# Patient Record
Sex: Female | Born: 1965 | Race: Black or African American | Hispanic: No | Marital: Married | Smoking: Never smoker
Health system: Southern US, Community
[De-identification: ages and names within clinical notes are randomized; demographics above are authoritative.]

## PROBLEM LIST (undated history)

## (undated) DIAGNOSIS — R569 Unspecified convulsions: Secondary | ICD-10-CM

## (undated) DIAGNOSIS — E0591 Thyrotoxicosis, unspecified with thyrotoxic crisis or storm: Secondary | ICD-10-CM

## (undated) DIAGNOSIS — E0590 Thyrotoxicosis, unspecified without thyrotoxic crisis or storm: Secondary | ICD-10-CM

## (undated) DIAGNOSIS — N838 Other noninflammatory disorders of ovary, fallopian tube and broad ligament: Secondary | ICD-10-CM

## (undated) DIAGNOSIS — G43019 Migraine without aura, intractable, without status migrainosus: Secondary | ICD-10-CM

## (undated) DIAGNOSIS — R42 Dizziness and giddiness: Secondary | ICD-10-CM

## (undated) DIAGNOSIS — I10 Essential (primary) hypertension: Secondary | ICD-10-CM

## (undated) DIAGNOSIS — G40909 Epilepsy, unspecified, not intractable, without status epilepticus: Principal | ICD-10-CM

## (undated) HISTORY — DX: Migraine without aura, intractable, without status migrainosus: G43.019

## (undated) HISTORY — PX: ABDOMINAL HYSTERECTOMY: SHX81

## (undated) HISTORY — PX: OVARIAN CYST SURGERY: SHX726

## (undated) HISTORY — DX: Epilepsy, unspecified, not intractable, without status epilepticus: G40.909

---

## 1997-12-19 ENCOUNTER — Inpatient Hospital Stay (HOSPITAL_COMMUNITY): Admission: AD | Admit: 1997-12-19 | Discharge: 1997-12-19 | Payer: Self-pay | Admitting: Obstetrics & Gynecology

## 1997-12-22 ENCOUNTER — Observation Stay (HOSPITAL_COMMUNITY): Admission: AD | Admit: 1997-12-22 | Discharge: 1997-12-23 | Payer: Self-pay | Admitting: Obstetrics and Gynecology

## 2000-01-28 ENCOUNTER — Emergency Department (HOSPITAL_COMMUNITY): Admission: EM | Admit: 2000-01-28 | Discharge: 2000-01-28 | Payer: Self-pay | Admitting: Emergency Medicine

## 2000-05-17 ENCOUNTER — Other Ambulatory Visit: Admission: RE | Admit: 2000-05-17 | Discharge: 2000-05-17 | Payer: Self-pay | Admitting: Family Medicine

## 2000-07-10 ENCOUNTER — Emergency Department (HOSPITAL_COMMUNITY): Admission: EM | Admit: 2000-07-10 | Discharge: 2000-07-11 | Payer: Self-pay | Admitting: Emergency Medicine

## 2002-10-09 ENCOUNTER — Other Ambulatory Visit: Admission: RE | Admit: 2002-10-09 | Discharge: 2002-10-09 | Payer: Self-pay | Admitting: Family Medicine

## 2003-04-25 ENCOUNTER — Emergency Department (HOSPITAL_COMMUNITY): Admission: EM | Admit: 2003-04-25 | Discharge: 2003-04-25 | Payer: Self-pay | Admitting: Emergency Medicine

## 2005-02-10 ENCOUNTER — Emergency Department (HOSPITAL_COMMUNITY): Admission: EM | Admit: 2005-02-10 | Discharge: 2005-02-10 | Payer: Self-pay | Admitting: Emergency Medicine

## 2005-03-13 ENCOUNTER — Encounter: Admission: RE | Admit: 2005-03-13 | Discharge: 2005-03-13 | Payer: Self-pay | Admitting: Family Medicine

## 2006-05-10 ENCOUNTER — Other Ambulatory Visit: Admission: RE | Admit: 2006-05-10 | Discharge: 2006-05-10 | Payer: Self-pay | Admitting: Family Medicine

## 2007-08-15 ENCOUNTER — Other Ambulatory Visit: Admission: RE | Admit: 2007-08-15 | Discharge: 2007-08-15 | Payer: Self-pay | Admitting: Family Medicine

## 2007-09-12 ENCOUNTER — Encounter: Admission: RE | Admit: 2007-09-12 | Discharge: 2007-09-12 | Payer: Self-pay | Admitting: Family Medicine

## 2007-12-10 ENCOUNTER — Encounter: Admission: RE | Admit: 2007-12-10 | Discharge: 2007-12-10 | Payer: Self-pay | Admitting: Family Medicine

## 2008-01-07 ENCOUNTER — Inpatient Hospital Stay (HOSPITAL_COMMUNITY): Admission: RE | Admit: 2008-01-07 | Discharge: 2008-01-11 | Payer: Self-pay | Admitting: Obstetrics and Gynecology

## 2008-01-07 ENCOUNTER — Encounter (INDEPENDENT_AMBULATORY_CARE_PROVIDER_SITE_OTHER): Payer: Self-pay | Admitting: Obstetrics and Gynecology

## 2008-09-14 ENCOUNTER — Encounter: Admission: RE | Admit: 2008-09-14 | Discharge: 2008-09-14 | Payer: Self-pay | Admitting: Family Medicine

## 2009-04-24 ENCOUNTER — Emergency Department (HOSPITAL_COMMUNITY): Admission: EM | Admit: 2009-04-24 | Discharge: 2009-04-24 | Payer: Self-pay | Admitting: Emergency Medicine

## 2009-09-17 ENCOUNTER — Encounter: Admission: RE | Admit: 2009-09-17 | Discharge: 2009-09-17 | Payer: Self-pay | Admitting: Obstetrics and Gynecology

## 2010-06-26 LAB — DIFFERENTIAL
Basophils Absolute: 0 10*3/uL (ref 0.0–0.1)
Basophils Relative: 0 % (ref 0–1)
Eosinophils Absolute: 0 10*3/uL (ref 0.0–0.7)
Eosinophils Relative: 1 % (ref 0–5)
Lymphocytes Relative: 5 % — ABNORMAL LOW (ref 12–46)
Monocytes Relative: 4 % (ref 3–12)
Neutro Abs: 8.9 10*3/uL — ABNORMAL HIGH (ref 1.7–7.7)
Neutrophils Relative %: 91 % — ABNORMAL HIGH (ref 43–77)

## 2010-06-26 LAB — COMPREHENSIVE METABOLIC PANEL
ALT: 11 U/L (ref 0–35)
AST: 17 U/L (ref 0–37)
Alkaline Phosphatase: 42 U/L (ref 39–117)
CO2: 24 mEq/L (ref 19–32)
Calcium: 8.9 mg/dL (ref 8.4–10.5)
Chloride: 104 mEq/L (ref 96–112)
Creatinine, Ser: 0.73 mg/dL (ref 0.4–1.2)
GFR calc Af Amer: >60 mL/min (ref >60)
GFR calc non Af Amer: >60 mL/min (ref >60)
Glucose, Bld: 97 mg/dL (ref 70–99)
Potassium: 3.4 mEq/L — ABNORMAL LOW (ref 3.5–5.1)
Sodium: 135 mEq/L (ref 135–145)
Total Bilirubin: 1.1 mg/dL (ref 0.3–1.2)
Total Protein: 7.8 g/dL (ref 6.0–8.3)

## 2010-06-26 LAB — CBC
HCT: 41.7 % (ref 36.0–46.0)
Hemoglobin: 13.9 g/dL (ref 12.0–15.0)
MCV: 94 fL (ref 78.0–100.0)
RBC: 4.44 MIL/uL (ref 3.87–5.11)
WBC: 9.8 10*3/uL (ref 4.0–10.5)

## 2010-07-23 ENCOUNTER — Emergency Department (HOSPITAL_COMMUNITY): Payer: 59

## 2010-07-23 ENCOUNTER — Observation Stay (HOSPITAL_COMMUNITY)
Admission: EM | Admit: 2010-07-23 | Discharge: 2010-07-25 | Disposition: A | Discharge status: Home / Self Care | Payer: 59 | Attending: General Surgery | Admitting: General Surgery

## 2010-07-23 DIAGNOSIS — R109 Unspecified abdominal pain: Secondary | ICD-10-CM | POA: Insufficient documentation

## 2010-07-23 DIAGNOSIS — R1031 Right lower quadrant pain: Principal | ICD-10-CM | POA: Insufficient documentation

## 2010-07-23 DIAGNOSIS — R110 Nausea: Secondary | ICD-10-CM | POA: Insufficient documentation

## 2010-07-23 LAB — CBC
HCT: 35.8 % — ABNORMAL LOW (ref 36.0–46.0)
Hemoglobin: 12.1 g/dL (ref 12.0–15.0)
MCHC: 33.8 g/dL (ref 30.0–36.0)
Platelets: 172 10*3/uL (ref 150–400)
RBC: 3.89 MIL/uL (ref 3.87–5.11)
RDW: 12.6 % (ref 11.5–15.5)
WBC: 5.7 10*3/uL (ref 4.0–10.5)

## 2010-07-23 LAB — URINALYSIS, ROUTINE W REFLEX MICROSCOPIC
Bilirubin Urine: NEGATIVE
Glucose, UA: NEGATIVE mg/dL
Leukocytes, UA: NEGATIVE
Nitrite: NEGATIVE
Protein, ur: NEGATIVE mg/dL
Specific Gravity, Urine: 1.01 (ref 1.005–1.030)
Urobilinogen, UA: 0.2 mg/dL (ref 0.0–1.0)

## 2010-07-23 LAB — BASIC METABOLIC PANEL
BUN: 12 mg/dL (ref 6–23)
CO2: 24 mEq/L (ref 19–32)
Calcium: 8.3 mg/dL — ABNORMAL LOW (ref 8.4–10.5)
Chloride: 99 mEq/L (ref 96–112)
Creatinine, Ser: 0.73 mg/dL (ref 0.4–1.2)
GFR calc Af Amer: >60 mL/min (ref >60)
GFR calc non Af Amer: >60 mL/min (ref >60)
Glucose, Bld: 106 mg/dL — ABNORMAL HIGH (ref 70–99)
Potassium: 3.5 mEq/L (ref 3.5–5.1)
Sodium: 137 mEq/L (ref 135–145)

## 2010-07-23 LAB — DIFFERENTIAL
Basophils Absolute: 0 10*3/uL (ref 0.0–0.1)
Eosinophils Absolute: 0.1 10*3/uL (ref 0.0–0.7)
Eosinophils Relative: 1 % (ref 0–5)
Lymphocytes Relative: 19 % (ref 12–46)
Lymphs Abs: 1.1 10*3/uL (ref 0.7–4.0)
Monocytes Relative: 10 % (ref 3–12)
Neutro Abs: 4 10*3/uL (ref 1.7–7.7)

## 2010-07-23 LAB — POCT PREGNANCY, URINE: Preg Test, Ur: NEGATIVE

## 2010-07-23 LAB — URINE MICROSCOPIC-ADD ON

## 2010-07-23 MED ORDER — IOHEXOL 300 MG/ML  SOLN
100.0000 mL | Freq: Once | INTRAMUSCULAR | Status: AC | PRN
  Administered 2010-07-23: 100 mL via INTRAVENOUS

## 2010-07-24 ENCOUNTER — Observation Stay (HOSPITAL_COMMUNITY): Payer: 59

## 2010-08-02 NOTE — H&P (Signed)
Joanna, [name]                ACCOUNT NO.:  [id number]  MEDICAL RECORD NO.:  [id number]           PATIENT TYPE:  O  LOCATION:  A214                          FACILITY:  [facility]  PHYSICIAN:  Tilford Pillar, MD      DATE OF BIRTH:  10/10/1965  DATE OF ADMISSION:  07/23/2010 DATE OF DISCHARGE:  04/16/2012LH                             HISTORY & PHYSICAL   CHIEF COMPLAINT:  Lower abdominal pain.  HISTORY OF PRESENT ILLNESS:  The patient is a 45 year old female otherwise healthy who presented to [facility] with increasing lower abdominal pain.  This has been happening over approximately the last week, but has increased over the last 24 hours. The pain is described as left lower quadrants and suprapubic, has been associated with nausea and emesis, emesis has been nonbloody.  She has had no change in bowel movements.  No melena.  No hematochezia.  She describes the pain as dull, occasionally crampy.  It is worse and exacerbated with ambulation, has not significantly changed with diet or other activities.  She has not noticed any fever or chills.  She has not had any significant anorexia, although she has been limited due to her nausea and vomiting.  PAST MEDICAL HISTORY:  None.  PAST SURGICAL HISTORY:  She is had a previous total abdominal hysterectomy and left salpingo-oophorectomy.  MEDICATIONS:  None.  ALLERGIES:  No known drug allergies.  SOCIAL HISTORY:  No tobacco, no alcohol, no recreational drug abuse.  FAMILY HISTORY:  Noncontributory.  REVIEW OF SYSTEMS:  CONSTITUTIONAL :  Unremarkable.  EYES: Unremarkable.  EARS, NOSE, AND THROAT:  Unremarkable.  RESPIRATORY: Unremarkable.  CARDIOVASCULAR:  Unremarkable.  GASTROINTESTINAL:  As per HPI.  GENITOURINARY:  Unremarkable.  MUSCULOSKELETAL:  Unremarkable. NEURO, ENDOCRINE, AND SKIN:  All unremarkable.  PHYSICAL EXAMINATION:  VITAL SIGNS:  Temperature 98.4, heart rate 85, respirations 18, blood  pressure 125/81.  She is 99% O2 saturation on room air. GENERAL:  The patient is lying in a supine position on the hospital bed. She is not in any acute distress, but she appears uncomfortable.  She is alert and oriented x3.  She is pleasant.  She is again not in any acute distress. HEENT:  Scalp no deformities or masses. EYES:  Pupils equal, round, and reactive.  Extraocular movements are intact.  No scleral icterus.  No conjunctival pallor is noted.  Oral mucosa is pink.  Normal occlusion. NECK:  Trachea is midline.  No cervical lymphadenopathy. PULMONARY:  Unlabored respiration.  She is clear to auscultation bilaterally. CARDIOVASCULAR:  Regular rate and rhythm.  No murmurs or gallops.  She has 2+ radial and femoral pulses bilaterally. ABDOMEN:  Positive bowel sounds.  Abdomen is soft, flat.  She has moderate suprapubic and left-sided abdominal pain, slight right-sided abdominal pain.  There is no rebound.  There is no Rovsing's sign.  No significant voluntary guarding.  No hernias or masses are apparent. EXTREMITIES:  Warm and dry.  PERTINENT LABORATORY AND RADIOGRAPHIC STUDIES:  CBC, white blood cell count 5.7, hemoglobin 12.1, hematocrit 35.8, platelets 172.  Basic metabolic panel is within normal limits.  Sodium 137, potassium 3.5, chloride 99, bicarb 24, BUN 12, creatinine 0.73.  Urinalysis was within normal limits.  CT of the abdomen and pelvis demonstrates no evidence any free air or free fluid.  There is a notable mass within the right lower quadrant initially interpreted by the radiologist as early appendicitis or potential appendiceal mucocele.  On further discussion with the on-call radiologist, I determine this is likely the patient's right ovary, and the appendix is noted and is noted to be normal in appearance.  ASSESSMENT AND PLAN:  Abdominal pain, unspecified.  Plan at this point is to admit the patient, continue her initially on bowel rest, continue IV fluid  resuscitation with IV fluid hydration.  We will continue on pain medications and we will closely monitor the patient for continued workup for her abdominal pain.  Additionally, the patient had some stools in the colon, and GI washout will be performed to see if some of her symptomatology is contributed to obstipation.     Tilford Pillar, MD     BZ/MEDQ  D:  07/25/2010  T:  07/25/2010  Job:  [id number]  Electronically Signed by Tilford Pillar MD on 08/02/2010 01:55:05 PM

## 2010-08-02 NOTE — Discharge Summary (Signed)
  NAMEKENSLEIGH, GATES                ACCOUNT NO.:  [id number]  MEDICAL RECORD NO.:  [id number]           PATIENT TYPE:  O  LOCATION:  A214                          FACILITY:  [facility]  PHYSICIAN:  Tilford Pillar, MD      DATE OF BIRTH:  [birth year]  DATE OF ADMISSION:  07/23/2010 DATE OF DISCHARGE:  04/16/2012LH                              DISCHARGE SUMMARY   ADMISSION DIAGNOSES:  Abdominal pain, unspecified.  DISCHARGE DIAGNOSES:  Resolving abdominal pain.  PROCEDURES:  None.  ADMITTING SURGEON:  Tilford Pillar, MD  DISPOSITION:  Home.  BRIEF H AND P:  Please see admission history of physical for the complete H and P.  The patient is a 45 year old female who presented to [facility] with lower abdominal pain, left greater than right with initial radiographic interpretation suggesting appendiceal origin. She was admitted for continued management and intervention.  HOSPITAL COURSE:  The patient was admitted on July 23, 2010.  She was continued on bowel rest.  She was continued on IV fluid hydration and pain control.  Her symptomatology did not clinically indicate an appendiceal issue, discussion with the on-call radiologist by myself reviewing the CT evaluation was carried out.  After the discussion, we agreed that the findings were likely the patient's right ovary and not associated with any appendiceal origin.  Based on this and some improvement after initial bowel rest and GI washout, I did start the patient back on a clear diet.  This was advanced to a regular diet as tolerated.  She does have moderate amount of discomfort and pain with ambulation similar to her presenting features; however, no catastrophic intra-abdominal findings have been apparent.  Ultrasound was obtained, which again confirmed that the structure in the right lower quadrant was indeed the patient's right ovary and as the patient's symptomatology has been very similar to her symptomatology she had  prior to her removal of her left ovary, I do think that followup as an outpatient with the patient's OB/GYN would be prudent.  This was discussed with the patient and the patient understands and is agreeable to this.  At this time, we will plan to discharge the patient with followup in the next 24 hours with her OB/GYN.  She does understand that should her symptomatology worsen in the interim, she is to return to the hospital for additional evaluation.  DISCHARGE INSTRUCTIONS:  The patient is to increase activity as tolerated.  She may resume normal function as tolerated.  She may resume a normal diet.  She is to return to see her OB/GYN, Dr. Richardson Dopp at [facility] at 2 o'clock on July 26, 2010.  She is to call my office should she have any questions or concerns or problems.  DISCHARGE MEDICATIONS:  Please see the discharge reconciliation sheet for all discharge medications.     Tilford Pillar, MD     BZ/MEDQ  D:  07/25/2010  T:  07/26/2010  Job:  [id number]  Electronically Signed by Tilford Pillar MD on 08/02/2010 01:55:01 PM

## 2010-08-23 NOTE — Op Note (Signed)
NAMEJAYLYNE, BREESE                ACCOUNT NO.:  [id number]   MEDICAL RECORD NO.:  [id number]          PATIENT TYPE:  INP   LOCATION:  9302                          FACILITY:  [facility]   PHYSICIAN:  Gerald Leitz, MD          DATE OF BIRTH:  Mar 06, 1966   DATE OF PROCEDURE:  DATE OF DISCHARGE:                               OPERATIVE REPORT   PREOPERATIVE DIAGNOSES:  1. Menorrhagia.  2. Complex left ovarian cyst.  3. Suspected adenomyosis.   POSTOPERATIVE DIAGNOSES:  1. Menorrhagia.  2. Complex left ovarian cyst.  3. Suspected adenomyosis.  4. Extensive pelvic adhesive disease.   PROCEDURE:  Extensive lysis of adhesions, total abdominal hysterectomy,  and left salpingo-oophorectomy.   SURGEON:  Gerald Leitz, MD   ASSISTANT:  Artist Pais, MD   ANESTHESIA:  General.   FINDINGS:  Adhesions of the uterus to the anterior abdominal wall,  complex cyst on the left ovary, and adhesions of the bladder to the  uterus.   SPECIMEN:  Left fallopian tube and ovary, uterus, and cervix.   DISPOSITION OF SPECIMEN:  Pathology.   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   ESTIMATED BLOOD LOSS:  250 mL.   URINE OUTPUT:  200 mL.   COMPLICATIONS:  None.   PROCEDURE:  The patient was taken to the operating room where she was  placed under general anesthesia.  She was prepped and draped in the  usual sterile fashion.  A Pfannenstiel skin incision was made with a  scalpel and carried down to the underlying layer of fascia.  The fascia  was incised in the midline, and incision was extended laterally with  Mayo scissors.  Superior aspect of the fascial incision was grasped with  Kocher clamps, elevated, and underlying rectus muscles were dissected  off using scalpel and Mayo scissors.  Rectus muscles were densely  adhered in the midline.  They were separated using scalpel and Mayo  scissors.  The peritoneum was identified and entered sharply with  Metzenbaum  scissors.  The patient was noted to have adhesions of the  uterus to the anterior abdominal wall.  This was carefully dissected off  using the electrocautery and Metzenbaum scissors.  Adhesiolysis took  approximately an hour of the case.  Once adhesions were removed and the  uterus was adequately mobilized, the Balfour retractor was placed into  the peritoneal cavity.  Bowel was packed away with moist laparotomy  sponges.  The cornu of the uterus were grasped with Kelly clamps, and  the uterus was elevated.  The round ligaments were identified  bilaterally.  They were suture ligated with 0 Vicryl and then  transected.  The left infundibulopelvic ligament was identified.  The  left ureter was identified.  Once the ureter was identified, the left  infundibulopelvic ligament was clamped with a Heaney clamp, transected,  and suture ligated with a free tie of 0 Vicryl followed by a suture  ligature of 0 Vicryl.  The left ovary and fallopian tube were excised  and sent to pathology  for frozen section.  The right utero-ovarian  ligament was clamped with a Heaney clamp, transected, and suture ligated  with free tie of 0 Vicryl followed by a suture ligature of 0 Vicryl.  The bladder was densely adhesed to the lower uterine segment.  This was  dissected off using Metzenbaum scissors.  The uterine arteries were then  skeletonized bilaterally, clamped with Zeppelin clamps, transected,  suture ligated with 0 Vicryl.  Cardinal ligaments were then clamped with  Heaney clamps, transected, and suture ligated with 0 Vicryl.  Uterosacral ligaments were clamped with Heaney clamps, transected, and  suture ligated with 0 Vicryl bilaterally.  The cervix was amputated from  the anterior aspect of the vagina using Mayo scissors.  The vaginal cuff  was repaired with 0 Vicryl in a running locked fashion.  Angle suture of  0 Vicryl was placed bilaterally.  The abdomen was then copiously  irrigated.  Excellent  hemostasis was noted.  Methylene blue was  infiltrated into the bladder and no defects were noted.  Again, the  abdomen was copiously irrigated and excellent hemostasis was noted.  All  instruments were removed from the abdomen.  The fascia was  reapproximated with 0 PDS.  The skin was closed with staples.  Sponge,  lap, and needle counts were correct x2.  The patient was taken to the  recovery room, awake and in stable condition.      Gerald Leitz, MD  Electronically Signed     TC/MEDQ  D:  01/07/2008  T:  01/08/2008  Job:  [id number]

## 2010-08-23 NOTE — Discharge Summary (Signed)
NAMESHAKARI, QAZI                ACCOUNT NO.:  [id number]   MEDICAL RECORD NO.:  [id number]          PATIENT TYPE:  INP   LOCATION:  9302                          FACILITY:  [facility]   PHYSICIAN:  Gerald Leitz, MD          DATE OF BIRTH:  03/05/1966   DATE OF ADMISSION:  01/07/2008  DATE OF DISCHARGE:  01/11/2008                               DISCHARGE SUMMARY   ADMISSION DIAGNOSES:  1. Menorrhagia.  2. Complex ovarian cyst.  3. Dysmenorrhea.   DISCHARGE DIAGNOSES:  1. Menorrhagia.  2. Complex ovarian cyst.  3. Dysmenorrhea.  4. Status post total abdominal hysterectomy.  5. Extensive lysis of adhesions.  6. Left salpingo-oophorectomy.  7. Pelvic adhesive disease.   BRIEF HOSPITAL COURSE:  The patient was admitted on January 07, 2008,  after undergoing a total abdominal hysterectomy, left salpingo-  oophorectomy, and lysis of adhesions secondary to menorrhagia,  dysmenorrhea, and complex ovarian cyst.  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  She did well  postoperatively.  However, her postoperative course was complicated by  an ileus.  She received IV fluids and bowel rest with resolution of the  ileus.  Her hemoglobin on postop day #1 was 10.6.  She is discharged in  stable and improved condition on January 11, 2008, on the following  medications:  Motrin and Percocet.  She will follow up at [facility] with Dr. Richardson Dopp in 2 weeks.      Gerald Leitz, MD  Electronically Signed     TC/MEDQ  D:  01/11/2008  T:  01/11/2008  Job:  [id number]

## 2010-08-23 NOTE — H&P (Signed)
NAMEBABY, GIEGER NO.:  [id number]   MEDICAL RECORD NO.:  [id number]         PATIENT TYPE:  WAMB   LOCATION:                                FACILITY:  [facility]   PHYSICIAN:  Gerald Leitz, MD          DATE OF BIRTH:  January 20, 1966   DATE OF ADMISSION:  01/07/2008  DATE OF DISCHARGE:                              HISTORY & PHYSICAL   DATE OF SURGERY:  January 07, 2008.   HISTORY OF PRESENT ILLNESS:  This is a 45 year old G5, P2-0-3-2 with  menorrhagia, suspected adenomyosis, and a complex left ovarian cyst.  She had an ultrasound performed on December 10, 2007.  She has the  uterus that measured 14.1 cm x 6.3 cm.  Left ovary contained a complex  cyst with color flow around the periphery measuring 6.1 cm.  CA-125 was  negative at 18.9.  The patient also has a recent history of pelvic  inflammatory disease that was treated with Levaquin 500 mg daily and  Flagyl 500 mg daily.  This was diagnosed and treated on December 10, 2007, and it has completely resolved.   PAST OB HISTORY:  Cesarean section x1, spontaneous vaginal delivery x1,  miscarriage x1, and missed abortion x2.   GYN HISTORY:  Menarche at the age of 45.  Contraception, she has  vasectomy.  Last Pap smear was on Aug 15, 2007, and this was normal.  No  history of abnormal Pap smears.  No history of sexually transmitted  diseases.  PIP was diagnosed, and GC and Chlamydia were negative on  December 12, 2007.   PAST MEDICAL HISTORY:  Negative.   PAST SURGICAL HISTORY:  Laparoscopic right vasectomy in 1994.   MEDICATIONS CURRENTLY:  None.   ALLERGIES:  No known drug allergies.   HEALTH MAINTENANCE:  1. Pap smear, Aug 15, 2007, normal  2. Mammograms, September 12, 2007, normal.   SOCIAL HISTORY:  The patient is married.  She denies tobacco, alcohol,  or illicit drug use.   FAMILY HISTORY:  Negative for breast, uterine, or colon cancer.   REVIEW OF SYSTEMS:  Negative except as stated in the history of current  illness.   PHYSICAL EXAMINATION:  VITAL SIGNS:  Blood pressure 120/84, weight 126-  1/2 pounds, height 63-3/4 inches.  CARDIOVASCULAR:  Regular rate and rhythm.  LUNGS:  Clear to auscultation bilaterally.  ABDOMEN:  Soft, nontender, and nondistended.  No masses.  PELVIC:  Normal external female genitalia.  No vulvar, vaginal, or  cervical lesions noted.  Bimanual exam reveals left adnexal fullness.  There was no cervical motion tenderness.  Uterus was approximately 14-  week size.  EXTREMITIES:  No clubbing, cyanosis, or edema.   ASSESSMENT AND PLAN:  A 45 year old with menorrhagia, possible  adenomyosis, and complex left ovarian cyst, who desires therapy with  total abdominal hysterectomy, left ovarian cystectomy, and possible  bilateral salpingo-oophorectomy.  Risks, benefits, and alternatives of  the surgery were discussed with the patient including, but not limited  to infection, bleeding, damage to bowel, bladder, or surrounding organs  with  the need for further surgery.  Risk of malignancy was discussed  with the patient.  If malignancy is found, this may require further  staging or staging at a later date.  She voiced understanding and  desires to proceed with total abdominal hysterectomy, left ovarian  cystectomy, and possible bilateral salpingo-oophorectomy.      Gerald Leitz, MD  Electronically Signed     TC/MEDQ  D:  12/26/2007  T:  12/27/2007  Job:  [id number]

## 2010-09-01 ENCOUNTER — Other Ambulatory Visit: Payer: Self-pay | Admitting: Family Medicine

## 2010-09-01 DIAGNOSIS — Z1231 Encounter for screening mammogram for malignant neoplasm of breast: Secondary | ICD-10-CM

## 2010-09-09 ENCOUNTER — Other Ambulatory Visit: Payer: Self-pay | Admitting: Family Medicine

## 2010-09-09 DIAGNOSIS — M545 Low back pain: Secondary | ICD-10-CM

## 2010-09-10 ENCOUNTER — Ambulatory Visit
Admission: RE | Admit: 2010-09-10 | Discharge: 2010-09-10 | Disposition: A | Discharge status: Home / Self Care | Payer: 59 | Source: Ambulatory Visit | Attending: Family Medicine | Admitting: Family Medicine

## 2010-09-10 DIAGNOSIS — M545 Low back pain: Secondary | ICD-10-CM

## 2010-09-19 ENCOUNTER — Ambulatory Visit
Admission: RE | Admit: 2010-09-19 | Discharge: 2010-09-19 | Disposition: A | Discharge status: Home / Self Care | Payer: 59 | Source: Ambulatory Visit | Attending: Family Medicine | Admitting: Family Medicine

## 2010-09-19 DIAGNOSIS — Z1231 Encounter for screening mammogram for malignant neoplasm of breast: Secondary | ICD-10-CM

## 2011-01-09 LAB — BASIC METABOLIC PANEL
BUN: 9
CO2: 25
Chloride: 104
Creatinine, Ser: 0.74
Glucose, Bld: 93

## 2011-01-09 LAB — TYPE AND SCREEN
ABO/RH(D): B POS
Antibody Screen: NEGATIVE

## 2011-01-09 LAB — PREGNANCY, URINE: Preg Test, Ur: NEGATIVE

## 2011-01-09 LAB — CBC
HCT: 31.5 — ABNORMAL LOW
MCHC: 32.8
MCV: 94.3
MCV: 94.7
Platelets: 197
RBC: 3.33 — ABNORMAL LOW
RDW: 13.1
WBC: 11 — ABNORMAL HIGH
WBC: 6

## 2011-01-09 LAB — URINALYSIS, ROUTINE W REFLEX MICROSCOPIC
Bilirubin Urine: NEGATIVE
Ketones, ur: 15 — AB
Leukocytes, UA: NEGATIVE
Nitrite: NEGATIVE
Protein, ur: NEGATIVE
Urobilinogen, UA: 0.2

## 2011-01-09 LAB — ABO/RH: ABO/RH(D): B POS

## 2011-01-10 LAB — BASIC METABOLIC PANEL
BUN: 3 — ABNORMAL LOW
CO2: 28
Chloride: 105
Creatinine, Ser: 0.67
Potassium: 3.6

## 2011-04-30 ENCOUNTER — Emergency Department (HOSPITAL_COMMUNITY): Payer: 59

## 2011-04-30 ENCOUNTER — Encounter (HOSPITAL_COMMUNITY): Payer: Self-pay | Admitting: *Deleted

## 2011-04-30 ENCOUNTER — Other Ambulatory Visit: Payer: Self-pay

## 2011-04-30 ENCOUNTER — Emergency Department (HOSPITAL_COMMUNITY)
Admission: EM | Admit: 2011-04-30 | Discharge: 2011-04-30 | Disposition: A | Discharge status: Home / Self Care | Payer: 59 | Attending: Emergency Medicine | Admitting: Emergency Medicine

## 2011-04-30 DIAGNOSIS — J45909 Unspecified asthma, uncomplicated: Secondary | ICD-10-CM | POA: Insufficient documentation

## 2011-04-30 DIAGNOSIS — Z9079 Acquired absence of other genital organ(s): Secondary | ICD-10-CM | POA: Insufficient documentation

## 2011-04-30 DIAGNOSIS — I10 Essential (primary) hypertension: Secondary | ICD-10-CM | POA: Insufficient documentation

## 2011-04-30 DIAGNOSIS — R0781 Pleurodynia: Secondary | ICD-10-CM

## 2011-04-30 DIAGNOSIS — R071 Chest pain on breathing: Secondary | ICD-10-CM | POA: Insufficient documentation

## 2011-04-30 DIAGNOSIS — R569 Unspecified convulsions: Secondary | ICD-10-CM

## 2011-04-30 DIAGNOSIS — R55 Syncope and collapse: Secondary | ICD-10-CM

## 2011-04-30 DIAGNOSIS — R0600 Dyspnea, unspecified: Secondary | ICD-10-CM

## 2011-04-30 DIAGNOSIS — R0602 Shortness of breath: Secondary | ICD-10-CM | POA: Insufficient documentation

## 2011-04-30 HISTORY — DX: Essential (primary) hypertension: I10

## 2011-04-30 LAB — COMPREHENSIVE METABOLIC PANEL
BUN: 13 mg/dL (ref 6–23)
CO2: 24 mEq/L (ref 19–32)
Calcium: 10.4 mg/dL (ref 8.4–10.5)
Creatinine, Ser: 0.7 mg/dL (ref 0.50–1.10)
GFR calc Af Amer: >90 mL/min (ref >90)
GFR calc non Af Amer: >90 mL/min (ref >90)
Glucose, Bld: 117 mg/dL — ABNORMAL HIGH (ref 70–99)
Sodium: 137 mEq/L (ref 135–145)
Total Protein: 8.3 g/dL (ref 6.0–8.3)

## 2011-04-30 LAB — POCT I-STAT TROPONIN I: Troponin i, poc: 0 ng/mL (ref 0.00–0.08)

## 2011-04-30 LAB — PROTIME-INR
INR: 0.99 (ref 0.00–1.49)
Prothrombin Time: 13.3 seconds (ref 11.6–15.2)

## 2011-04-30 LAB — CBC
HCT: 38.7 % (ref 36.0–46.0)
MCH: 30.6 pg (ref 26.0–34.0)
MCV: 89.8 fL (ref 78.0–100.0)
Platelets: 190 10*3/uL (ref 150–400)
RBC: 4.31 MIL/uL (ref 3.87–5.11)
WBC: 6.4 10*3/uL (ref 4.0–10.5)

## 2011-04-30 LAB — DIFFERENTIAL
Eosinophils Absolute: 0 10*3/uL (ref 0.0–0.7)
Eosinophils Relative: 0 % (ref 0–5)
Lymphocytes Relative: 18 % (ref 12–46)
Lymphs Abs: 1.2 10*3/uL (ref 0.7–4.0)
Monocytes Absolute: 0.5 10*3/uL (ref 0.1–1.0)

## 2011-04-30 LAB — URINALYSIS, ROUTINE W REFLEX MICROSCOPIC
Bilirubin Urine: NEGATIVE
Leukocytes, UA: NEGATIVE
Nitrite: NEGATIVE
Specific Gravity, Urine: 1.025 (ref 1.005–1.030)
Urobilinogen, UA: 0.2 mg/dL (ref 0.0–1.0)
pH: 5.5 (ref 5.0–8.0)

## 2011-04-30 LAB — APTT: aPTT: 34 seconds (ref 24–37)

## 2011-04-30 LAB — URINE MICROSCOPIC-ADD ON

## 2011-04-30 MED ORDER — ONDANSETRON HCL 4 MG/2ML IJ SOLN
4.0000 mg | Freq: Once | INTRAMUSCULAR | Status: AC
  Administered 2011-04-30: 4 mg via INTRAVENOUS
  Filled 2011-04-30: qty 2

## 2011-04-30 MED ORDER — SODIUM CHLORIDE 0.9 % IV SOLN
INTRAVENOUS | Status: DC
  Administered 2011-04-30: 10:00:00 via INTRAVENOUS

## 2011-04-30 MED ORDER — IOHEXOL 350 MG/ML SOLN
100.0000 mL | Freq: Once | INTRAVENOUS | Status: AC | PRN
  Administered 2011-04-30: 100 mL via INTRAVENOUS

## 2011-04-30 MED ORDER — KETOROLAC TROMETHAMINE 30 MG/ML IJ SOLN
30.0000 mg | Freq: Once | INTRAMUSCULAR | Status: AC
  Administered 2011-04-30: 30 mg via INTRAVENOUS
  Filled 2011-04-30: qty 1

## 2011-04-30 MED ORDER — SODIUM CHLORIDE 0.9 % IV BOLUS (SEPSIS)
1000.0000 mL | Freq: Once | INTRAVENOUS | Status: AC
  Administered 2011-04-30: 1000 mL via INTRAVENOUS

## 2011-04-30 NOTE — ED Provider Notes (Signed)
Scribed for Ward Givens, MD, the patient was seen in room APA04/APA04 . This chart was scribed by Ellie Lunch.   CSN: [id number]  Arrival date & time 04/30/11  4098   None     Chief Complaint  Patient presents with  . Loss of Consciousness  . Hypertension    (Consider location/radiation/quality/duration/timing/severity/associated sxs/prior treatment) The history is provided by the patient.   Pt seen at 8:45 AM  [name] is a 46 y.o. female who presents to the Emergency Department complaining of 2 syncopal episodes. Pt, at baseline this morning, went into work at [employer].  Within an hour of arriving, she began to feel hot, lightheaded and had a syncopal episode while standing. Pt is unsure of the duration of the syncopal epsiode, and says the next thing she remembers is when EMS arrived. Pt was told she had had a seizure during the syncopal episode. EMS recommended Pt go to [facility] because Pt was hypertensive at 170 systolic. Pt declined and returned to work after resting for 15 minutes. Within 20 minutes of returning to work Pt began to feel hot and lightheaded again and had a second syncopal episode while sitting and states it wasn't as long and she remembers people telling her to wake up. Pt is unsure of duration of second episode. Pt did not have a seizure during the second episode. Pt also complains of developing a sharp intermittent pleuritic chest pain following the second syncopal episode. Each episode of chest pain lasts a few seconds and is associated with SOB.  Pt denies any leg pain or leg swelling. She denies diaphoreses, headache but has tingling in her fingers and toes.  Pt denies taking any hormonal medications. Denies recent travel. Pt reports a h/o seizures as a young girl and young adult, but has not had a seizure since the birth of her first child 17 years ago. Seizures did not have a known cause. Pt's brother also has h.o seizures. Pt reports no history of chronic  hypertension. Pt has one previous episode of hypertension following the birth of her son 17 years ago.  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  No prior episodes of syncope.   PCP Dr [facility]  Past Medical History  Diagnosis Date  . Hypertension never treated with medications   . Asthma   seizures  Past Surgical History  Procedure Date  . Abdominal hysterectomy     History reviewed. No pertinent family history. Family history of diabetes,asthma and hypertension. No family history of blood clots.  Brother has seizures  History  Substance Use Topics  . Smoking status: Never Smoker   . Smokeless tobacco: Not on file  . Alcohol Use: No  employed Lives with spouse   Review of Systems  Constitutional: Negative for fever.  Cardiovascular: Negative for leg swelling.  Neurological: Positive for seizures, syncope and light-headedness.  All other systems reviewed and are negative.   Allergies  Iohexol  Home Medications   Current Outpatient Rx  Name Route Sig Dispense Refill  . COD LIVER OIL 1000 MG PO CAPS Oral Take 2 capsules by mouth daily.    . ETODOLAC 400 MG PO TABS Oral Take 400 mg by mouth 2 (two) times daily as needed. For back pain    . FERROUS SULFATE 325 (  65 FE) MG PO TABS Oral Take 325 mg by mouth every other day.    . ADULT MULTIVITAMIN W/MINERALS CH Oral Take 1 tablet by mouth daily.      BP 163/88  Pulse 81  Temp(Src) 98.3 F (36.8 C) (Oral)  Resp 16  Ht 5\' 4"  (1.626 m)  Wt 125 lb (56.7 kg)  BMI 21.46 kg/m2  SpO2 100% Vital signs normal except mild hypertension   Physical Exam  Nursing note and vitals reviewed. Constitutional: She is oriented to person, place, and time. She appears well-developed and well-nourished.  Non-toxic appearance. She does not appear ill. No distress.  HENT:  Head:  Normocephalic and atraumatic.  Right Ear: External ear normal.  Left Ear: External ear normal.  Nose: Nose normal. No mucosal edema or rhinorrhea.  Mouth/Throat: Oropharynx is clear and moist and mucous membranes are normal. No dental abscesses or uvula swelling.  Eyes: Conjunctivae and EOM are normal. Pupils are equal, round, and reactive to light.  Neck: Normal range of motion and full passive range of motion without pain. Neck supple.  Cardiovascular: Normal rate, regular rhythm and normal heart sounds.  Exam reveals no gallop and no friction rub.   No murmur heard. Pulmonary/Chest: Effort normal and breath sounds normal. No respiratory distress. She has no wheezes. She has no rhonchi. She has no rales. She exhibits no tenderness and no crepitus.         Area of pain indicated, nontender to palpation  Abdominal: Soft. Normal appearance and bowel sounds are normal. She exhibits no distension. There is no tenderness. There is no rebound and no guarding.  Musculoskeletal: Normal range of motion. She exhibits no edema and no tenderness.       Moves all extremities well.   Neurological: She is alert and oriented to person, place, and time. She has normal strength. No cranial nerve deficit.  Skin: Skin is warm, dry and intact. No rash noted. No erythema. No pallor.  Psychiatric: She has a normal mood and affect. Her speech is normal and behavior is normal. Her mood appears not anxious.    ED Course  Procedures (including critical care time)   ED MEDICATION Medications  0.9 %  sodium chloride infusion (  Intravenous New Bag/Given 04/30/11 1029)  sodium chloride 0.9 % bolus 1,000 mL (1000 mL Intravenous Given 04/30/11 0914)  ondansetron (ZOFRAN) injection 4 mg (4 mg Intravenous Given 04/30/11 0953)  iohexol (OMNIPAQUE) 350 MG/ML injection 100 mL (100 mL Intravenous Contrast Given 04/30/11 0948)  ketorolac (TORADOL) 30 MG/ML injection 30 mg (30 mg Intravenous Given 04/30/11 1029)   10:41 AM Pt  recheck. Reviewed negative imaging results. Discussed plan to take one more troponin. If second troponin is normal, plan to discharge. Pt reports improvement of chest pain with pain medication.   Pt's BP improved without medication.    DIAGNOSTIC STUDIES: Oxygen Saturation is 100% on room air, normal by my interpretation.    COORDINATION OF CARE:  . Results for orders placed during the hospital encounter of 04/30/11  CBC      Component Value Range   WBC 6.4  4.0 - 10.5 (K/uL)   RBC 4.31  3.87 - 5.11 (MIL/uL)   Hemoglobin 13.2  12.0 - 15.0 (g/dL)   HCT 16.1  09.6 - 04.5 (%)   MCV 89.8  78.0 - 100.0 (fL)   MCH 30.6  26.0 - 34.0 (pg)   MCHC 34.1  30.0 - 36.0 (g/dL)   RDW 40.9  81.1 -  15.5 (%)   Platelets 190  150 - 400 (K/uL)  DIFFERENTIAL      Component Value Range   Neutrophils Relative 73  43 - 77 (%)   Neutro Abs 4.7  1.7 - 7.7 (K/uL)   Lymphocytes Relative 18  12 - 46 (%)   Lymphs Abs 1.2  0.7 - 4.0 (K/uL)   Monocytes Relative 8  3 - 12 (%)   Monocytes Absolute 0.5  0.1 - 1.0 (K/uL)   Eosinophils Relative 0  0 - 5 (%)   Eosinophils Absolute 0.0  0.0 - 0.7 (K/uL)   Basophils Relative 0  0 - 1 (%)   Basophils Absolute 0.0  0.0 - 0.1 (K/uL)  COMPREHENSIVE METABOLIC PANEL      Component Value Range   Sodium 137  135 - 145 (mEq/L)   Potassium 4.0  3.5 - 5.1 (mEq/L)   Chloride 104  96 - 112 (mEq/L)   CO2 24  19 - 32 (mEq/L)   Glucose, Bld 117 (*) 70 - 99 (mg/dL)   BUN 13  6 - 23 (mg/dL)   Creatinine, Ser 4.54  0.50 - 1.10 (mg/dL)   Calcium 09.8  8.4 - 10.5 (mg/dL)   Total Protein 8.3  6.0 - 8.3 (g/dL)   Albumin 4.7  3.5 - 5.2 (g/dL)   AST 15  0 - 37 (U/L)   ALT 7  0 - 35 (U/L)   Alkaline Phosphatase 53  39 - 117 (U/L)   Total Bilirubin 0.4  0.3 - 1.2 (mg/dL)   GFR calc non Af Amer >90  >90 (mL/min)   GFR calc Af Amer >90  >90 (mL/min)  URINALYSIS, ROUTINE W REFLEX MICROSCOPIC      Component Value Range   Color, Urine YELLOW  YELLOW    APPearance CLEAR  CLEAR     Specific Gravity, Urine 1.025  1.005 - 1.030    pH 5.5  5.0 - 8.0    Glucose, UA NEGATIVE  NEGATIVE (mg/dL)   Hgb urine dipstick TRACE (*) NEGATIVE    Bilirubin Urine NEGATIVE  NEGATIVE    Ketones, ur NEGATIVE  NEGATIVE (mg/dL)   Protein, ur NEGATIVE  NEGATIVE (mg/dL)   Urobilinogen, UA 0.2  0.0 - 1.0 (mg/dL)   Nitrite NEGATIVE  NEGATIVE    Leukocytes, UA NEGATIVE  NEGATIVE   APTT      Component Value Range   aPTT 34  24 - 37 (seconds)  PROTIME-INR      Component Value Range   Prothrombin Time 13.3  11.6 - 15.2 (seconds)   INR 0.99  0.00 - 1.49   URINE MICROSCOPIC-ADD ON      Component Value Range   Squamous Epithelial / LPF RARE  RARE    WBC, UA 0-2  <3 (WBC/hpf)   RBC / HPF 0-2  <3 (RBC/hpf)  POCT I-STAT TROPONIN I      Component Value Range   Troponin i, poc 0.01  0.00 - 0.08 (ng/mL)   Comment 3           POCT I-STAT TROPONIN I      Component Value Range   Troponin i, poc 0.00  0.00 - 0.08 (ng/mL)   Comment 3            Laboratory interpretation all normal   Ct Head Wo Contrast  04/30/2011  *RADIOLOGY REPORT*  Clinical Data: Syncope  CT HEAD WITHOUT CONTRAST  Technique:  Contiguous axial images were obtained from the base of  the skull through the vertex without contrast.  Comparison: None.  Findings: No evidence of parenchymal hemorrhage or extra-axial fluid collection. No mass lesion, mass effect, or midline shift.  No CT evidence of acute infarction.  Cerebral volume is age appropriate.  No ventriculomegaly.  The visualized paranasal sinuses are essentially clear. The mastoid air cells are unopacified.  No evidence of calvarial fracture.  IMPRESSION: Normal head CT.  Original Report Authenticated By: Charline Bills, M.D.   Ct Angio Chest W/cm &/or Wo Cm  04/30/2011  *RADIOLOGY REPORT*  Clinical Data: Syncope  CT ANGIOGRAPHY CHEST  Technique:  Multidetector CT imaging of the chest using the standard protocol during bolus administration of intravenous contrast. Multiplanar  reconstructed images including MIPs were obtained and reviewed to evaluate the vascular anatomy.  Contrast: OMNIPAQUE IOHEXOL 350 MG/ML IV SOLN  Comparison: None.  Findings: No evidence of pulmonary embolism.  The lungs are clear.  No suspicious pulmonary nodules. No pleural effusion or pneumothorax.  Visualized right thyroid is mildly heterogeneous/nodular.  Heart is normal in size.  No pericardial effusion.  Visualized upper abdomen is unremarkable.  Very mild degenerative changes of the thoracic spine.  IMPRESSION: No evidence of pulmonary embolism.  No evidence of acute cardiopulmonary disease.  Original Report Authenticated By: Charline Bills, M.D.     Date: 04/30/2011  Rate: 72  Rhythm: normal sinus rhythm  QRS Axis: normal  Intervals: normal  ST/T Wave abnormalities: normal  Conduction Disutrbances:none  Narrative Interpretation:   Old EKG Reviewed: none available  Diagnoses that have been ruled out:  None  Diagnoses that are still under consideration:  None  Final diagnoses:  Syncope  Seizure  Sleep deprivation seizure  Pleuritic chest pain  Dyspnea   Plan discharge   MDM  I personally performed the services described in this documentation, which was scribed in my presence. The recorded information has been reviewed and considered. Devoria Albe, MD, Armando Gang      Ward Givens, MD 04/30/11 1149

## 2011-04-30 NOTE — ED Notes (Signed)
Pt states she was at work this am and became hot and dizzy. Pt states she then passed out. Pt does not know length of time but states she woke and ambulance was there.

## 2011-04-30 NOTE — ED Notes (Signed)
Family at bedside. Patient states she is having 0/10 pain and does not need anything at this time.

## 2011-07-14 ENCOUNTER — Other Ambulatory Visit: Payer: Self-pay | Admitting: Family Medicine

## 2011-07-14 DIAGNOSIS — R42 Dizziness and giddiness: Secondary | ICD-10-CM

## 2011-07-14 DIAGNOSIS — R51 Headache: Secondary | ICD-10-CM

## 2011-07-18 ENCOUNTER — Ambulatory Visit
Admission: RE | Admit: 2011-07-18 | Discharge: 2011-07-18 | Disposition: A | Discharge status: Home / Self Care | Payer: 59 | Source: Ambulatory Visit | Attending: Family Medicine | Admitting: Family Medicine

## 2011-07-18 DIAGNOSIS — R51 Headache: Secondary | ICD-10-CM

## 2011-07-18 DIAGNOSIS — R42 Dizziness and giddiness: Secondary | ICD-10-CM

## 2011-09-18 ENCOUNTER — Other Ambulatory Visit: Payer: Self-pay | Admitting: Family Medicine

## 2011-09-18 DIAGNOSIS — Z1231 Encounter for screening mammogram for malignant neoplasm of breast: Secondary | ICD-10-CM

## 2011-09-21 ENCOUNTER — Ambulatory Visit
Admission: RE | Admit: 2011-09-21 | Discharge: 2011-09-21 | Disposition: A | Discharge status: Home / Self Care | Payer: 59 | Source: Ambulatory Visit | Attending: Family Medicine | Admitting: Family Medicine

## 2011-09-21 DIAGNOSIS — Z1231 Encounter for screening mammogram for malignant neoplasm of breast: Secondary | ICD-10-CM

## 2013-10-08 ENCOUNTER — Other Ambulatory Visit: Payer: Self-pay

## 2013-10-08 DIAGNOSIS — Z1231 Encounter for screening mammogram for malignant neoplasm of breast: Secondary | ICD-10-CM

## 2013-11-24 ENCOUNTER — Ambulatory Visit
Admission: RE | Admit: 2013-11-24 | Discharge: 2013-11-24 | Disposition: A | Discharge status: Home / Self Care | Payer: 59 | Source: Ambulatory Visit

## 2013-11-24 ENCOUNTER — Encounter (INDEPENDENT_AMBULATORY_CARE_PROVIDER_SITE_OTHER): Payer: Self-pay

## 2013-11-24 DIAGNOSIS — Z1231 Encounter for screening mammogram for malignant neoplasm of breast: Secondary | ICD-10-CM

## 2014-03-31 ENCOUNTER — Encounter: Payer: Self-pay | Admitting: Orthopedic Surgery

## 2014-03-31 ENCOUNTER — Ambulatory Visit (INDEPENDENT_AMBULATORY_CARE_PROVIDER_SITE_OTHER): Payer: 59 | Admitting: Orthopedic Surgery

## 2014-03-31 ENCOUNTER — Ambulatory Visit (INDEPENDENT_AMBULATORY_CARE_PROVIDER_SITE_OTHER): Payer: 59

## 2014-03-31 VITALS — BP 114/78 | Ht 64.0 in | Wt 137.0 lb

## 2014-03-31 DIAGNOSIS — M65311 Trigger thumb, right thumb: Secondary | ICD-10-CM

## 2014-03-31 DIAGNOSIS — M25531 Pain in right wrist: Secondary | ICD-10-CM

## 2014-03-31 NOTE — Progress Notes (Signed)
Patient ID: [name], female   DOB: [birth year], 48 y.o.   MRN: [id number]  Chief Complaint  Patient presents with  . Hand Pain    right thumb pain and locking, pain down into right wrist, no known injury    HPI [name] is a 48 y.o. female.  She presents with catching locking severe pain right thumb over the A1 pulley. She did get some decrease in pain with Aleve and splinting. 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  History of diabetes asthma and hypertension medical history HPI  Review of Systems Review of Systems Review of systems night sweats shortness of breath back pain joint pain lightheadedness dizziness tingling numbness frequent urination excess not urination vomiting nausea diarrhea constipation    Past Medical History  Diagnosis Date  . Hypertension   . Asthma     Past Surgical History  Procedure Laterality Date  . Abdominal hysterectomy      No family history on file.  Social History History  Substance Use Topics  . Smoking status: Never Smoker   . Smokeless tobacco: Not on file  . Alcohol Use: No    Allergies  Allergen Reactions  . Iohexol Nausea And Vomiting    Pt. Vomited immediately after injection of Omni 350.      Current Outpatient Prescriptions  Medication Sig Dispense Refill  . Cod Liver Oil 1000 MG CAPS Take 2 capsules by mouth daily.    . ferrous sulfate 325 (65 FE) MG tablet Take 325 mg by mouth every other day.    . hydrochlorothiazide (HYDRODIURIL) 50 MG tablet Dose unknown    . Ibuprofen-Diphenhydramine Cit (IBUPROFEN PM PO) Take by mouth.    . Multiple Vitamin (MULITIVITAMIN WITH MINERALS) TABS Take 1 tablet by mouth daily.    . naproxen sodium (ANAPROX) 220 MG tablet Take 220 mg by mouth 2 (two) times daily with a meal.    . etodolac (LODINE) 400 MG tablet Take 400 mg by mouth 2 (two) times daily  as needed. For back pain     No current facility-administered medications for this visit.       Physical Exam Blood pressure 114/78, height 5\' 4"  (1.626 m), weight 137 lb (62.143 kg). Physical Exam BP 114/78 mmHg  Ht 5\' 4"  (1.626 m)  Wt 137 lb (62.143 kg)  BMI 23.50 kg/m2 Normal development grooming and hygiene meticulous grooming is noted. She is oriented 3 mood and affect are normal body habitus ectomorphic  Is tenderness over the A1 pulley painful range of motion but normal passive range of motion somewhat restricted active range of motion. The joint at the MTP is stable. Flexion tendon is intact skin is normal pulse pulses are good sensation is normal capillary refill is excellent and lymph nodes are negative  Data Reviewed Independent x-ray and interpretation normal wrist and hand  Assessment    Trigger thumb    Plan    Right Trigger thumb injection Medication  1 mL of 40 mg Depo-Medrol  2 mL of 1% lidocaine plain  Ethyl chloride for anesthesia  Verbal consent was obtained timeout was taken to confirm the injection site as right thumb  Alcohol was used to prepare the skin along with ethyl chloride and then the injection was made at the A1 pulley there were no complications

## 2014-05-11 ENCOUNTER — Emergency Department (HOSPITAL_COMMUNITY): Payer: 59

## 2014-05-11 ENCOUNTER — Emergency Department (HOSPITAL_COMMUNITY)
Admission: EM | Admit: 2014-05-11 | Discharge: 2014-05-11 | Disposition: A | Discharge status: Home / Self Care | Payer: 59 | Attending: Emergency Medicine | Admitting: Emergency Medicine

## 2014-05-11 ENCOUNTER — Encounter (HOSPITAL_COMMUNITY): Payer: Self-pay | Admitting: *Deleted

## 2014-05-11 DIAGNOSIS — Z791 Long term (current) use of non-steroidal anti-inflammatories (NSAID): Secondary | ICD-10-CM | POA: Insufficient documentation

## 2014-05-11 DIAGNOSIS — M549 Dorsalgia, unspecified: Secondary | ICD-10-CM | POA: Insufficient documentation

## 2014-05-11 DIAGNOSIS — Z90710 Acquired absence of both cervix and uterus: Secondary | ICD-10-CM | POA: Diagnosis not present

## 2014-05-11 DIAGNOSIS — R109 Unspecified abdominal pain: Secondary | ICD-10-CM | POA: Diagnosis not present

## 2014-05-11 DIAGNOSIS — R42 Dizziness and giddiness: Secondary | ICD-10-CM | POA: Diagnosis not present

## 2014-05-11 DIAGNOSIS — R509 Fever, unspecified: Secondary | ICD-10-CM | POA: Insufficient documentation

## 2014-05-11 DIAGNOSIS — R1110 Vomiting, unspecified: Secondary | ICD-10-CM | POA: Diagnosis present

## 2014-05-11 DIAGNOSIS — R197 Diarrhea, unspecified: Secondary | ICD-10-CM | POA: Diagnosis not present

## 2014-05-11 DIAGNOSIS — J45909 Unspecified asthma, uncomplicated: Secondary | ICD-10-CM | POA: Insufficient documentation

## 2014-05-11 DIAGNOSIS — I10 Essential (primary) hypertension: Secondary | ICD-10-CM | POA: Diagnosis not present

## 2014-05-11 DIAGNOSIS — Z79899 Other long term (current) drug therapy: Secondary | ICD-10-CM | POA: Diagnosis not present

## 2014-05-11 LAB — URINALYSIS, ROUTINE W REFLEX MICROSCOPIC
Bilirubin Urine: NEGATIVE
Glucose, UA: NEGATIVE mg/dL
KETONES UR: NEGATIVE mg/dL
LEUKOCYTES UA: NEGATIVE
Nitrite: NEGATIVE
Protein, ur: NEGATIVE mg/dL
SPECIFIC GRAVITY, URINE: 1.015 (ref 1.005–1.030)
UROBILINOGEN UA: 0.2 mg/dL (ref 0.0–1.0)
pH: 6 (ref 5.0–8.0)

## 2014-05-11 LAB — CBC WITH DIFFERENTIAL/PLATELET
Basophils Absolute: 0 10*3/uL (ref 0.0–0.1)
Basophils Relative: 0 % (ref 0–1)
Eosinophils Absolute: 0 10*3/uL (ref 0.0–0.7)
Eosinophils Relative: 0 % (ref 0–5)
HCT: 33.6 % — ABNORMAL LOW (ref 36.0–46.0)
Hemoglobin: 11.3 g/dL — ABNORMAL LOW (ref 12.0–15.0)
LYMPHS ABS: 1.8 10*3/uL (ref 0.7–4.0)
Lymphocytes Relative: 31 % (ref 12–46)
MCH: 29.2 pg (ref 26.0–34.0)
MCHC: 33.6 g/dL (ref 30.0–36.0)
MCV: 86.8 fL (ref 78.0–100.0)
MONOS PCT: 14 % — AB (ref 3–12)
Monocytes Absolute: 0.8 10*3/uL (ref 0.1–1.0)
NEUTROS PCT: 55 % (ref 43–77)
Neutro Abs: 3.2 10*3/uL (ref 1.7–7.7)
PLATELETS: 162 10*3/uL (ref 150–400)
RBC: 3.87 MIL/uL (ref 3.87–5.11)
RDW: 11.4 % — ABNORMAL LOW (ref 11.5–15.5)
WBC: 5.9 10*3/uL (ref 4.0–10.5)

## 2014-05-11 LAB — COMPREHENSIVE METABOLIC PANEL
ALK PHOS: 47 U/L (ref 39–117)
ALT: 11 U/L (ref 0–35)
AST: 16 U/L (ref 0–37)
Albumin: 3.6 g/dL (ref 3.5–5.2)
Anion gap: 5 (ref 5–15)
BUN: 12 mg/dL (ref 6–23)
CO2: 25 mmol/L (ref 19–32)
Calcium: 9.4 mg/dL (ref 8.4–10.5)
Chloride: 113 mmol/L — ABNORMAL HIGH (ref 96–112)
Creatinine, Ser: 0.51 mg/dL (ref 0.50–1.10)
GFR calc non Af Amer: >90 mL/min (ref >90)
Glucose, Bld: 98 mg/dL (ref 70–99)
POTASSIUM: 3.5 mmol/L (ref 3.5–5.1)
SODIUM: 143 mmol/L (ref 135–145)
Total Bilirubin: 0.6 mg/dL (ref 0.3–1.2)
Total Protein: 6.8 g/dL (ref 6.0–8.3)

## 2014-05-11 LAB — LIPASE, BLOOD: Lipase: 42 U/L (ref 11–59)

## 2014-05-11 LAB — URINE MICROSCOPIC-ADD ON

## 2014-05-11 MED ORDER — SODIUM CHLORIDE 0.9 % IV BOLUS (SEPSIS)
1000.0000 mL | Freq: Once | INTRAVENOUS | Status: AC
  Administered 2014-05-11: 1000 mL via INTRAVENOUS

## 2014-05-11 MED ORDER — ONDANSETRON 4 MG PO TBDP: 4.0000 mg | ORAL_TABLET | Freq: Three times a day (TID) | ORAL | Status: DC | PRN

## 2014-05-11 MED ORDER — HYDROMORPHONE HCL 1 MG/ML IJ SOLN
0.5000 mg | Freq: Once | INTRAMUSCULAR | Status: AC
  Administered 2014-05-11: 0.5 mg via INTRAVENOUS
  Filled 2014-05-11: qty 1

## 2014-05-11 MED ORDER — SODIUM CHLORIDE 0.9 % IV SOLN: INTRAVENOUS | Status: DC

## 2014-05-11 MED ORDER — ONDANSETRON HCL 4 MG/2ML IJ SOLN
4.0000 mg | Freq: Once | INTRAMUSCULAR | Status: AC
  Administered 2014-05-11: 4 mg via INTRAVENOUS
  Filled 2014-05-11: qty 2

## 2014-05-11 NOTE — ED Notes (Signed)
Vomiting , no diarrhea, Seen by MD 1 week ago and given rx for  Vomiting.  abd cramping

## 2014-05-11 NOTE — Discharge Instructions (Signed)
Workup for the abdominal pain the vomiting without significant findings. Take the Zofran and/or the Phenergan for the nausea and vomiting. Follow-up with your doctor if not improved in a few days. Work note provided to be off for 2 days.

## 2014-05-11 NOTE — ED Provider Notes (Signed)
CSN: [id number]     Arrival date & time 05/11/14  1317 History  This chart was scribed for Fredia Sorrow, MD by Stephania Fragmin, ED Scribe. This patient was seen in room APA14/APA14 and the patient's care was started at 1:51 PM.    Chief Complaint  Patient presents with  . Emesis   Patient is a 49 y.o. female presenting with vomiting. The history is provided by the patient. No language interpreter was used.  Emesis Severity:  Moderate Duration:  1 week Timing:  Intermittent Progression:  Unchanged Chronicity:  New Recent urination:  Normal Context: not post-tussive and not self-induced   Relieved by:  Nothing Worsened by:  Nothing tried Ineffective treatments:  None tried Associated symptoms: abdominal pain, chills, diarrhea and fever   Associated symptoms: no arthralgias, no cough, no headaches, no myalgias, no sore throat and no URI     HPI Comments: [name] is a 49 y.o. female who presents to the Emergency Department complaining of intermittent vomiting that began 1 week ago. Per husband, patient had felt very well last night before her symptoms returned. She complains of associated 5/10 stomach cramps, nausea, 2-3 episodes of diarrhea, lightheadedness, back pain, fever, and chills. She had gone to her PCP 1 week ago, on Monday, and give Phenergan and Hyoscyamine Sulfa 0.125 mg every 4 hours--she had taken them without relief. She denies having had a CAT scan. Patient has had similar symptoms with a stomach virus before, but it hasn't lasted this long. No one at home is sick. Patient has had a surgical history of laparoscopy and hysterectomy. She denies hematochezia, visual changes, cough, rhinorrhea, sore throat, chest pain, SOB, dysuria, hematuria, leg swelling, rash, bleeding easily, generalized myalgia, or headache.  Dr. Alroy Dust at [facility] in [place] is her PCP  Past Medical History  Diagnosis Date  . Hypertension   . Asthma    Past Surgical History  Procedure  Laterality Date  . Abdominal hysterectomy    . Ovarian cyst surgery     History reviewed. No pertinent family history. History  Substance Use Topics  . Smoking status: Never Smoker   . Smokeless tobacco: Not on file  . Alcohol Use: No   OB History    No data available     Review of Systems  Constitutional: Positive for fever and chills.  HENT: Negative for rhinorrhea and sore throat.   Eyes: Negative for photophobia and visual disturbance.  Respiratory: Negative for cough and shortness of breath.   Cardiovascular: Negative for chest pain and leg swelling.  Gastrointestinal: Positive for vomiting, abdominal pain and diarrhea. Negative for nausea and blood in stool.  Genitourinary: Negative for dysuria and hematuria.  Musculoskeletal: Positive for back pain. Negative for myalgias and arthralgias.  Skin: Negative for rash.  Neurological: Positive for light-headedness. Negative for headaches.  Hematological: Does not bruise/bleed easily.      Allergies  Contrast media; Iohexol; and Vicodin  Home Medications   Prior to Admission medications   Medication Sig Start Date End Date Taking? Authorizing Provider  Cod Liver Oil 1000 MG CAPS Take 2 capsules by mouth daily.   Yes Historical Provider, MD  ferrous sulfate 325 (65 FE) MG tablet Take 325 mg by mouth every other day.   Yes Historical Provider, MD  hydrochlorothiazide (HYDRODIURIL) 25 MG tablet Take 25 mg by mouth daily. 04/28/14  Yes Historical Provider, MD  hyoscyamine (LEVSIN, ANASPAZ) 0.125 MG tablet Take 1 tablet by mouth every 4 (four) hours as needed  for cramping.  05/04/14  Yes Historical Provider, MD  Ibuprofen-Diphenhydramine Cit (IBUPROFEN PM PO) Take 1 tablet by mouth at bedtime as needed (pain/sleep).    Yes Historical Provider, MD  Multiple Vitamin (MULITIVITAMIN WITH MINERALS) TABS Take 1 tablet by mouth daily.   Yes Historical Provider, MD  naproxen sodium (ANAPROX) 220 MG tablet Take 220 mg by mouth 2 (two)  times daily as needed (pain).    Yes Historical Provider, MD  promethazine (PHENERGAN) 25 MG tablet Take 1 tablet by mouth every 4 (four) hours as needed for nausea or vomiting.  05/04/14  Yes Historical Provider, MD  ondansetron (ZOFRAN ODT) 4 MG disintegrating tablet Take 1 tablet (4 mg total) by mouth every 8 (eight) hours as needed. 05/11/14   Fredia Sorrow, MD   BP 121/72 mmHg  Pulse 95  Temp(Src) 98.5 F (36.9 C) (Oral)  Resp 20  Ht 5\' 4"  (1.626 m)  Wt 131 lb (59.421 kg)  BMI 22.47 kg/m2  SpO2 100% Physical Exam  Constitutional: She is oriented to person, place, and time. She appears well-developed and well-nourished. No distress.  HENT:  Head: Normocephalic and atraumatic.  Mouth/Throat: Oropharynx is clear and moist.  Mucus membranes somewhat moist.  Eyes: Conjunctivae and EOM are normal. Pupils are equal, round, and reactive to light.  Sclera is clear.  Neck: Neck supple. No tracheal deviation present.  Cardiovascular: Normal rate, regular rhythm and normal heart sounds.   No murmur heard. Pulmonary/Chest: Effort normal and breath sounds normal. No respiratory distress. She has no wheezes. She has no rales.  Lungs are clear.  Abdominal: Soft. Bowel sounds are normal. She exhibits no distension. There is no tenderness.  Musculoskeletal: Normal range of motion. She exhibits no edema (No ankle swelling.).  Neurological: She is alert and oriented to person, place, and time.  Skin: Skin is warm and dry.  Psychiatric: She has a normal mood and affect. Her behavior is normal.  Nursing note and vitals reviewed.   ED Course  Procedures (including critical care time)  DIAGNOSTIC STUDIES: Oxygen Saturation is 100% on room air, normal by my interpretation.    COORDINATION OF CARE: 1:55 PM - Discussed treatment plan with pt at bedside which includes IV fluids, anti-nausea medication, lab tests, and imaging, and pt agreed to plan.   Labs Review Labs Reviewed  URINALYSIS,  ROUTINE W REFLEX MICROSCOPIC - Abnormal; Notable for the following:    Hgb urine dipstick SMALL (*)    All other components within normal limits  CBC WITH DIFFERENTIAL/PLATELET - Abnormal; Notable for the following:    Hemoglobin 11.3 (*)    HCT 33.6 (*)    RDW 11.4 (*)    Monocytes Relative 14 (*)    All other components within normal limits  COMPREHENSIVE METABOLIC PANEL - Abnormal; Notable for the following:    Chloride 113 (*)    All other components within normal limits  LIPASE, BLOOD  URINE MICROSCOPIC-ADD ON   Results for orders placed or performed during the hospital encounter of 05/11/14  Urinalysis, Routine w reflex microscopic  Result Value Ref Range   Color, Urine YELLOW YELLOW   APPearance CLEAR CLEAR   Specific Gravity, Urine 1.015 1.005 - 1.030   pH 6.0 5.0 - 8.0   Glucose, UA NEGATIVE NEGATIVE mg/dL   Hgb urine dipstick SMALL (A) NEGATIVE   Bilirubin Urine NEGATIVE NEGATIVE   Ketones, ur NEGATIVE NEGATIVE mg/dL   Protein, ur NEGATIVE NEGATIVE mg/dL   Urobilinogen, UA 0.2 0.0 -  1.0 mg/dL   Nitrite NEGATIVE NEGATIVE   Leukocytes, UA NEGATIVE NEGATIVE  CBC with Differential/Platelet  Result Value Ref Range   WBC 5.9 4.0 - 10.5 K/uL   RBC 3.87 3.87 - 5.11 MIL/uL   Hemoglobin 11.3 (L) 12.0 - 15.0 g/dL   HCT 33.6 (L) 36.0 - 46.0 %   MCV 86.8 78.0 - 100.0 fL   MCH 29.2 26.0 - 34.0 pg   MCHC 33.6 30.0 - 36.0 g/dL   RDW 11.4 (L) 11.5 - 15.5 %   Platelets 162 150 - 400 K/uL   Neutrophils Relative % 55 43 - 77 %   Neutro Abs 3.2 1.7 - 7.7 K/uL   Lymphocytes Relative 31 12 - 46 %   Lymphs Abs 1.8 0.7 - 4.0 K/uL   Monocytes Relative 14 (H) 3 - 12 %   Monocytes Absolute 0.8 0.1 - 1.0 K/uL   Eosinophils Relative 0 0 - 5 %   Eosinophils Absolute 0.0 0.0 - 0.7 K/uL   Basophils Relative 0 0 - 1 %   Basophils Absolute 0.0 0.0 - 0.1 K/uL  Comprehensive metabolic panel  Result Value Ref Range   Sodium 143 135 - 145 mmol/L   Potassium 3.5 3.5 - 5.1 mmol/L   Chloride  113 (H) 96 - 112 mmol/L   CO2 25 19 - 32 mmol/L   Glucose, Bld 98 70 - 99 mg/dL   BUN 12 6 - 23 mg/dL   Creatinine, Ser 0.51 0.50 - 1.10 mg/dL   Calcium 9.4 8.4 - 10.5 mg/dL   Total Protein 6.8 6.0 - 8.3 g/dL   Albumin 3.6 3.5 - 5.2 g/dL   AST 16 0 - 37 U/L   ALT 11 0 - 35 U/L   Alkaline Phosphatase 47 39 - 117 U/L   Total Bilirubin 0.6 0.3 - 1.2 mg/dL   GFR calc non Af Amer >90 >90 mL/min   GFR calc Af Amer >90 >90 mL/min   Anion gap 5 5 - 15  Lipase, blood  Result Value Ref Range   Lipase 42 11 - 59 U/L  Urine microscopic-add on  Result Value Ref Range   Squamous Epithelial / LPF RARE RARE   WBC, UA 0-2 <3 WBC/hpf   RBC / HPF 0-2 <3 RBC/hpf     Imaging Review Ct Abdomen Pelvis Wo Contrast  05/11/2014   CLINICAL DATA:  Persistent abdominal pain for approximately 13 days with vomiting.  EXAM: CT ABDOMEN AND PELVIS WITHOUT CONTRAST  TECHNIQUE: Multidetector CT imaging of the abdomen and pelvis was performed following the standard protocol without IV contrast. (Patient has a IV contrast allergy)  COMPARISON:  07/23/2010  FINDINGS: Lower chest: Clear lung bases. Normal heart size. No pericardial or pleural effusion.  Abdomen: The kidneys demonstrate no acute hydronephrosis, perinephric inflammation, or obstructing ureteral calculus on either side.  The liver, gallbladder, biliary system, pancreas, spleen, and adrenal glands are within normal limits for age and noncontrast imaging.  Slight wall prominence of the distal stomach and the duodenum, difficult to exclude mild gastritis/ duodenitis.  No associated bowel obstruction, dilatation, ileus, or free air.  Pelvis: Prior hysterectomy. No pelvic free fluid, fluid collection, hemorrhage, abscess, adenopathy, inguinal abnormality, or hernia. Urinary bladder unremarkable. Pelvic calcifications consistent with venous phleboliths. No acute distal bowel process. Portions of the appendix are demonstrated across the right iliac vessels, images 59 and  60 without evidence of appendicitis. Right lower quadrant lobulated soft tissue overlies the right iliopsoas muscle, image 60. This  measures 5.0 x 2.1 cm, image 60 and correlates with an enlarged right ovary when compared to prior contrast exam.  No acute osseous finding.  IMPRESSION: Distal stomach and diffuse duodenal slightly wall prominence, nonspecific but could represent mild gastritis/ duodenitis. No associated bowel obstruction, abscess or free air.  No acute obstructing urinary tract calculus or hydronephrosis.  Normal appendix.  Slightly smaller but still enlarged right ovary measuring 5 x 2.1 cm. This remains nonspecific. Consider follow-up nonemergent pelvic ultrasound.   Electronically Signed   By: Daryll Brod M.D.   On: 05/11/2014 16:55   Dg Chest 2 View  05/11/2014   CLINICAL DATA:  Nausea, vomiting, diarrhea and headache.  EXAM: CHEST - 2 VIEW  COMPARISON:  CTA of the chest on 04/30/2011  FINDINGS: The heart size and mediastinal contours are within normal limits. There is no evidence of pulmonary edema, consolidation, pneumothorax, nodule or pleural fluid. The visualized skeletal structures are unremarkable.  IMPRESSION: No active disease.   Electronically Signed   By: Aletta Edouard M.D.   On: 05/11/2014 16:30     EKG Interpretation None      MDM   Final diagnoses:  Vomiting  Abdominal pain   Symptoms seem to be consistent with a viral gastroenteritis and now may be faced back into just a gastritis. Workup negative. No significant leukocytosis no significant electrolyte abnormalities. No liver function test abnormalities lipase not consistent with pancreatitis. CT scan shows a little bit of inflammatory changes in the stomach which could be consistent with a gastritis. No evidence of colitis or significant enteritis. Patient already on Phenergan we'll add Zofran to the treatment regimen. Patient has follow-up with regular doctor. Work note provided.  I personally performed the  services described in this documentation, which was scribed in my presence. The recorded information has been reviewed and is accurate.      Fredia Sorrow, MD 05/11/14 [phone]

## 2014-05-18 ENCOUNTER — Emergency Department (HOSPITAL_COMMUNITY)
Admission: EM | Admit: 2014-05-18 | Discharge: 2014-05-18 | Disposition: A | Discharge status: Home / Self Care | Payer: 59 | Attending: Emergency Medicine | Admitting: Emergency Medicine

## 2014-05-18 ENCOUNTER — Encounter (HOSPITAL_COMMUNITY): Payer: Self-pay

## 2014-05-18 DIAGNOSIS — J45909 Unspecified asthma, uncomplicated: Secondary | ICD-10-CM | POA: Insufficient documentation

## 2014-05-18 DIAGNOSIS — R55 Syncope and collapse: Secondary | ICD-10-CM | POA: Diagnosis present

## 2014-05-18 DIAGNOSIS — I10 Essential (primary) hypertension: Secondary | ICD-10-CM | POA: Insufficient documentation

## 2014-05-18 DIAGNOSIS — R5381 Other malaise: Secondary | ICD-10-CM | POA: Diagnosis not present

## 2014-05-18 DIAGNOSIS — R531 Weakness: Secondary | ICD-10-CM | POA: Diagnosis not present

## 2014-05-18 DIAGNOSIS — R000 Tachycardia, unspecified: Secondary | ICD-10-CM | POA: Insufficient documentation

## 2014-05-18 DIAGNOSIS — R112 Nausea with vomiting, unspecified: Secondary | ICD-10-CM | POA: Diagnosis not present

## 2014-05-18 DIAGNOSIS — H539 Unspecified visual disturbance: Secondary | ICD-10-CM | POA: Insufficient documentation

## 2014-05-18 DIAGNOSIS — R42 Dizziness and giddiness: Secondary | ICD-10-CM | POA: Insufficient documentation

## 2014-05-18 DIAGNOSIS — Z79899 Other long term (current) drug therapy: Secondary | ICD-10-CM | POA: Diagnosis not present

## 2014-05-18 DIAGNOSIS — R51 Headache: Secondary | ICD-10-CM | POA: Insufficient documentation

## 2014-05-18 DIAGNOSIS — R5383 Other fatigue: Secondary | ICD-10-CM | POA: Diagnosis not present

## 2014-05-18 DIAGNOSIS — R1084 Generalized abdominal pain: Secondary | ICD-10-CM | POA: Insufficient documentation

## 2014-05-18 DIAGNOSIS — R509 Fever, unspecified: Secondary | ICD-10-CM | POA: Insufficient documentation

## 2014-05-18 LAB — BASIC METABOLIC PANEL
ANION GAP: 6 (ref 5–15)
BUN: 14 mg/dL (ref 6–23)
CHLORIDE: 106 mmol/L (ref 96–112)
CO2: 25 mmol/L (ref 19–32)
CREATININE: 0.69 mg/dL (ref 0.50–1.10)
Calcium: 10 mg/dL (ref 8.4–10.5)
GFR calc Af Amer: >90 mL/min (ref >90)
GFR calc non Af Amer: >90 mL/min (ref >90)
GLUCOSE: 127 mg/dL — AB (ref 70–99)
POTASSIUM: 3.3 mmol/L — AB (ref 3.5–5.1)
Sodium: 137 mmol/L (ref 135–145)

## 2014-05-18 LAB — CBC WITH DIFFERENTIAL/PLATELET
Basophils Absolute: 0 10*3/uL (ref 0.0–0.1)
Basophils Relative: 0 % (ref 0–1)
Eosinophils Absolute: 0 10*3/uL (ref 0.0–0.7)
Eosinophils Relative: 0 % (ref 0–5)
HCT: 36.7 % (ref 36.0–46.0)
HEMOGLOBIN: 12.2 g/dL (ref 12.0–15.0)
LYMPHS ABS: 1 10*3/uL (ref 0.7–4.0)
LYMPHS PCT: 13 % (ref 12–46)
MCH: 28.6 pg (ref 26.0–34.0)
MCHC: 33.2 g/dL (ref 30.0–36.0)
MCV: 85.9 fL (ref 78.0–100.0)
Monocytes Absolute: 0.7 10*3/uL (ref 0.1–1.0)
Monocytes Relative: 9 % (ref 3–12)
Neutro Abs: 5.7 10*3/uL (ref 1.7–7.7)
Neutrophils Relative %: 78 % — ABNORMAL HIGH (ref 43–77)
PLATELETS: 205 10*3/uL (ref 150–400)
RBC: 4.27 MIL/uL (ref 3.87–5.11)
RDW: 11.2 % — AB (ref 11.5–15.5)
WBC: 7.4 10*3/uL (ref 4.0–10.5)

## 2014-05-18 LAB — HEPATIC FUNCTION PANEL
ALBUMIN: 3.8 g/dL (ref 3.5–5.2)
ALK PHOS: 48 U/L (ref 39–117)
ALT: 15 U/L (ref 0–35)
AST: 22 U/L (ref 0–37)
BILIRUBIN DIRECT: 0.1 mg/dL (ref 0.0–0.5)
BILIRUBIN TOTAL: 0.7 mg/dL (ref 0.3–1.2)
Indirect Bilirubin: 0.6 mg/dL (ref 0.3–0.9)
TOTAL PROTEIN: 7.4 g/dL (ref 6.0–8.3)

## 2014-05-18 LAB — LIPASE, BLOOD: LIPASE: 38 U/L (ref 11–59)

## 2014-05-18 MED ORDER — PROMETHAZINE HCL 25 MG PO TABS: 25.0000 mg | ORAL_TABLET | Freq: Four times a day (QID) | ORAL | Status: DC | PRN

## 2014-05-18 MED ORDER — SODIUM CHLORIDE 0.9 % IV SOLN: INTRAVENOUS | Status: DC

## 2014-05-18 MED ORDER — ONDANSETRON HCL 4 MG/2ML IJ SOLN
4.0000 mg | Freq: Once | INTRAMUSCULAR | Status: AC
  Administered 2014-05-18: 4 mg via INTRAVENOUS
  Filled 2014-05-18: qty 2

## 2014-05-18 MED ORDER — SODIUM CHLORIDE 0.9 % IV BOLUS (SEPSIS)
1000.0000 mL | Freq: Once | INTRAVENOUS | Status: AC
  Administered 2014-05-18: 1000 mL via INTRAVENOUS

## 2014-05-18 NOTE — ED Notes (Signed)
Pt states she has had a virus for about three weeks. States she had some nausea and vomiting this morning and became dizzy. States her co workers placed her in a chair and she passed out

## 2014-05-18 NOTE — ED Provider Notes (Addendum)
CSN: [id number]     Arrival date & time 05/18/14  1050 History  This chart was scribed for Fredia Sorrow, MD by Stephania Fragmin, ED Scribe. This patient was seen in room APA05/APA05 and the patient's care was started at 1:08 PM.    Chief Complaint  Patient presents with  . Near Syncope   Patient is a 49 y.o. female presenting with syncope. The history is provided by the patient. No language interpreter was used.  Loss of Consciousness Episode history:  Single Most recent episode:  Today Chronicity:  New Context: sitting down   Relieved by:  None tried Worsened by:  Nothing tried Ineffective treatments:  None tried Associated symptoms: fever, headaches, malaise/fatigue, nausea, visual change, vomiting and weakness   Associated symptoms: no chest pain and no shortness of breath      HPI Comments: [name] is a 49 y.o. female brought in by ambulance, who presents to the Emergency Department for an episode of syncope that occurred at work, when she sat down in a chair, felt lightheaded, and lost consciousness. Patient had abdominal pain, nausea and vomiting every day since 1 week ago when she was seen by me in the ED for symptoms that began 2 weeks ago, on 05/04/2014. She had been taking Zofran prescribed by me with no relief. She hasn't seen Dr. Alroy Dust since the onset of her symptoms. Patient also complains of associated fever and chills, blurred vision that is typical with near syncope, weakness, lightheadedness, and intermittent headache, although she doesn't currently have one. She has been eating and drinking, although she hasn't had much solid food. Patient works in Scientist, research (medical) at [employer]. She denies any recent travel out of the country.  She denies diarrhea, cough, rhinorrhea, sore throat, chest pain, SOB, hematemesis, dysuria, leg swelling, rash, bleeding easily, or generalized myalgia.  PCP Dr. Alroy Dust  1:15 PM-Room air O2 sats at 100%, heart rate slightly tachycardic at 103.      Past  Medical History  Diagnosis Date  . Hypertension   . Asthma    Past Surgical History  Procedure Laterality Date  . Abdominal hysterectomy    . Ovarian cyst surgery     No family history on file. History  Substance Use Topics  . Smoking status: Never Smoker   . Smokeless tobacco: Not on file  . Alcohol Use: No   OB History    No data available     Review of Systems  Constitutional: Positive for fever, chills and malaise/fatigue.  HENT: Negative for rhinorrhea and sore throat.   Eyes: Positive for visual disturbance.  Respiratory: Negative for cough and shortness of breath.   Cardiovascular: Positive for syncope. Negative for chest pain and leg swelling.  Gastrointestinal: Positive for nausea and vomiting. Negative for diarrhea.  Musculoskeletal: Negative for myalgias and back pain.  Skin: Negative for rash.  Neurological: Positive for syncope, weakness, light-headedness and headaches.  Hematological: Does not bruise/bleed easily.      Allergies  Contrast media; Iohexol; and Vicodin  Home Medications   Prior to Admission medications   Medication Sig Start Date End Date Taking? Authorizing Provider  Cod Liver Oil 1000 MG CAPS Take 2 capsules by mouth daily.   Yes Historical Provider, MD  ferrous sulfate 325 (65 FE) MG tablet Take 325 mg by mouth every other day.   Yes Historical Provider, MD  hydrochlorothiazide (HYDRODIURIL) 25 MG tablet Take 25 mg by mouth daily. 04/28/14  Yes Historical Provider, MD  hyoscyamine (LEVSIN, ANASPAZ) 0.125  MG tablet Take 1 tablet by mouth every 4 (four) hours as needed for cramping.  05/04/14  Yes Historical Provider, MD  Ibuprofen-Diphenhydramine Cit (IBUPROFEN PM PO) Take 1 tablet by mouth at bedtime as needed (pain/sleep).    Yes Historical Provider, MD  Multiple Vitamin (MULITIVITAMIN WITH MINERALS) TABS Take 1 tablet by mouth daily.   Yes Historical Provider, MD  naproxen sodium (ANAPROX) 220 MG tablet Take 220 mg by mouth 2 (two) times  daily as needed (pain).    Yes Historical Provider, MD  ondansetron (ZOFRAN ODT) 4 MG disintegrating tablet Take 1 tablet (4 mg total) by mouth every 8 (eight) hours as needed. 05/11/14  Yes Fredia Sorrow, MD  promethazine (PHENERGAN) 25 MG tablet Take 1 tablet by mouth every 4 (four) hours as needed for nausea or vomiting.  05/04/14  Yes Historical Provider, MD  promethazine (PHENERGAN) 25 MG tablet Take 1 tablet (25 mg total) by mouth every 6 (six) hours as needed. 05/18/14   Fredia Sorrow, MD   BP 96/56 mmHg  Pulse 101  Temp(Src) 97.6 F (36.4 C) (Oral)  Resp 16  Ht 5\' 4"  (1.626 m)  Wt 130 lb (58.968 kg)  BMI 22.30 kg/m2  SpO2 99% Physical Exam  Constitutional: She is oriented to person, place, and time. She appears well-developed and well-nourished. No distress.  HENT:  Head: Normocephalic and atraumatic.  Mouth/Throat: Oropharynx is clear and moist.  Eyes: Conjunctivae and EOM are normal. Pupils are equal, round, and reactive to light.  Neck: Neck supple. No tracheal deviation present.  Cardiovascular: Regular rhythm and normal heart sounds.   Slightly tachycardic.  Pulmonary/Chest: Effort normal and breath sounds normal. No respiratory distress. She has no wheezes. She has no rales.  Abdominal: Soft. Bowel sounds are normal. She exhibits no distension. There is tenderness (Mild generalized tenderness to palpation.). There is no rebound.  Musculoskeletal: Normal range of motion. She exhibits no edema (No ankle swelling.).  Neurological: She is alert and oriented to person, place, and time.  Skin: Skin is warm and dry.  Psychiatric: She has a normal mood and affect. Her behavior is normal.  Nursing note and vitals reviewed.   ED Course  Procedures (including critical care time)  DIAGNOSTIC STUDIES: Oxygen Saturation is 100% on room air, normal by my interpretation.    COORDINATION OF CARE: 1:15 PM - Discussed treatment plan with pt at bedside which includes IV luids and  anti-nausea medication, and review of past tests, and pt agreed to plan.  Labs Review Labs Reviewed  CBC WITH DIFFERENTIAL/PLATELET - Abnormal; Notable for the following:    RDW 11.2 (*)    Neutrophils Relative % 78 (*)    All other components within normal limits  BASIC METABOLIC PANEL - Abnormal; Notable for the following:    Potassium 3.3 (*)    Glucose, Bld 127 (*)    All other components within normal limits  LIPASE, BLOOD  HEPATIC FUNCTION PANEL   Results for orders placed or performed during the hospital encounter of 05/18/14  CBC with Differential  Result Value Ref Range   WBC 7.4 4.0 - 10.5 K/uL   RBC 4.27 3.87 - 5.11 MIL/uL   Hemoglobin 12.2 12.0 - 15.0 g/dL   HCT 36.7 36.0 - 46.0 %   MCV 85.9 78.0 - 100.0 fL   MCH 28.6 26.0 - 34.0 pg   MCHC 33.2 30.0 - 36.0 g/dL   RDW 11.2 (L) 11.5 - 15.5 %   Platelets 205 150 -  400 K/uL   Neutrophils Relative % 78 (H) 43 - 77 %   Neutro Abs 5.7 1.7 - 7.7 K/uL   Lymphocytes Relative 13 12 - 46 %   Lymphs Abs 1.0 0.7 - 4.0 K/uL   Monocytes Relative 9 3 - 12 %   Monocytes Absolute 0.7 0.1 - 1.0 K/uL   Eosinophils Relative 0 0 - 5 %   Eosinophils Absolute 0.0 0.0 - 0.7 K/uL   Basophils Relative 0 0 - 1 %   Basophils Absolute 0.0 0.0 - 0.1 K/uL  Basic metabolic panel  Result Value Ref Range   Sodium 137 135 - 145 mmol/L   Potassium 3.3 (L) 3.5 - 5.1 mmol/L   Chloride 106 96 - 112 mmol/L   CO2 25 19 - 32 mmol/L   Glucose, Bld 127 (H) 70 - 99 mg/dL   BUN 14 6 - 23 mg/dL   Creatinine, Ser 0.69 0.50 - 1.10 mg/dL   Calcium 10.0 8.4 - 10.5 mg/dL   GFR calc non Af Amer >90 >90 mL/min   GFR calc Af Amer >90 >90 mL/min   Anion gap 6 5 - 15  Lipase, blood  Result Value Ref Range   Lipase 38 11 - 59 U/L  Hepatic function panel  Result Value Ref Range   Total Protein 7.4 6.0 - 8.3 g/dL   Albumin 3.8 3.5 - 5.2 g/dL   AST 22 0 - 37 U/L   ALT 15 0 - 35 U/L   Alkaline Phosphatase 48 39 - 117 U/L   Total Bilirubin 0.7 0.3 - 1.2 mg/dL    Bilirubin, Direct 0.1 0.0 - 0.5 mg/dL   Indirect Bilirubin 0.6 0.3 - 0.9 mg/dL     Imaging Review No results found.   EKG Interpretation None      Date: 05/18/2014  Rate: 110  Rhythm: sinus tachycardia  QRS Axis: normal  Intervals: normal  ST/T Wave abnormalities: nonspecific T wave changes  Conduction Disutrbances:none  Narrative Interpretation:   Old EKG Reviewed: none available        MDM   Final diagnoses:  Syncope, vasovagal  Non-intractable vomiting with nausea, vomiting of unspecified type   Patient seen by me on February 1. Patient had been seen earlier on January 25 onset of nausea and vomiting type illness at that time it has persisted. Patient's been having symptoms now for 2 weeks. Patient was at work today not feeling well at thrown up a few times. Sat down in a chair and passed out. No injuries from the fall. Patient's workup negative. Symptoms seem to be consistent with a vasovagal syncope from the vomiting. Patient's EKG with some mild tachycardia. No significant shortness of breath. No hypoxia. No fever. Associated with some mild abdominal discomfort. On February 1 patient had chest x-ray and CT abdomen pelvis the with the only finding being some inflammation in the gastric area. Those were not repeated today. Patient does have a primary care doctor. Stressed very important that she follow-up this is starting to get to be a little prolonged to be a viral illness. Patient will be given a work note. Patient has Zofran for nausea and vomiting at home we'll supplement that with Phenergan.  EKG without any acute changes. Also no neuro deficits. No arrhythmias.   I personally performed the services described in this documentation, which was scribed in my presence. The recorded information has been reviewed and is accurate.      Fredia Sorrow, MD 05/18/14 1500  Fredia Sorrow, MD 05/18/14 1501

## 2014-05-18 NOTE — Discharge Instructions (Signed)
It will be important to follow-up with Dr. Alroy Dust. Make an appointment. Today's labs without significant abnormalities. This may be a viral illness but it's starting to get a little prolonged. Return for any new or worse symptoms. The passing out was probably related to the nausea and vomiting.

## 2014-05-18 NOTE — ED Notes (Signed)
Pt has been having nausea and vomiting for the past 3 weeks.  Was going to go home from work due to not feeling well and sat down in chair and passed out.  Was lowered to the floor by coworkers.

## 2014-05-23 ENCOUNTER — Encounter (HOSPITAL_COMMUNITY): Payer: Self-pay | Admitting: *Deleted

## 2014-05-23 ENCOUNTER — Inpatient Hospital Stay (HOSPITAL_COMMUNITY)
Admission: EM | Admit: 2014-05-23 | Discharge: 2014-05-28 | DRG: 381 | Disposition: A | Discharge status: Home / Self Care | Payer: 59 | Attending: Family Medicine | Admitting: Family Medicine

## 2014-05-23 ENCOUNTER — Emergency Department (HOSPITAL_COMMUNITY): Payer: 59

## 2014-05-23 DIAGNOSIS — R634 Abnormal weight loss: Secondary | ICD-10-CM

## 2014-05-23 DIAGNOSIS — E0590 Thyrotoxicosis, unspecified without thyrotoxic crisis or storm: Secondary | ICD-10-CM | POA: Diagnosis present

## 2014-05-23 DIAGNOSIS — K859 Acute pancreatitis without necrosis or infection, unspecified: Secondary | ICD-10-CM | POA: Insufficient documentation

## 2014-05-23 DIAGNOSIS — E876 Hypokalemia: Secondary | ICD-10-CM | POA: Diagnosis present

## 2014-05-23 DIAGNOSIS — Z91041 Radiographic dye allergy status: Secondary | ICD-10-CM

## 2014-05-23 DIAGNOSIS — R109 Unspecified abdominal pain: Secondary | ICD-10-CM

## 2014-05-23 DIAGNOSIS — N838 Other noninflammatory disorders of ovary, fallopian tube and broad ligament: Secondary | ICD-10-CM | POA: Diagnosis present

## 2014-05-23 DIAGNOSIS — K2970 Gastritis, unspecified, without bleeding: Secondary | ICD-10-CM | POA: Diagnosis present

## 2014-05-23 DIAGNOSIS — R509 Fever, unspecified: Secondary | ICD-10-CM

## 2014-05-23 DIAGNOSIS — Z8249 Family history of ischemic heart disease and other diseases of the circulatory system: Secondary | ICD-10-CM

## 2014-05-23 DIAGNOSIS — E860 Dehydration: Secondary | ICD-10-CM

## 2014-05-23 DIAGNOSIS — R112 Nausea with vomiting, unspecified: Secondary | ICD-10-CM | POA: Diagnosis not present

## 2014-05-23 DIAGNOSIS — K311 Adult hypertrophic pyloric stenosis: Secondary | ICD-10-CM | POA: Diagnosis not present

## 2014-05-23 DIAGNOSIS — Z90710 Acquired absence of both cervix and uterus: Secondary | ICD-10-CM

## 2014-05-23 DIAGNOSIS — R000 Tachycardia, unspecified: Secondary | ICD-10-CM | POA: Diagnosis present

## 2014-05-23 DIAGNOSIS — J45909 Unspecified asthma, uncomplicated: Secondary | ICD-10-CM | POA: Diagnosis present

## 2014-05-23 DIAGNOSIS — R1084 Generalized abdominal pain: Secondary | ICD-10-CM | POA: Insufficient documentation

## 2014-05-23 DIAGNOSIS — K2980 Duodenitis without bleeding: Secondary | ICD-10-CM | POA: Diagnosis present

## 2014-05-23 DIAGNOSIS — I10 Essential (primary) hypertension: Secondary | ICD-10-CM | POA: Diagnosis present

## 2014-05-23 DIAGNOSIS — K219 Gastro-esophageal reflux disease without esophagitis: Secondary | ICD-10-CM

## 2014-05-23 DIAGNOSIS — Z833 Family history of diabetes mellitus: Secondary | ICD-10-CM

## 2014-05-23 DIAGNOSIS — N390 Urinary tract infection, site not specified: Secondary | ICD-10-CM | POA: Diagnosis present

## 2014-05-23 HISTORY — DX: Other noninflammatory disorders of ovary, fallopian tube and broad ligament: N83.8

## 2014-05-23 HISTORY — DX: Thyrotoxicosis, unspecified without thyrotoxic crisis or storm: E05.90

## 2014-05-23 LAB — URINALYSIS, ROUTINE W REFLEX MICROSCOPIC
Glucose, UA: NEGATIVE mg/dL
Nitrite: NEGATIVE
PH: 6 (ref 5.0–8.0)
Protein, ur: NEGATIVE mg/dL
Specific Gravity, Urine: 1.025 (ref 1.005–1.030)
Urobilinogen, UA: 0.2 mg/dL (ref 0.0–1.0)

## 2014-05-23 LAB — COMPREHENSIVE METABOLIC PANEL
ALBUMIN: 4.1 g/dL (ref 3.5–5.2)
ALT: 16 U/L (ref 0–35)
AST: 20 U/L (ref 0–37)
Alkaline Phosphatase: 54 U/L (ref 39–117)
Anion gap: 7 (ref 5–15)
BUN: 23 mg/dL (ref 6–23)
CO2: 26 mmol/L (ref 19–32)
CREATININE: 0.86 mg/dL (ref 0.50–1.10)
Calcium: 10.3 mg/dL (ref 8.4–10.5)
Chloride: 104 mmol/L (ref 96–112)
GFR calc Af Amer: >90 mL/min (ref >90)
GFR calc non Af Amer: 79 mL/min — ABNORMAL LOW (ref >90)
Glucose, Bld: 127 mg/dL — ABNORMAL HIGH (ref 70–99)
POTASSIUM: 3.5 mmol/L (ref 3.5–5.1)
Sodium: 137 mmol/L (ref 135–145)
TOTAL PROTEIN: 7.7 g/dL (ref 6.0–8.3)
Total Bilirubin: 0.7 mg/dL (ref 0.3–1.2)

## 2014-05-23 LAB — URINE MICROSCOPIC-ADD ON

## 2014-05-23 LAB — CBC WITH DIFFERENTIAL/PLATELET
BASOS PCT: 0 % (ref 0–1)
Basophils Absolute: 0 10*3/uL (ref 0.0–0.1)
Eosinophils Absolute: 0 10*3/uL (ref 0.0–0.7)
Eosinophils Relative: 0 % (ref 0–5)
HCT: 38.7 % (ref 36.0–46.0)
HEMOGLOBIN: 13.1 g/dL (ref 12.0–15.0)
Lymphocytes Relative: 27 % (ref 12–46)
Lymphs Abs: 1.6 10*3/uL (ref 0.7–4.0)
MCH: 29.2 pg (ref 26.0–34.0)
MCHC: 33.9 g/dL (ref 30.0–36.0)
MCV: 86.4 fL (ref 78.0–100.0)
Monocytes Absolute: 1.1 10*3/uL — ABNORMAL HIGH (ref 0.1–1.0)
Monocytes Relative: 19 % — ABNORMAL HIGH (ref 3–12)
NEUTROS PCT: 54 % (ref 43–77)
Neutro Abs: 3.1 10*3/uL (ref 1.7–7.7)
Platelets: 230 10*3/uL (ref 150–400)
RBC: 4.48 MIL/uL (ref 3.87–5.11)
RDW: 11.3 % — ABNORMAL LOW (ref 11.5–15.5)
WBC: 5.8 10*3/uL (ref 4.0–10.5)

## 2014-05-23 LAB — LIPASE, BLOOD: Lipase: 77 U/L — ABNORMAL HIGH (ref 11–59)

## 2014-05-23 LAB — TSH: TSH: <0.01 u[IU]/mL — ABNORMAL LOW (ref 0.350–4.500)

## 2014-05-23 MED ORDER — MORPHINE SULFATE 4 MG/ML IJ SOLN: 4.0000 mg | INTRAMUSCULAR | Status: DC | PRN

## 2014-05-23 MED ORDER — SODIUM CHLORIDE 0.9 % IV SOLN
Freq: Once | INTRAVENOUS | Status: AC
  Administered 2014-05-23: 15:00:00 via INTRAVENOUS

## 2014-05-23 MED ORDER — SODIUM CHLORIDE 0.9 % IV SOLN
INTRAVENOUS | Status: DC
  Administered 2014-05-23 – 2014-05-25 (×2): via INTRAVENOUS
  Administered 2014-05-26: 1 mL via INTRAVENOUS

## 2014-05-23 MED ORDER — PANTOPRAZOLE SODIUM 40 MG IV SOLR
40.0000 mg | Freq: Two times a day (BID) | INTRAVENOUS | Status: DC
  Administered 2014-05-23 – 2014-05-25 (×4): 40 mg via INTRAVENOUS
  Filled 2014-05-23 (×4): qty 40

## 2014-05-23 MED ORDER — ENOXAPARIN SODIUM 40 MG/0.4ML ~~LOC~~ SOLN
40.0000 mg | SUBCUTANEOUS | Status: DC
  Administered 2014-05-24: 40 mg via SUBCUTANEOUS
  Filled 2014-05-23: qty 0.4

## 2014-05-23 MED ORDER — ONDANSETRON HCL 4 MG/2ML IJ SOLN
4.0000 mg | Freq: Four times a day (QID) | INTRAMUSCULAR | Status: DC | PRN
  Administered 2014-05-24: 4 mg via INTRAVENOUS

## 2014-05-23 MED ORDER — ONDANSETRON HCL 4 MG/2ML IJ SOLN
4.0000 mg | Freq: Once | INTRAMUSCULAR | Status: AC
  Administered 2014-05-23: 4 mg via INTRAVENOUS
  Filled 2014-05-23: qty 2

## 2014-05-23 MED ORDER — SODIUM CHLORIDE 0.9 % IV SOLN: INTRAVENOUS | Status: DC

## 2014-05-23 MED ORDER — SODIUM CHLORIDE 0.9 % IV BOLUS (SEPSIS)
1500.0000 mL | Freq: Once | INTRAVENOUS | Status: AC
  Administered 2014-05-23: 1500 mL via INTRAVENOUS

## 2014-05-23 MED ORDER — SODIUM CHLORIDE 0.9 % IV SOLN
Freq: Once | INTRAVENOUS | Status: AC
  Administered 2014-05-23: 12:00:00 via INTRAVENOUS

## 2014-05-23 MED ORDER — HYDRALAZINE HCL 20 MG/ML IJ SOLN: 5.0000 mg | INTRAMUSCULAR | Status: DC | PRN

## 2014-05-23 MED ORDER — PROMETHAZINE HCL 25 MG/ML IJ SOLN
12.5000 mg | Freq: Once | INTRAMUSCULAR | Status: AC
  Administered 2014-05-23: 12.5 mg via INTRAVENOUS
  Filled 2014-05-23: qty 1

## 2014-05-23 MED ORDER — ONDANSETRON HCL 4 MG/2ML IJ SOLN
4.0000 mg | Freq: Four times a day (QID) | INTRAMUSCULAR | Status: DC | PRN
  Filled 2014-05-23 (×3): qty 2

## 2014-05-23 MED ORDER — HYDROMORPHONE HCL 1 MG/ML IJ SOLN
1.0000 mg | Freq: Once | INTRAMUSCULAR | Status: AC
  Administered 2014-05-23: 1 mg via INTRAVENOUS
  Filled 2014-05-23: qty 1

## 2014-05-23 MED ORDER — HYDROMORPHONE HCL 1 MG/ML IJ SOLN
1.0000 mg | INTRAMUSCULAR | Status: DC | PRN
  Administered 2014-05-25: 1 mg via INTRAVENOUS
  Filled 2014-05-23: qty 1

## 2014-05-23 MED ORDER — SODIUM CHLORIDE 0.9 % IV SOLN
INTRAVENOUS | Status: DC
  Administered 2014-05-23: 15:00:00 via INTRAVENOUS

## 2014-05-23 MED ORDER — ONDANSETRON HCL 4 MG PO TABS: 4.0000 mg | ORAL_TABLET | Freq: Four times a day (QID) | ORAL | Status: DC | PRN

## 2014-05-23 NOTE — ED Provider Notes (Signed)
CSN: [id number]     Arrival date & time 05/23/14  1051 History   First MD Initiated Contact with Patient 05/23/14 1146     Chief Complaint  Patient presents with  . Emesis     (Consider location/radiation/quality/duration/timing/severity/associated sxs/prior Treatment) Patient is a 49 y.o. female presenting with vomiting. The history is provided by the patient and the spouse.  Emesis Severity:  Severe Duration:  3 weeks Timing:  Intermittent Quality:  Stomach contents Feeding tolerance: nothing. Progression:  Worsening Chronicity: 3 week duration. Recent urination:  Decreased Relieved by:  Nothing Associated symptoms: abdominal pain    Curtis Uriarte is a 49 y.o. female who presents to the ED with nausea and vomiting x 3 weeks. This is her third ED visit for the same symptoms. She had an endoscopy yesterday and was told it was ok. She reports weight loss from 138 down to 121 since the symptoms started. She complains of severe weakness. She has had CT scan on previous ED visit 2 weeks ago. The patient's husband states that she has had only 2 days that she felt any better at all. One day she took a bite of steak and started vomiting immediately and another day she at a bite of pizza and vomited and had abdominal pain. He is concerned about her weakness and dehydration.   Past Medical History  Diagnosis Date  . Hypertension   . Asthma    Past Surgical History  Procedure Laterality Date  . Abdominal hysterectomy    . Ovarian cyst surgery     No family history on file. History  Substance Use Topics  . Smoking status: Never Smoker   . Smokeless tobacco: Not on file  . Alcohol Use: No   OB History    No data available     Review of Systems  Constitutional: Positive for unexpected weight change.  Gastrointestinal: Positive for nausea, vomiting and abdominal pain.  Genitourinary: Positive for decreased urine volume.  Neurological: Positive for dizziness, weakness (generalized)  and light-headedness.  all other systems negative    Allergies  Contrast media; Iohexol; and Vicodin  Home Medications   Prior to Admission medications   Medication Sig Start Date End Date Taking? Authorizing Provider  ferrous sulfate 325 (65 FE) MG tablet Take 325 mg by mouth every other day.   Yes Historical Provider, MD  hydrochlorothiazide (HYDRODIURIL) 25 MG tablet Take 25 mg by mouth daily. 04/28/14  Yes Historical Provider, MD  hyoscyamine (LEVSIN, ANASPAZ) 0.125 MG tablet Take 1 tablet by mouth every 4 (four) hours as needed for cramping.  05/04/14  Yes Historical Provider, MD  Ibuprofen-Diphenhydramine Cit (IBUPROFEN PM PO) Take 1 tablet by mouth at bedtime as needed (pain/sleep).    Yes Historical Provider, MD  Multiple Vitamin (MULITIVITAMIN WITH MINERALS) TABS Take 1 tablet by mouth daily.   Yes Historical Provider, MD  naproxen sodium (ANAPROX) 220 MG tablet Take 220 mg by mouth 2 (two) times daily as needed (pain).    Yes Historical Provider, MD  ondansetron (ZOFRAN ODT) 4 MG disintegrating tablet Take 1 tablet (4 mg total) by mouth every 8 (eight) hours as needed. 05/11/14  Yes Fredia Sorrow, MD  promethazine (PHENERGAN) 25 MG tablet Take 1 tablet (25 mg total) by mouth every 6 (six) hours as needed. Patient taking differently: Take 25 mg by mouth every 4 (four) hours as needed for nausea or vomiting.  05/18/14  Yes Fredia Sorrow, MD  ranitidine (ZANTAC) 150 MG tablet Take 150 mg by  mouth 2 (two) times daily.   Yes Historical Provider, MD   BP 121/74 mmHg  Pulse 111  Temp(Src) 98.4 F (36.9 C) (Oral)  Resp 22  Ht 5\' 4"  (1.626 m)  Wt 121 lb (54.885 kg)  BMI 20.76 kg/m2  SpO2 100% Physical Exam  Constitutional: She is oriented to person, place, and time. She appears well-developed and well-nourished. No distress.  Appears very uncomfortable  HENT:  Head: Normocephalic.  Eyes: EOM are normal.  Neck: Neck supple.  Cardiovascular: Tachycardia present.    Pulmonary/Chest: Effort normal and breath sounds normal. She has no wheezes. She has no rales.  Abdominal: Soft. Bowel sounds are increased. There is generalized tenderness. There is no CVA tenderness.  Musculoskeletal: Normal range of motion.  Neurological: She is alert and oriented to person, place, and time. No cranial nerve deficit.  Skin: Skin is warm and dry.  Psychiatric: She has a normal mood and affect. Her behavior is normal.  Nursing note and vitals reviewed.   ED Course  Procedures (including critical care time) Dr. Vanita Panda in to examine the patient and request CT be repeated today and feels patient will need admission since she remains tachycardic, has had a 17 pound weight loss and continues to have n/v and pain despite IV hydration, Zofran, Phenergan and Dilaudid.  Labs Review Results for orders placed or performed during the hospital encounter of 05/23/14 (from the past 24 hour(s))  CBC with Differential     Status: Abnormal   Collection Time: 05/23/14 12:08 PM  Result Value Ref Range   WBC 5.8 4.0 - 10.5 K/uL   RBC 4.48 3.87 - 5.11 MIL/uL   Hemoglobin 13.1 12.0 - 15.0 g/dL   HCT 38.7 36.0 - 46.0 %   MCV 86.4 78.0 - 100.0 fL   MCH 29.2 26.0 - 34.0 pg   MCHC 33.9 30.0 - 36.0 g/dL   RDW 11.3 (L) 11.5 - 15.5 %   Platelets 230 150 - 400 K/uL   Neutrophils Relative % 54 43 - 77 %   Neutro Abs 3.1 1.7 - 7.7 K/uL   Lymphocytes Relative 27 12 - 46 %   Lymphs Abs 1.6 0.7 - 4.0 K/uL   Monocytes Relative 19 (H) 3 - 12 %   Monocytes Absolute 1.1 (H) 0.1 - 1.0 K/uL   Eosinophils Relative 0 0 - 5 %   Eosinophils Absolute 0.0 0.0 - 0.7 K/uL   Basophils Relative 0 0 - 1 %   Basophils Absolute 0.0 0.0 - 0.1 K/uL  Comprehensive metabolic panel     Status: Abnormal   Collection Time: 05/23/14 12:08 PM  Result Value Ref Range   Sodium 137 135 - 145 mmol/L   Potassium 3.5 3.5 - 5.1 mmol/L   Chloride 104 96 - 112 mmol/L   CO2 26 19 - 32 mmol/L   Glucose, Bld 127 (H) 70 - 99  mg/dL   BUN 23 6 - 23 mg/dL   Creatinine, Ser 0.86 0.50 - 1.10 mg/dL   Calcium 10.3 8.4 - 10.5 mg/dL   Total Protein 7.7 6.0 - 8.3 g/dL   Albumin 4.1 3.5 - 5.2 g/dL   AST 20 0 - 37 U/L   ALT 16 0 - 35 U/L   Alkaline Phosphatase 54 39 - 117 U/L   Total Bilirubin 0.7 0.3 - 1.2 mg/dL   GFR calc non Af Amer 79 (L) >90 mL/min   GFR calc Af Amer >90 >90 mL/min   Anion gap 7 5 -  15  Lipase, blood     Status: Abnormal   Collection Time: 05/23/14 12:08 PM  Result Value Ref Range   Lipase 77 (H) 11 - 59 U/L    Imaging Review Ct Abdomen Pelvis Wo Contrast  05/23/2014   CLINICAL DATA:  49 year old female abdominal pain and vomiting. Endoscopy yesterday. Initial encounter.  EXAM: CT ABDOMEN AND PELVIS WITHOUT CONTRAST  TECHNIQUE: Multidetector CT imaging of the abdomen and pelvis was performed following the standard protocol without IV contrast.  COMPARISON:  CT Abdomen and Pelvis 05/11/2014 and earlier  FINDINGS: minor lung base atelectasis. No pericardial or pleural effusion. No acute osseous abnormality identified.  Diminutive bladder. No pelvic free fluid. Uterus surgically absent. Stable right ovary.  No large bowel inflammation identified. Oral contrast has reached the terminal ileum. Appendix not identified today. No dilated small bowel. Negative stomach. No definite duodenum inflammation.  Noncontrast liver, gallbladder, spleen pancreas, adrenal glands, and kidneys appear stable and within normal limits. No abdominal free fluid or free air.  IMPRESSION: No definite acute or inflammatory process identified in the abdomen or pelvis in the absence of IV contrast.   Electronically Signed   By: Genevie Ann M.D.   On: 05/23/2014 17:13   US Abdomen Limited  05/23/2014   CLINICAL DATA:  49 year old female with subacute right upper quadrant pain, vomiting and weight loss. Initial encounter.  EXAM: US ABDOMEN LIMITED - RIGHT UPPER QUADRANT  COMPARISON:  05/11/2014 and prior CTs.  FINDINGS: Gallbladder:  The  gallbladder is unremarkable. There is no evidence of cholelithiasis or acute cholecystitis.  Common bile duct:  Diameter: 3.0 mm. There is no evidence of intrahepatic or extrahepatic biliary dilatation.  Liver:  No focal lesion identified. Within normal limits in parenchymal echogenicity.  There is no evidence of ascites within the right upper abdomen.  IMPRESSION: Unremarkable right upper quadrant abdominal ultrasound.   Electronically Signed   By: Margarette Canada M.D.   On: 05/23/2014 13:21    MDM  49 y.o. female with n/v and abdominal pain with a weight loss of 18 pounds over the past 3 weeks. I discussed this case with Dr. Barbaraann Faster and he will see her in the ED for admission. Thyroid panel pending and urine pending due to patient being unable to void. Patient on her third liter of fluid. She has voided once here in the ED earlier today.   Final diagnoses:  Nausea and vomiting, vomiting of unspecified type  Generalized abdominal pain  Dehydration  Weight loss       Ashley Murrain, NP 05/23/14 1746  Carmin Muskrat, MD 05/24/14 1336

## 2014-05-23 NOTE — ED Notes (Signed)
Call to [place], no answer.  Call to radiology to notify them that [place] will be ordered.  They are calling us tech

## 2014-05-23 NOTE — H&P (Addendum)
[facility] History and Physical  Sylvia Helms VPX:[id number] DOB: [birth year] DOA: 05/23/2014  Referring physician: [place] [facility] PCP: No primary care provider on file.   Chief Complaint: Abd pain    HPI: TRUE Shackleford is a 49 y.o. female  3 weeks of ongoing and intermittent abdominal pain. Associated with nausea, diarrhea (only in the first week), vomiting. Emesis and diarrhea were non-bloody. Getting worse. Seen in emergency room on 05/11/2014 and 05/18/2014 with similar symptoms. Diagnosed with viral gastroenteritis. Zofran was some improvement. Worse with meals. Associated with anorexia and weight loss of 17 pounds. Feeling progressively more weak. Denies fevers, dysuria, no discharge, frequency, back pain, chest pain, shortness of breath, palpitations.   Review of Systems:  Constitutional:  No weight loss, night sweats, Fevers, chills.  HEENT:  No headaches, Difficulty swallowing,Tooth/dental problems,Sore throat,  No sneezing, itching, ear ache, nasal congestion, post nasal drip,  Cardio-vascular:  No chest pain, Orthopnea, PND, swelling in lower extremities, anasarca, dizziness, palpitations  GI: Per HPI Resp:   No shortness of breath with exertion or at rest. No excess mucus, no productive cough, No non-productive cough, No coughing up of blood.No change in color of mucus.No wheezing.No chest wall deformity  Skin:  no rash or lesions.  GU:  no dysuria, change in color of urine, no urgency or frequency. No flank pain.  Musculoskeletal:   No joint pain or swelling. No decreased range of motion. No back pain.  Psych:  No change in mood or affect. No depression or anxiety. No memory loss.   Past Medical History  Diagnosis Date  . Hypertension   . Asthma    Past Surgical History  Procedure Laterality Date  . Abdominal hysterectomy    . Ovarian cyst surgery     Social History:  reports that she has never smoked. She does not have any smokeless tobacco  history on file. She reports that she does not drink alcohol or use illicit drugs.  Allergies  Allergen Reactions  . Contrast Media [Iodinated Diagnostic Agents]   . Iohexol Nausea And Vomiting    Pt. Vomited immediately after injection of Omni 350.    . Vicodin [Hydrocodone-Acetaminophen] Rash    Family History  Problem Relation Age of Onset  . Diabetes Mother   . Diabetes Father   . Hypertension Mother   . Hypertension Father      Prior to Admission medications   Medication Sig Start Date End Date Taking? Authorizing Provider  ferrous sulfate 325 (65 FE) MG tablet Take 325 mg by mouth every other day.   Yes Historical Provider, MD  hydrochlorothiazide (HYDRODIURIL) 25 MG tablet Take 25 mg by mouth daily. 04/28/14  Yes Historical Provider, MD  hyoscyamine (LEVSIN, ANASPAZ) 0.125 MG tablet Take 1 tablet by mouth every 4 (four) hours as needed for cramping.  05/04/14  Yes Historical Provider, MD  Ibuprofen-Diphenhydramine Cit (IBUPROFEN PM PO) Take 1 tablet by mouth at bedtime as needed (pain/sleep).    Yes Historical Provider, MD  Multiple Vitamin (MULITIVITAMIN WITH MINERALS) TABS Take 1 tablet by mouth daily.   Yes Historical Provider, MD  naproxen sodium (ANAPROX) 220 MG tablet Take 220 mg by mouth 2 (two) times daily as needed (pain).    Yes Historical Provider, MD  ondansetron (ZOFRAN ODT) 4 MG disintegrating tablet Take 1 tablet (4 mg total) by mouth every 8 (eight) hours as needed. 05/11/14  Yes Fredia Sorrow, MD  promethazine (PHENERGAN) 25 MG tablet Take 1 tablet (25 mg  total) by mouth every 6 (six) hours as needed. Patient taking differently: Take 25 mg by mouth every 4 (four) hours as needed for nausea or vomiting.  05/18/14  Yes Fredia Sorrow, MD  ranitidine (ZANTAC) 150 MG tablet Take 150 mg by mouth 2 (two) times daily.   Yes Historical Provider, MD   Physical Exam: Filed Vitals:   05/23/14 1335 05/23/14 1400 05/23/14 1514 05/23/14 1600  BP: 122/83 116/71 117/74 121/74    Pulse: 117 121 111 111  Temp: 98.5 F (36.9 C)  98.4 F (36.9 C)   TempSrc: Oral  Oral   Resp: 22  22   Height:      Weight:      SpO2: 100% 100% 100% 100%    Wt Readings from Last 3 Encounters:  05/23/14 54.885 kg (121 lb)  05/18/14 58.968 kg (130 lb)  05/11/14 59.421 kg (131 lb)    General: Appears ill,  Eyes:  PERRL, normal lids, irises & conjunctiva ENT: Dry mucus membranes Neck:  no LAD, masses or thyromegaly Cardiovascular:  RRR, no m/r/g. No LE edema. Telemetry:  SR, no arrhythmias  Respiratory:  CTA bilaterally, no w/r/r. Normal respiratory effort. Abdomen: Epigastric ttp. NABS, non-distended Skin:  no rash or induration seen on limited exam Musculoskeletal:  grossly normal tone BUE/BLE Psychiatric:  grossly normal mood and affect, speech fluent and appropriate Neurologic:  grossly non-focal.          Labs on Admission:  Basic Metabolic Panel:  Recent Labs Lab 05/18/14 1113 05/23/14 1208  NA 137 137  K 3.3* 3.5  CL 106 104  CO2 25 26  GLUCOSE 127* 127*  BUN 14 23  CREATININE 0.69 0.86  CALCIUM 10.0 10.3   Liver Function Tests:  Recent Labs Lab 05/18/14 1113 05/23/14 1208  AST 22 20  ALT 15 16  ALKPHOS 48 54  BILITOT 0.7 0.7  PROT 7.4 7.7  ALBUMIN 3.8 4.1    Recent Labs Lab 05/18/14 1113 05/23/14 1208  LIPASE 38 77*   No results for input(s): AMMONIA in the last 168 hours. CBC:  Recent Labs Lab 05/18/14 1113 05/23/14 1208  WBC 7.4 5.8  NEUTROABS 5.7 3.1  HGB 12.2 13.1  HCT 36.7 38.7  MCV 85.9 86.4  PLT 205 230   Cardiac Enzymes: No results for input(s): CKTOTAL, CKMB, CKMBINDEX, TROPONINI in the last 168 hours.  BNP (last 3 results) No results for input(s): BNP in the last 8760 hours.  ProBNP (last 3 results) No results for input(s): PROBNP in the last 8760 hours.  CBG: No results for input(s): GLUCAP in the last 168 hours.  Radiological Exams on Admission: Ct Abdomen Pelvis Wo Contrast  05/23/2014   CLINICAL  DATA:  49 year old female abdominal pain and vomiting. Endoscopy yesterday. Initial encounter.  EXAM: CT ABDOMEN AND PELVIS WITHOUT CONTRAST  TECHNIQUE: Multidetector CT imaging of the abdomen and pelvis was performed following the standard protocol without IV contrast.  COMPARISON:  CT Abdomen and Pelvis 05/11/2014 and earlier  FINDINGS: minor lung base atelectasis. No pericardial or pleural effusion. No acute osseous abnormality identified.  Diminutive bladder. No pelvic free fluid. Uterus surgically absent. Stable right ovary.  No large bowel inflammation identified. Oral contrast has reached the terminal ileum. Appendix not identified today. No dilated small bowel. Negative stomach. No definite duodenum inflammation.  Noncontrast liver, gallbladder, spleen pancreas, adrenal glands, and kidneys appear stable and within normal limits. No abdominal free fluid or free air.  IMPRESSION: No definite acute or  inflammatory process identified in the abdomen or pelvis in the absence of IV contrast.   Electronically Signed   By: Genevie Ann M.D.   On: 05/23/2014 17:13   US Abdomen Limited  05/23/2014   CLINICAL DATA:  49 year old female with subacute right upper quadrant pain, vomiting and weight loss. Initial encounter.  EXAM: US ABDOMEN LIMITED - RIGHT UPPER QUADRANT  COMPARISON:  05/11/2014 and prior CTs.  FINDINGS: Gallbladder:  The gallbladder is unremarkable. There is no evidence of cholelithiasis or acute cholecystitis.  Common bile duct:  Diameter: 3.0 mm. There is no evidence of intrahepatic or extrahepatic biliary dilatation.  Liver:  No focal lesion identified. Within normal limits in parenchymal echogenicity.  There is no evidence of ascites within the right upper abdomen.  IMPRESSION: Unremarkable right upper quadrant abdominal ultrasound.   Electronically Signed   By: Margarette Canada M.D.   On: 05/23/2014 13:21    EKG: none  Assessment/Plan Principal Problem:   Pancreatitis Active Problems:   Nausea &  vomiting   Essential hypertension   Rapid weight loss   Tachycardia   Dehydration   GERD (gastroesophageal reflux disease)  Abd pain: May have started as viral gastro but now w/ pancreatitis. Multiple CT scans and abd [place] w/o acute process or stones noted other than mild gastritis. No stones noted and denies ETOH intake. Nml WBC. 17lb wt loss in past 3 wks.  - Admit - IVF NS 131ml/hr - Morphine - NPO - Zofran +/- GI consult if not improving - Cdiff - Lipid panel in am  HTN: normotensive - hole home HCTZ until taking PO - hydralazine IV PRN SBP >180  Dehydration: Pt tachy and w/ dry mucus membranes. 2.5 L NS bolus in ED.  - NS as above - 1.5 L NS abolus  Tachy: likely from dehydration. 2.5L NS in ED w/ improvement. No sign of sepsis or arrhythmia on monitor - IVF as above - EKG  Hyperglycemia: 127 on 2/8 and at time of admission. Likely from illness. Strong family h/o DM - A1c  GERD:  - change to IV PPI   Code Status: FULL DVT Prophylaxis: Lovenox Family Communication: None Disposition Plan: pending improvement   MERRELL, DAVID Lenna Sciara, MD Family Medicine [facility] [email or web address] Password [email or web address]

## 2014-05-23 NOTE — ED Notes (Signed)
Went to place second IV and draw blood.  Phlebotomy had just drawn blood and pt does not want to be stuck for IV, current IV is short 24g in hand but is running well at this time

## 2014-05-23 NOTE — ED Notes (Signed)
Call to dep [phone], RN will call me back for report

## 2014-05-23 NOTE — ED Notes (Signed)
Pt voided 70cc on bedpan, urine cloudy

## 2014-05-23 NOTE — ED Notes (Addendum)
Sever vomiting x 3 weeks. 3rd visit for the same. Endoscopy done yesterday. Pt has lost from 138-121lb as of yesterday since symptoms began. Severe weakness, pt was unable to get form wheelchair to the bed without assistance.

## 2014-05-23 NOTE — ED Notes (Signed)
Patient transported to CT 

## 2014-05-24 ENCOUNTER — Observation Stay (HOSPITAL_COMMUNITY): Payer: 59

## 2014-05-24 DIAGNOSIS — J45909 Unspecified asthma, uncomplicated: Secondary | ICD-10-CM | POA: Diagnosis present

## 2014-05-24 DIAGNOSIS — R112 Nausea with vomiting, unspecified: Secondary | ICD-10-CM | POA: Diagnosis present

## 2014-05-24 DIAGNOSIS — Z833 Family history of diabetes mellitus: Secondary | ICD-10-CM | POA: Diagnosis not present

## 2014-05-24 DIAGNOSIS — K219 Gastro-esophageal reflux disease without esophagitis: Secondary | ICD-10-CM | POA: Diagnosis present

## 2014-05-24 DIAGNOSIS — K2980 Duodenitis without bleeding: Secondary | ICD-10-CM | POA: Diagnosis present

## 2014-05-24 DIAGNOSIS — Z90710 Acquired absence of both cervix and uterus: Secondary | ICD-10-CM | POA: Diagnosis not present

## 2014-05-24 DIAGNOSIS — Z91041 Radiographic dye allergy status: Secondary | ICD-10-CM | POA: Diagnosis not present

## 2014-05-24 DIAGNOSIS — Z8249 Family history of ischemic heart disease and other diseases of the circulatory system: Secondary | ICD-10-CM | POA: Diagnosis not present

## 2014-05-24 DIAGNOSIS — E860 Dehydration: Secondary | ICD-10-CM | POA: Diagnosis present

## 2014-05-24 DIAGNOSIS — K2970 Gastritis, unspecified, without bleeding: Secondary | ICD-10-CM | POA: Diagnosis present

## 2014-05-24 DIAGNOSIS — E876 Hypokalemia: Secondary | ICD-10-CM | POA: Diagnosis present

## 2014-05-24 DIAGNOSIS — E0590 Thyrotoxicosis, unspecified without thyrotoxic crisis or storm: Secondary | ICD-10-CM | POA: Diagnosis present

## 2014-05-24 DIAGNOSIS — K311 Adult hypertrophic pyloric stenosis: Secondary | ICD-10-CM | POA: Diagnosis present

## 2014-05-24 DIAGNOSIS — R109 Unspecified abdominal pain: Secondary | ICD-10-CM

## 2014-05-24 DIAGNOSIS — N390 Urinary tract infection, site not specified: Secondary | ICD-10-CM | POA: Diagnosis present

## 2014-05-24 DIAGNOSIS — I10 Essential (primary) hypertension: Secondary | ICD-10-CM | POA: Diagnosis present

## 2014-05-24 LAB — CBC
HEMATOCRIT: 28 % — AB (ref 36.0–46.0)
HEMOGLOBIN: 9.2 g/dL — AB (ref 12.0–15.0)
MCH: 29.2 pg (ref 26.0–34.0)
MCHC: 32.9 g/dL (ref 30.0–36.0)
MCV: 88.9 fL (ref 78.0–100.0)
PLATELETS: 169 10*3/uL (ref 150–400)
RBC: 3.15 MIL/uL — ABNORMAL LOW (ref 3.87–5.11)
RDW: 11.6 % (ref 11.5–15.5)
WBC: 5.3 10*3/uL (ref 4.0–10.5)

## 2014-05-24 LAB — COMPREHENSIVE METABOLIC PANEL
ALT: 15 U/L (ref 0–35)
ANION GAP: 5 (ref 5–15)
AST: 18 U/L (ref 0–37)
Albumin: 2.9 g/dL — ABNORMAL LOW (ref 3.5–5.2)
Alkaline Phosphatase: 38 U/L — ABNORMAL LOW (ref 39–117)
BUN: 12 mg/dL (ref 6–23)
CO2: 19 mmol/L (ref 19–32)
Calcium: 8.6 mg/dL (ref 8.4–10.5)
Chloride: 114 mmol/L — ABNORMAL HIGH (ref 96–112)
Creatinine, Ser: 0.62 mg/dL (ref 0.50–1.10)
GFR calc Af Amer: >90 mL/min (ref >90)
GFR calc non Af Amer: >90 mL/min (ref >90)
GLUCOSE: 69 mg/dL — AB (ref 70–99)
Potassium: 3.8 mmol/L (ref 3.5–5.1)
Sodium: 138 mmol/L (ref 135–145)
TOTAL PROTEIN: 5.4 g/dL — AB (ref 6.0–8.3)
Total Bilirubin: 0.9 mg/dL (ref 0.3–1.2)

## 2014-05-24 LAB — LIPID PANEL
CHOLESTEROL: 102 mg/dL (ref 0–200)
HDL: 20 mg/dL — AB (ref >39)
LDL Cholesterol: 71 mg/dL (ref 0–99)
TRIGLYCERIDES: 54 mg/dL (ref <150)
Total CHOL/HDL Ratio: 5.1 RATIO
VLDL: 11 mg/dL (ref 0–40)

## 2014-05-24 MED ORDER — PROMETHAZINE HCL 25 MG/ML IJ SOLN
12.5000 mg | Freq: Four times a day (QID) | INTRAMUSCULAR | Status: DC | PRN
  Administered 2014-05-24 (×3): 12.5 mg via INTRAVENOUS
  Filled 2014-05-24 (×3): qty 1

## 2014-05-24 MED ORDER — ACETAMINOPHEN 325 MG PO TABS
650.0000 mg | ORAL_TABLET | Freq: Four times a day (QID) | ORAL | Status: DC | PRN
  Administered 2014-05-24 – 2014-05-27 (×2): 650 mg via ORAL
  Filled 2014-05-24 (×2): qty 2

## 2014-05-24 MED ORDER — CIPROFLOXACIN IN D5W 400 MG/200ML IV SOLN
400.0000 mg | Freq: Two times a day (BID) | INTRAVENOUS | Status: DC
Start: 2014-05-24 — End: 2014-05-25
  Administered 2014-05-24 – 2014-05-25 (×3): 400 mg via INTRAVENOUS
  Filled 2014-05-24 (×3): qty 200

## 2014-05-24 MED ORDER — METRONIDAZOLE IN NACL 5-0.79 MG/ML-% IV SOLN
500.0000 mg | Freq: Three times a day (TID) | INTRAVENOUS | Status: DC
  Administered 2014-05-24 – 2014-05-25 (×3): 500 mg via INTRAVENOUS
  Filled 2014-05-24 (×3): qty 100

## 2014-05-24 NOTE — Progress Notes (Signed)
UR completed 

## 2014-05-24 NOTE — Progress Notes (Signed)
[facility] PROGRESS NOTE  [name] EHM:[id number] DOB: 1965/04/14 DOA: 05/23/2014 PCP: No primary care provider on file.  Assessment/Plan: 1. Abdominal pain. Initially felt to be related to pancreatitis, although with a near normal lipase and then no radiographic evidence of pancreatitis, I feel this diagnosis would be unlikely. May possibly be related to an underlying gastroenteritis. UTI may also be playing a role. The patient is having fevers. She is no longer having any diarrhea. She recently had an EGD on 2/11 in [place] by Dr. Oletta Lamas which was reportedly normal. Abdominal ultrasound/CT abdomen did not show any acute findings at this time. Will request GI input to see if any further workup is necessary. Start the patient on clear liquids. 2. Possible UTI. Start the patient on antibiotics and check urine culture. 3. Hypertension. Hydrochlorothiazide currently on hold. Continue to follow blood pressure. 4. Dehydration. Related to vomiting and diarrhea. Continue with IV hydration. 5. GERD. Continue PPI  Code Status: full code Family Communication: discussed with patient and husband Disposition Plan: discharge home once improved   Consultants:    Procedures:    Antibiotics:    HPI/Subjective: No vomiting, abdominal pain improving, no bowel movements. Patient has been having fevers since admission.  Objective: Filed Vitals:   05/24/14 1245  BP: 128/63  Pulse: 107  Temp: 98.3 F (36.8 C)  Resp: 20    Intake/Output Summary (Last 24 hours) at 05/24/14 1846 Last data filed at 05/24/14 0600  Gross per 24 hour  Intake 922.92 ml  Output      0 ml  Net 922.92 ml   Filed Weights   05/23/14 1107 05/23/14 1111 05/23/14 2005  Weight: 58.968 kg (130 lb) 54.885 kg (121 lb) 58.3 kg (128 lb 8.5 oz)    Exam:   General:  NAD  Cardiovascular: S1, S2 tachycardic  Respiratory: CTA B  Abdomen: soft, mildly tender in lower abdomen, bs+  Musculoskeletal: no  edema b/l   Data Reviewed: Basic Metabolic Panel:  Recent Labs Lab 05/18/14 1113 05/23/14 1208 05/24/14 0601  NA 137 137 138  K 3.3* 3.5 3.8  CL 106 104 114*  CO2 25 26 19   GLUCOSE 127* 127* 69*  BUN 14 23 12   CREATININE 0.69 0.86 0.62  CALCIUM 10.0 10.3 8.6   Liver Function Tests:  Recent Labs Lab 05/18/14 1113 05/23/14 1208 05/24/14 0601  AST 22 20 18   ALT 15 16 15   ALKPHOS 48 54 38*  BILITOT 0.7 0.7 0.9  PROT 7.4 7.7 5.4*  ALBUMIN 3.8 4.1 2.9*    Recent Labs Lab 05/18/14 1113 05/23/14 1208  LIPASE 38 77*   No results for input(s): AMMONIA in the last 168 hours. CBC:  Recent Labs Lab 05/18/14 1113 05/23/14 1208 05/24/14 0601  WBC 7.4 5.8 5.3  NEUTROABS 5.7 3.1  --   HGB 12.2 13.1 9.2*  HCT 36.7 38.7 28.0*  MCV 85.9 86.4 88.9  PLT 205 230 169   Cardiac Enzymes: No results for input(s): CKTOTAL, CKMB, CKMBINDEX, TROPONINI in the last 168 hours. BNP (last 3 results) No results for input(s): BNP in the last 8760 hours.  ProBNP (last 3 results) No results for input(s): PROBNP in the last 8760 hours.  CBG: No results for input(s): GLUCAP in the last 168 hours.  No results found for this or any previous visit (from the past 240 hour(s)).   Studies: Ct Abdomen Pelvis Wo Contrast  05/23/2014   CLINICAL DATA:  49 year old female abdominal pain and vomiting. Endoscopy yesterday.  Initial encounter.  EXAM: CT ABDOMEN AND PELVIS WITHOUT CONTRAST  TECHNIQUE: Multidetector CT imaging of the abdomen and pelvis was performed following the standard protocol without IV contrast.  COMPARISON:  CT Abdomen and Pelvis 05/11/2014 and earlier  FINDINGS: minor lung base atelectasis. No pericardial or pleural effusion. No acute osseous abnormality identified.  Diminutive bladder. No pelvic free fluid. Uterus surgically absent. Stable right ovary.  No large bowel inflammation identified. Oral contrast has reached the terminal ileum. Appendix not identified today. No dilated  small bowel. Negative stomach. No definite duodenum inflammation.  Noncontrast liver, gallbladder, spleen pancreas, adrenal glands, and kidneys appear stable and within normal limits. No abdominal free fluid or free air.  IMPRESSION: No definite acute or inflammatory process identified in the abdomen or pelvis in the absence of IV contrast.   Electronically Signed   By: Genevie Ann M.D.   On: 05/23/2014 17:13   US Abdomen Limited  05/23/2014   CLINICAL DATA:  49 year old female with subacute right upper quadrant pain, vomiting and weight loss. Initial encounter.  EXAM: US ABDOMEN LIMITED - RIGHT UPPER QUADRANT  COMPARISON:  05/11/2014 and prior CTs.  FINDINGS: Gallbladder:  The gallbladder is unremarkable. There is no evidence of cholelithiasis or acute cholecystitis.  Common bile duct:  Diameter: 3.0 mm. There is no evidence of intrahepatic or extrahepatic biliary dilatation.  Liver:  No focal lesion identified. Within normal limits in parenchymal echogenicity.  There is no evidence of ascites within the right upper abdomen.  IMPRESSION: Unremarkable right upper quadrant abdominal ultrasound.   Electronically Signed   By: Margarette Canada M.D.   On: 05/23/2014 13:21   Dg Chest Port 1 View  05/24/2014   CLINICAL DATA:  Feeling very weak para nausea and vomiting, diarrhea for 3 weeks.  EXAM: PORTABLE CHEST - 1 VIEW  COMPARISON:  05/11/2014  FINDINGS: Heart size is normal. Lungs are clear. No pulmonary edema. Visualized osseous structures have a normal appearance.  IMPRESSION: No active disease.   Electronically Signed   By: Nolon Nations M.D.   On: 05/24/2014 15:08    Scheduled Meds: . ciprofloxacin  400 mg Intravenous Q12H  . enoxaparin (LOVENOX) injection  40 mg Subcutaneous Q24H  . metronidazole  500 mg Intravenous Q8H  . pantoprazole (PROTONIX) IV  40 mg Intravenous Q12H   Continuous Infusions: . sodium chloride 125 mL/hr at 05/23/14 2237    Principal Problem:   Pancreatitis Active Problems:    Nausea & vomiting   Essential hypertension   Rapid weight loss   Tachycardia   Dehydration   GERD (gastroesophageal reflux disease)    Time spent: 60mins    Kaitlynne Wenz  [facility] Pager [phone]. If 7PM-7AM, please contact night-coverage at [email or web address], password [facility] 05/24/2014, 6:46 PM  LOS: 0 days

## 2014-05-25 DIAGNOSIS — R509 Fever, unspecified: Secondary | ICD-10-CM

## 2014-05-25 DIAGNOSIS — R112 Nausea with vomiting, unspecified: Secondary | ICD-10-CM | POA: Diagnosis present

## 2014-05-25 LAB — COMPREHENSIVE METABOLIC PANEL
ALK PHOS: 39 U/L (ref 39–117)
ALT: 23 U/L (ref 0–35)
ANION GAP: 8 (ref 5–15)
AST: 27 U/L (ref 0–37)
Albumin: 3 g/dL — ABNORMAL LOW (ref 3.5–5.2)
BUN: 6 mg/dL (ref 6–23)
CALCIUM: 9.1 mg/dL (ref 8.4–10.5)
CHLORIDE: 109 mmol/L (ref 96–112)
CO2: 22 mmol/L (ref 19–32)
CREATININE: 0.48 mg/dL — AB (ref 0.50–1.10)
GFR calc Af Amer: >90 mL/min (ref >90)
GLUCOSE: 102 mg/dL — AB (ref 70–99)
Potassium: 3.2 mmol/L — ABNORMAL LOW (ref 3.5–5.1)
SODIUM: 139 mmol/L (ref 135–145)
TOTAL PROTEIN: 5.8 g/dL — AB (ref 6.0–8.3)
Total Bilirubin: 1 mg/dL (ref 0.3–1.2)

## 2014-05-25 LAB — CBC
HEMATOCRIT: 27.9 % — AB (ref 36.0–46.0)
HEMOGLOBIN: 9.4 g/dL — AB (ref 12.0–15.0)
MCH: 29 pg (ref 26.0–34.0)
MCHC: 33.7 g/dL (ref 30.0–36.0)
MCV: 86.1 fL (ref 78.0–100.0)
Platelets: 152 10*3/uL (ref 150–400)
RBC: 3.24 MIL/uL — AB (ref 3.87–5.11)
RDW: 11.2 % — ABNORMAL LOW (ref 11.5–15.5)
WBC: 4.6 10*3/uL (ref 4.0–10.5)

## 2014-05-25 LAB — HEMOGLOBIN A1C
Hgb A1c MFr Bld: 5.6 % (ref 4.8–5.6)
Mean Plasma Glucose: 114 mg/dL

## 2014-05-25 MED ORDER — PANTOPRAZOLE SODIUM 40 MG IV SOLR
40.0000 mg | Freq: Two times a day (BID) | INTRAVENOUS | Status: DC
  Administered 2014-05-25 – 2014-05-26 (×3): 40 mg via INTRAVENOUS
  Filled 2014-05-25 (×3): qty 40

## 2014-05-25 MED ORDER — ONDANSETRON HCL 4 MG PO TABS
4.0000 mg | ORAL_TABLET | ORAL | Status: DC | PRN
  Filled 2014-05-25: qty 1

## 2014-05-25 MED ORDER — POTASSIUM CHLORIDE CRYS ER 20 MEQ PO TBCR
40.0000 meq | EXTENDED_RELEASE_TABLET | Freq: Once | ORAL | Status: AC
  Administered 2014-05-25: 40 meq via ORAL
  Filled 2014-05-25: qty 2

## 2014-05-25 MED ORDER — ONDANSETRON HCL 4 MG/2ML IJ SOLN: 4.0000 mg | INTRAMUSCULAR | Status: DC | PRN

## 2014-05-25 MED ORDER — MORPHINE SULFATE 2 MG/ML IJ SOLN: 2.0000 mg | INTRAMUSCULAR | Status: DC | PRN

## 2014-05-25 MED ORDER — PROMETHAZINE HCL 25 MG/ML IJ SOLN: 6.2500 mg | Freq: Four times a day (QID) | INTRAMUSCULAR | Status: DC | PRN

## 2014-05-25 MED ORDER — BOOST / RESOURCE BREEZE PO LIQD
1.0000 | Freq: Three times a day (TID) | ORAL | Status: DC
  Administered 2014-05-25 – 2014-05-28 (×4): 1 via ORAL

## 2014-05-25 MED ORDER — ONDANSETRON HCL 4 MG/2ML IJ SOLN
4.0000 mg | Freq: Three times a day (TID) | INTRAMUSCULAR | Status: DC
  Administered 2014-05-25 – 2014-05-26 (×5): 4 mg via INTRAVENOUS
  Filled 2014-05-25 (×5): qty 2

## 2014-05-25 MED ORDER — ENOXAPARIN SODIUM 40 MG/0.4ML ~~LOC~~ SOLN
40.0000 mg | SUBCUTANEOUS | Status: DC
  Filled 2014-05-25 (×2): qty 0.4

## 2014-05-25 NOTE — Progress Notes (Signed)
INITIAL NUTRITION ASSESSMENT   INTERVENTION: Add Resource Breeze po TID as tolerated, each supplement provides 250 kcal and 9 grams of protein    NUTRITION DIAGNOSIS: Inadequate oral intake related to nausea and vomiting as evidenced by pt hx and nursing report .   Goal: Pt to meet >/= 90% of their estimated nutrition needs    Monitor:  Diet advancement, percent po meal and supplement intake, labs and wt changes  Reason for Assessment: Malnutrition Screen   49 y.o. female  ASSESSMENT: Pt continues to complain of nausea. Pt says her symptoms have been persistent the past month and that she has been to ED 2 x for IV-fluids due fluid loss from vomiting. Pt says she has not been to tolerate even juice or ginger ale. Her weight has decreased 6% in 45 days and 1.6% in 2 weeks trending toward significant. Husband expresses concern that pt will be d/c before her nausea is resolved. Pt does say she has not vomited thus far today.  No physical signs of malnutrition noted at this time.  Height: Ht Readings from Last 1 Encounters:  05/23/14 5\' 4"  (1.626 m)    Weight: Wt Readings from Last 1 Encounters:  05/23/14 128 lb 8.5 oz (58.3 kg)    Ideal Body Weight: 120#   % Ideal Body Weight: 108%  Wt Readings from Last 10 Encounters:  05/23/14 128 lb 8.5 oz (58.3 kg)  05/18/14 130 lb (58.968 kg)  05/11/14 131 lb (59.421 kg)  03/31/14 137 lb (62.143 kg)  04/30/11 125 lb (56.7 kg)    Usual Body Weight: 130-135#  % Usual Body Weight: 99%  BMI:  Body mass index is 22.05 kg/(m^2). normal range  Estimated Nutritional Needs: Kcal: 1700-1800 Protein: 65-70 gr Fluid: 1.7-1.8 liters daily  Skin: intact  Diet Order: Diet clear liquid  EDUCATION NEEDS: -Education needs addressed related to nausea    Intake/Output Summary (Last 24 hours) at 05/25/14 1004 Last data filed at 05/25/14 0500  Gross per 24 hour  Intake   1700 ml  Output      0 ml  Net   1700 ml    Last BM: 05/23/14    Labs:   Recent Labs Lab 05/23/14 1208 05/24/14 0601 05/25/14 0627  NA 137 138 139  K 3.5 3.8 3.2*  CL 104 114* 109  CO2 26 19 22   BUN 23 12 6   CREATININE 0.86 0.62 0.48*  CALCIUM 10.3 8.6 9.1  GLUCOSE 127* 69* 102*    CBG (last 3)  No results for input(s): GLUCAP in the last 72 hours.  Scheduled Meds: . ciprofloxacin  400 mg Intravenous Q12H  . enoxaparin (LOVENOX) injection  40 mg Subcutaneous Q24H  . metronidazole  500 mg Intravenous Q8H  . pantoprazole (PROTONIX) IV  40 mg Intravenous Q12H    Continuous Infusions: . sodium chloride 100 mL/hr at 05/25/14 0165    Past Medical History  Diagnosis Date  . Hypertension   . Asthma     Past Surgical History  Procedure Laterality Date  . Abdominal hysterectomy    . Ovarian cyst surgery      Colman Cater MS,RD,CSG,LDN Office: #[phone] Pager: [phone]

## 2014-05-25 NOTE — Progress Notes (Signed)
[facility] PROGRESS NOTE  [name] GXQ:[id number] DOB: June 22, 1965 DOA: 05/23/2014 PCP: No primary care provider on file.  Assessment/Plan: 1. Abdominal pain. Much improve this am.  Initially felt to be related to pancreatitis, although with a near normal lipase and then no radiographic evidence of pancreatitis. May possibly be related to an underlying gastroenteritis. UTI may also be playing a role. Max temp 99.9. Denies diarrhea. She recently had an EGD on 2/11 in [place] by Dr. Oletta Lamas which was reportedly normal. Abdominal ultrasound/CT abdomen did not show any acute findings. Await GI input to see if any further workup is necessary. Continues with nausea and tolerating only small amounts clear liquid. Will provide zofran before meals. Continue cipro and flagyl. Day #2 2. Possible UTI. Await urine culture. Max temp 99.8. Appears non-toxic. cipro day #2 3. Hypertension. Fair control.  Hydrochlorothiazide on hold. Continue to follow blood pressure. 4. Dehydration. Related to vomiting and diarrhea. Improved.  Continue with IV hydration. 5. GERD. Appears stable at baseline given #1.  Continue PPI. Monitor 6. Hypokalemia: mild. Related to decreased po intake. Will replete and recheck.   Code Status: full Family Communication: none present  Disposition Plan: home when ready. Hopefully tomorrow   Consultants:  GI  Procedures:  none  Antibiotics:  cipro 05/24/14>>  Flagyl 05/24/14>>  HPI/Subjective: Lying in bed eyes closed. Awakens to verbal stimuli. Somewhat lethargic. Denies abdominal pain. Denies recent loose stool. Reports nausea and difficulty "keeping down clear liquids".   Objective: Filed Vitals:   05/25/14 0615  BP: 141/81  Pulse: 96  Temp: 99.7 F (37.6 C)  Resp: 20    Intake/Output Summary (Last 24 hours) at 05/25/14 0906 Last data filed at 05/25/14 0500  Gross per 24 hour  Intake   1700 ml  Output      0 ml  Net   1700 ml   Filed Weights   05/23/14 1107 05/23/14 1111 05/23/14 2005  Weight: 58.968 kg (130 lb) 54.885 kg (121 lb) 58.3 kg (128 lb 8.5 oz)    Exam:   General:  Well nourished appears comfortable  Cardiovascular: RRR no MGR no LE edema  Respiratory: normal effort BS clear bilaterally no wheeze  Abdomen: flat soft mild diffuse tenderness no guarding no rebounding +BS  Musculoskeletal: joints without swelling/erythema   Data Reviewed: Basic Metabolic Panel:  Recent Labs Lab 05/18/14 1113 05/23/14 1208 05/24/14 0601 05/25/14 0627  NA 137 137 138 139  K 3.3* 3.5 3.8 3.2*  CL 106 104 114* 109  CO2 25 26 19 22   GLUCOSE 127* 127* 69* 102*  BUN 14 23 12 6   CREATININE 0.69 0.86 0.62 0.48*  CALCIUM 10.0 10.3 8.6 9.1   Liver Function Tests:  Recent Labs Lab 05/18/14 1113 05/23/14 1208 05/24/14 0601 05/25/14 0627  AST 22 20 18 27   ALT 15 16 15 23   ALKPHOS 48 54 38* 39  BILITOT 0.7 0.7 0.9 1.0  PROT 7.4 7.7 5.4* 5.8*  ALBUMIN 3.8 4.1 2.9* 3.0*    Recent Labs Lab 05/18/14 1113 05/23/14 1208  LIPASE 38 77*   No results for input(s): AMMONIA in the last 168 hours. CBC:  Recent Labs Lab 05/18/14 1113 05/23/14 1208 05/24/14 0601 05/25/14 0627  WBC 7.4 5.8 5.3 4.6  NEUTROABS 5.7 3.1  --   --   HGB 12.2 13.1 9.2* 9.4*  HCT 36.7 38.7 28.0* 27.9*  MCV 85.9 86.4 88.9 86.1  PLT 205 230 169 152   Cardiac Enzymes: No results for input(s):  CKTOTAL, CKMB, CKMBINDEX, TROPONINI in the last 168 hours. BNP (last 3 results) No results for input(s): BNP in the last 8760 hours.  ProBNP (last 3 results) No results for input(s): PROBNP in the last 8760 hours.  CBG: No results for input(s): GLUCAP in the last 168 hours.  No results found for this or any previous visit (from the past 240 hour(s)).   Studies: Ct Abdomen Pelvis Wo Contrast  05/23/2014   CLINICAL DATA:  49 year old female abdominal pain and vomiting. Endoscopy yesterday. Initial encounter.  EXAM: CT ABDOMEN AND PELVIS WITHOUT  CONTRAST  TECHNIQUE: Multidetector CT imaging of the abdomen and pelvis was performed following the standard protocol without IV contrast.  COMPARISON:  CT Abdomen and Pelvis 05/11/2014 and earlier  FINDINGS: minor lung base atelectasis. No pericardial or pleural effusion. No acute osseous abnormality identified.  Diminutive bladder. No pelvic free fluid. Uterus surgically absent. Stable right ovary.  No large bowel inflammation identified. Oral contrast has reached the terminal ileum. Appendix not identified today. No dilated small bowel. Negative stomach. No definite duodenum inflammation.  Noncontrast liver, gallbladder, spleen pancreas, adrenal glands, and kidneys appear stable and within normal limits. No abdominal free fluid or free air.  IMPRESSION: No definite acute or inflammatory process identified in the abdomen or pelvis in the absence of IV contrast.   Electronically Signed   By: Genevie Ann M.D.   On: 05/23/2014 17:13   US Abdomen Limited  05/23/2014   CLINICAL DATA:  49 year old female with subacute right upper quadrant pain, vomiting and weight loss. Initial encounter.  EXAM: US ABDOMEN LIMITED - RIGHT UPPER QUADRANT  COMPARISON:  05/11/2014 and prior CTs.  FINDINGS: Gallbladder:  The gallbladder is unremarkable. There is no evidence of cholelithiasis or acute cholecystitis.  Common bile duct:  Diameter: 3.0 mm. There is no evidence of intrahepatic or extrahepatic biliary dilatation.  Liver:  No focal lesion identified. Within normal limits in parenchymal echogenicity.  There is no evidence of ascites within the right upper abdomen.  IMPRESSION: Unremarkable right upper quadrant abdominal ultrasound.   Electronically Signed   By: Margarette Canada M.D.   On: 05/23/2014 13:21   Dg Chest Port 1 View  05/24/2014   CLINICAL DATA:  Feeling very weak para nausea and vomiting, diarrhea for 3 weeks.  EXAM: PORTABLE CHEST - 1 VIEW  COMPARISON:  05/11/2014  FINDINGS: Heart size is normal. Lungs are clear. No  pulmonary edema. Visualized osseous structures have a normal appearance.  IMPRESSION: No active disease.   Electronically Signed   By: Nolon Nations M.D.   On: 05/24/2014 15:08    Scheduled Meds: . ciprofloxacin  400 mg Intravenous Q12H  . enoxaparin (LOVENOX) injection  40 mg Subcutaneous Q24H  . metronidazole  500 mg Intravenous Q8H  . pantoprazole (PROTONIX) IV  40 mg Intravenous Q12H  . potassium chloride  40 mEq Oral Once   Continuous Infusions: . sodium chloride 125 mL/hr at 05/25/14 0448    Principal Problem:   Pancreatitis Active Problems:   Nausea & vomiting   Essential hypertension   Rapid weight loss   Tachycardia   Dehydration   GERD (gastroesophageal reflux disease)    Time spent: [facility] Pager [phone]. If 7PM-7AM, please contact night-coverage at [email or web address], password [facility] 05/25/2014, 9:06 AM  LOS: 1 day

## 2014-05-25 NOTE — Consult Note (Addendum)
Referring Provider: No ref. provider found Primary Care Physician:  No primary care provider on file. Primary Gastroenterologist:  Barney Drain  Reason for Consultation:  VOMITING   Impression: ADMITTED WITH 3-4 WEEK HISTORY OF VOMITING AND ABDOMINAL PAIN. SX INITIALLY OFF AND ON AND NOW PERSISTENT. PT REPORTS 17 LBS WEIGHT LOSS. LAST EGD WITHIN LAST 3 WEEKS WITH [facility]. DIFFERENTIAL DIAGNOSIS FOR INTRACTABLE VOMITING INCLUDES GASTROPARESIS, PYLORIC STENOSIS, H PYLORI GASTRITIS, LESS LIKELY ADRENAL INSUFFICIENCY, EOSINOPHILIC GASTROENTERITIS.  Plan: 1. CLEAR LIQUIDS THEN NPO AFTER MN. CHECK CORTISOL. 2. ATC ZOFRAN 3. I PERSONALLY REVIEWED THE CT(2) WITH DR.J WATTS. OK TO D/C ABX. 4. BID PPI 5. EGD WITH BIOPSY OF GASTRIC/DUODENAL MUCOSA AND BALLOON DILATION OF THE PYLORUS. CONSIDER GES. 6. OBTAIN RECORDS FROM [facility].    HPI:  PT HAD SUDDEN ONSET OF ABDOMINAL PAIN, VOMITING, AND DIARRHEA 3 WEEKS AGO. DIARRHEA LASTED A WEEK. HAS BEEN SEEN IN ED AND BI [facility]. SX TEMPORARILY IMPROVED FOR 1-2 DAYS AT FIRST AND NOW PT HAS PERSISTENT VOMITING FOR PAST 1-2 WEEKS. FEVER/CHILLS-OFF AND ON. FEELS PAIN/PRESSURE IN EPIGASTRIUM AND CHEST. HAS NO DIFFICULTY GETTING OUT OF CHAIR, WALKING. HAS PAIN IN RIGHT EAR BUT NO RINGING. NO CHANGE IN VISION, SORES IN MOUTH, RASH ON LEGS, JOINT PAIN, OR CHANGE IN BACK PAIN. USED IBUPROFEN FOR BACK PAIN. OTHERWISE, NO ASPIRIN, BC/GOODY POWDERS, OR NAPROXEN/ALEVE. NO ETOH, NEW MEDS (Rx OR OTC).  PT DENIES HEMATOCHEZIA, HEMATEMESIS, melena, SHORTNESS OF BREATH,  constipation, problems swallowing, problems with sedation, OR heartburn or indigestion.   Past Medical History  Diagnosis Date  . Hypertension   . Asthma     Past Surgical History  Procedure Laterality Date  . Abdominal hysterectomy    . Ovarian cyst surgery      Prior to Admission medications   Medication Sig Start Date End Date Taking? Authorizing Provider  ferrous sulfate 325 (65 FE) MG  tablet Take 325 mg by mouth every other day.   Yes Historical Provider, MD  hydrochlorothiazide (HYDRODIURIL) 25 MG tablet Take 25 mg by mouth daily. 04/28/14  Yes Historical Provider, MD  hyoscyamine (LEVSIN, ANASPAZ) 0.125 MG tablet Take 1 tablet by mouth every 4 (four) hours as needed for cramping.  05/04/14  Yes Historical Provider, MD  Ibuprofen-Diphenhydramine Cit (IBUPROFEN PM PO) Take 1 tablet by mouth at bedtime as needed (pain/sleep).    Yes Historical Provider, MD  Multiple Vitamin (MULITIVITAMIN WITH MINERALS) TABS Take 1 tablet by mouth daily.   Yes Historical Provider, MD  naproxen sodium (ANAPROX) 220 MG tablet Take 220 mg by mouth 2 (two) times daily as needed (pain).    Yes Historical Provider, MD  ondansetron (ZOFRAN ODT) 4 MG disintegrating tablet Take 1 tablet (4 mg total) by mouth every 8 (eight) hours as needed. 05/11/14  Yes Fredia Sorrow, MD  promethazine (PHENERGAN) 25 MG tablet Take 1 tablet (25 mg total) by mouth every 6 (six) hours as needed. Patient taking differently: Take 25 mg by mouth every 4 (four) hours as needed for nausea or vomiting.  05/18/14  Yes Fredia Sorrow, MD  ranitidine (ZANTAC) 150 MG tablet Take 150 mg by mouth 2 (two) times daily.   Yes Historical Provider, MD    Current Facility-Administered Medications  Medication Dose Route Frequency Provider Last Rate Last Dose  . 0.9 %  sodium chloride infusion   Intravenous Continuous Radene Gunning, NP 100 mL/hr at 05/25/14 5102    . acetaminophen (TYLENOL) tablet 650 mg  650 mg Oral Q6H  PRN Oswald Hillock, MD   650 mg at 05/24/14 0740  . ciprofloxacin (CIPRO) IVPB 400 mg  400 mg Intravenous Q12H Kathie Dike, MD   400 mg at 05/25/14 0044  . enoxaparin (LOVENOX) injection 40 mg  40 mg Subcutaneous Q24H Waldemar Dickens, MD   40 mg at 05/24/14 1830  . hydrALAZINE (APRESOLINE) injection 5 mg  5 mg Intravenous Q4H PRN Waldemar Dickens, MD      . HYDROmorphone (DILAUDID) injection 1 mg  1 mg Intravenous Q4H PRN Ashley Murrain, NP      . metroNIDAZOLE (FLAGYL) IVPB 500 mg  500 mg Intravenous Q8H Kathie Dike, MD   500 mg at 05/25/14 0448  . morphine 2 MG/ML injection 2 mg  2 mg Intravenous Q3H PRN Radene Gunning, NP      . ondansetron [facility]) injection 4 mg  4 mg Intravenous Q6H PRN Ashley Murrain, NP      . ondansetron [facility]) tablet 4 mg  4 mg Oral Q4H PRN Radene Gunning, NP       Or  . ondansetron [facility]) injection 4 mg  4 mg Intravenous Q4H PRN Radene Gunning, NP      . pantoprazole (PROTONIX) injection 40 mg  40 mg Intravenous Q12H Waldemar Dickens, MD   40 mg at 05/24/14 2224  . promethazine (PHENERGAN) injection 6.25 mg  6.25 mg Intravenous Q6H PRN Radene Gunning, NP        Allergies as of 05/23/2014 - Review Complete 05/23/2014  Allergen Reaction Noted  . Contrast media [iodinated diagnostic agents]  03/31/2014  . Iohexol Nausea And Vomiting 04/30/2011  . Vicodin [hydrocodone-acetaminophen] Rash 03/31/2014    Family History  Problem Relation Age of Onset  . Diabetes Mother   . Diabetes Father   . Hypertension Mother   . Hypertension Father    History   Social History  . Marital Status: Married    Spouse Name: N/A  . Number of Children: N/A  . Years of Education: N/A   Occupational History  . Not on file.   Social History Main Topics  . Smoking status: Never Smoker   . Smokeless tobacco: Not on file  . Alcohol Use: No  . Drug Use: No  . Sexual Activity: Yes    Birth Control/ Protection: Surgical   Other Topics Concern  . Not on file   Social History Narrative   Review of Systems: PER HPI OTHERWISE ALL SYSTEMS ARE NEGATIVE.  Vitals: Blood pressure 141/81, pulse 96, temperature 99.7 F (37.6 C), temperature source Oral, resp. rate 20, height 5\' 4"  (1.626 m), weight 128 lb 8.5 oz (58.3 kg), SpO2 100 %.  Physical Exam: General:   Alert,  pleasant and cooperative in NAD Head:  Normocephalic and atraumatic. Eyes:  Sclera clear, no icterus.   Conjunctiva pink. Mouth:  No  lesions Neck:  Supple; no masses. Lungs:  Clear throughout to auscultation.   No wheezes. No acute distress. Heart:  Regular rate and rhythm; no murmurs. Abdomen:  Soft, MILD TO MODERATE TENDERNESS x4, NO REBOUND OR GUARDING, And nondistended. No masses noted. Normal bowel sounds, without guarding, and without rebound.   Msk:  Symmetrical without gross deformities. Normal posture. Extremities:  Without edema. Neurologic:  Alert and  oriented x4;  grossly normal neurologically. Cervical Nodes:  No significant cervical adenopathy. Psych:  Alert and cooperative. Normal mood and affect.  Lab Results:  Recent Labs  05/23/14 1208 05/24/14 0601 05/25/14  0627  WBC 5.8 5.3 4.6  HGB 13.1 9.2* 9.4*  HCT 38.7 28.0* 27.9*  PLT 230 169 152   LIPASE 70   Recent Labs  05/24/14 0601 05/25/14 0627  NA 138 139  K 3.8 3.2*  CL 114* 109  CO2 19 22  GLUCOSE 69* 102*  BUN 12 6  CREATININE 0.62 0.48*  CALCIUM 8.6 9.1   LFT  Recent Labs  05/25/14 0627  PROT 5.8*  ALBUMIN 3.0*  AST 27  ALT 23  ALKPHOS 39  BILITOT 1.0   Studies/Results: CT ABD/PELVIS W/O IV CONTRAST: I PERSONALLY REVIEWED THE CT WITH DR. Wille Glaser WATTS. NO ACUTE INTRAABDOMINAL PATHOLOGY. FINDING OF GASTRITIS/DUODENITIS NOW RESOLVED.    LOS: 1 day   [facility]  05/25/2014, 9:49 AM

## 2014-05-26 ENCOUNTER — Encounter (HOSPITAL_COMMUNITY)
Admission: EM | Disposition: A | Discharge status: Home / Self Care | Payer: Self-pay | Source: Home / Self Care | Attending: Internal Medicine

## 2014-05-26 ENCOUNTER — Encounter (HOSPITAL_COMMUNITY): Payer: Self-pay | Admitting: *Deleted

## 2014-05-26 DIAGNOSIS — N390 Urinary tract infection, site not specified: Secondary | ICD-10-CM

## 2014-05-26 DIAGNOSIS — R109 Unspecified abdominal pain: Secondary | ICD-10-CM | POA: Diagnosis present

## 2014-05-26 DIAGNOSIS — K2970 Gastritis, unspecified, without bleeding: Secondary | ICD-10-CM

## 2014-05-26 HISTORY — PX: BALLOON DILATION: SHX5330

## 2014-05-26 HISTORY — PX: ESOPHAGOGASTRODUODENOSCOPY: SHX5428

## 2014-05-26 LAB — URINE CULTURE: Colony Count: >=100000

## 2014-05-26 LAB — CBC
HCT: 30.3 % — ABNORMAL LOW (ref 36.0–46.0)
Hemoglobin: 10.4 g/dL — ABNORMAL LOW (ref 12.0–15.0)
MCH: 29.4 pg (ref 26.0–34.0)
MCHC: 34.3 g/dL (ref 30.0–36.0)
MCV: 85.6 fL (ref 78.0–100.0)
PLATELETS: 161 10*3/uL (ref 150–400)
RBC: 3.54 MIL/uL — AB (ref 3.87–5.11)
RDW: 11.1 % — ABNORMAL LOW (ref 11.5–15.5)
WBC: 7.3 10*3/uL (ref 4.0–10.5)

## 2014-05-26 LAB — BASIC METABOLIC PANEL
Anion gap: 12 (ref 5–15)
BUN: 9 mg/dL (ref 6–23)
CALCIUM: 9.4 mg/dL (ref 8.4–10.5)
CO2: 19 mmol/L (ref 19–32)
Chloride: 109 mmol/L (ref 96–112)
Creatinine, Ser: 0.58 mg/dL (ref 0.50–1.10)
GFR calc Af Amer: >90 mL/min (ref >90)
GFR calc non Af Amer: >90 mL/min (ref >90)
Glucose, Bld: 70 mg/dL (ref 70–99)
Potassium: 3.8 mmol/L (ref 3.5–5.1)
Sodium: 140 mmol/L (ref 135–145)

## 2014-05-26 SURGERY — EGD (ESOPHAGOGASTRODUODENOSCOPY)
Anesthesia: Moderate Sedation

## 2014-05-26 MED ORDER — MEPERIDINE HCL 100 MG/ML IJ SOLN
INTRAMUSCULAR | Status: AC
  Filled 2014-05-26: qty 2

## 2014-05-26 MED ORDER — MIDAZOLAM HCL 5 MG/5ML IJ SOLN
INTRAMUSCULAR | Status: AC
  Filled 2014-05-26: qty 10

## 2014-05-26 MED ORDER — LIDOCAINE VISCOUS 2 % MT SOLN
OROMUCOSAL | Status: DC | PRN
  Administered 2014-05-26: 3 mL via OROMUCOSAL

## 2014-05-26 MED ORDER — SODIUM CHLORIDE 0.9 % IJ SOLN
INTRAMUSCULAR | Status: AC
  Filled 2014-05-26: qty 6

## 2014-05-26 MED ORDER — CEFTRIAXONE SODIUM IN DEXTROSE 20 MG/ML IV SOLN
1.0000 g | INTRAVENOUS | Status: DC
  Administered 2014-05-26 – 2014-05-27 (×2): 1 g via INTRAVENOUS
  Filled 2014-05-26 (×3): qty 50

## 2014-05-26 MED ORDER — MEPERIDINE HCL 100 MG/ML IJ SOLN
INTRAMUSCULAR | Status: DC | PRN
  Administered 2014-05-26: 25 mg via INTRAVENOUS
  Administered 2014-05-26: 50 mg via INTRAVENOUS

## 2014-05-26 MED ORDER — HYDROMORPHONE HCL 1 MG/ML IJ SOLN: 0.5000 mg | INTRAMUSCULAR | Status: DC | PRN

## 2014-05-26 MED ORDER — MIDAZOLAM HCL 5 MG/5ML IJ SOLN
INTRAMUSCULAR | Status: DC | PRN
  Administered 2014-05-26: 1 mg via INTRAVENOUS
  Administered 2014-05-26 (×2): 2 mg via INTRAVENOUS

## 2014-05-26 MED ORDER — MORPHINE SULFATE 2 MG/ML IJ SOLN: 1.0000 mg | INTRAMUSCULAR | Status: DC | PRN

## 2014-05-26 MED ORDER — PROMETHAZINE HCL 25 MG/ML IJ SOLN
INTRAMUSCULAR | Status: AC
  Filled 2014-05-26: qty 1

## 2014-05-26 MED ORDER — LIDOCAINE VISCOUS 2 % MT SOLN
OROMUCOSAL | Status: AC
  Filled 2014-05-26: qty 15

## 2014-05-26 MED ORDER — SODIUM CHLORIDE 0.9 % IV SOLN
INTRAVENOUS | Status: AC
  Administered 2014-05-26: 13:00:00 via INTRAVENOUS

## 2014-05-26 MED ORDER — STERILE WATER FOR IRRIGATION IR SOLN
Status: DC | PRN
  Administered 2014-05-26: 11:00:00

## 2014-05-26 MED ORDER — PROMETHAZINE HCL 25 MG/ML IJ SOLN
INTRAMUSCULAR | Status: DC | PRN
  Administered 2014-05-26: 12.5 mg via INTRAVENOUS

## 2014-05-26 NOTE — Interval H&P Note (Signed)
History and Physical Interval Note:  05/26/2014 11:21 AM  [name]  has presented today for surgery, with the diagnosis of INTRACTABLE NAUSEA/VOMITING, WEIGHT LOSS  The various methods of treatment have been discussed with the patient and family. After consideration of risks, benefits and other options for treatment, the patient has consented to  Procedure(s): ESOPHAGOGASTRODUODENOSCOPY (EGD) (N/A) [place] (N/A) as a surgical intervention .  The patient's history has been reviewed, patient examined, no change in status, stable for surgery.  I have reviewed the patient's chart and labs.  Questions were answered to the patient's satisfaction.     [employer]

## 2014-05-26 NOTE — Op Note (Signed)
[facility] [address] [place], [address]   ENDOSCOPY PROCEDURE REPORT  PATIENT: [name], [name]  MR#: [id number] BIRTHDATE: 06-Jul-1965 , 52  yrs. old GENDER: female  ENDOSCOPIST: Danie Binder, MD REFERRED BY: PROCEDURE DATE: 2014/06/08 PROCEDURE:   EGD w/ biopsy and EGD w/ balloon dilation  INDICATIONS:vomiting.   epigastric pain. MEDICATIONS: Promethazine (Phenergan) 12.5 mg IV, Demerol 75 mg IV, and Versed 5 mg IV TOPICAL ANESTHETIC:   Viscous Xylocaine ASA CLASS:  DESCRIPTION OF PROCEDURE:     Physical exam was performed.  Informed consent was obtained from the patient after explaining the benefits, risks, and alternatives to the procedure.  The patient was connected to the monitor and placed in the left lateral position.  Continuous oxygen was provided by nasal cannula and IV medicine administered through an indwelling cannula.  After administration of sedation, the patients esophagus was intubated and the EG-[id number] ([id number])  endoscope was advanced under direct visualization to the second portion of the duodenum.  The scope was removed slowly by carefully examining the color, texture, anatomy, and integrity of the mucosa on the way out.  The patient was recovered in endoscopy and discharged home in satisfactory condition.   ESOPHAGUS: The mucosa of the esophagus appeared normal.   STOMACH: Mild non-erosive gastritis (inflammation) was found in the gastric antrum.  Multiple biopsies were performed using cold forceps.  MET RESISTANCE WHEN PASSING GASTROSCOPE THRU PYLORUS. BALLOON DILTION OF PYLORUS PERFORMED 13.5-15 MM. ACH SETTING HELD FOR 1 MINUTE. DUODENUM: FOLDS APPEARED to be atrophied.  Cold forceps BIOPSIES OBTAINED IN BULB AND 2ND PORTION OF DUODENUM. COMPLICATIONS: There were no immediate complications.  ENDOSCOPIC IMPRESSION: 1.   NO OBVIOUS SOURCE FOR INTRACTABLE NAUSEA/VOMITING IDENTIFIED 2.   MILD Non-erosive  gastritis/DUODENITIS  RECOMMENDATIONS: BID PPI AWAIT BIOPSY & CORTISOL RESULTS CLEAR LIQUIDS ZOFRAN ATC CONSIDER GES IF PT NOT IMPROVED BY THUR.  REPEAT EXAM: eSigned:  Danie Binder, MD 06/08/14 1:27 PM     CPT CODES: ICD CODES:  The ICD and CPT codes recommended by this software are interpretations from the data that the clinical staff has captured with the software.  The verification of the translation of this report to the ICD and CPT codes and modifiers is the sole responsibility of the health care institution and practicing physician where this report was generated.  [employer]. will not be held responsible for the validity of the ICD and CPT codes included on this report.  AMA assumes no liability for data contained or not contained herein. CPT is a Designer, television/film set of the [employer].

## 2014-05-26 NOTE — H&P (View-Only) (Signed)
Referring Provider: No ref. provider found Primary Care Physician:  No primary care provider on file. Primary Gastroenterologist:  Barney Drain  Reason for Consultation:  VOMITING   Impression: ADMITTED WITH 3-4 WEEK HISTORY OF VOMITING AND ABDOMINAL PAIN. SX INITIALLY OFF AND ON AND NOW PERSISTENT. PT REPORTS 17 LBS WEIGHT LOSS. LAST EGD WITHIN LAST 3 WEEKS WITH [facility]. DIFFERENTIAL DIAGNOSIS FOR INTRACTABLE VOMITING INCLUDES GASTROPARESIS, PYLORIC STENOSIS, H PYLORI GASTRITIS, LESS LIKELY ADRENAL INSUFFICIENCY, EOSINOPHILIC GASTROENTERITIS.  Plan: 1. CLEAR LIQUIDS THEN NPO AFTER MN. CHECK CORTISOL. 2. ATC ZOFRAN 3. I PERSONALLY REVIEWED THE CT(2) WITH DR.J WATTS. OK TO D/C ABX. 4. BID PPI 5. EGD WITH BIOPSY OF GASTRIC/DUODENAL MUCOSA AND BALLOON DILATION OF THE PYLORUS. CONSIDER GES. 6. OBTAIN RECORDS FROM [facility].    HPI:  PT HAD SUDDEN ONSET OF ABDOMINAL PAIN, VOMITING, AND DIARRHEA 3 WEEKS AGO. DIARRHEA LASTED A WEEK. HAS BEEN SEEN IN ED AND BI [facility]. SX TEMPORARILY IMPROVED FOR 1-2 DAYS AT FIRST AND NOW PT HAS PERSISTENT VOMITING FOR PAST 1-2 WEEKS. FEVER/CHILLS-OFF AND ON. FEELS PAIN/PRESSURE IN EPIGASTRIUM AND CHEST. HAS NO DIFFICULTY GETTING OUT OF CHAIR, WALKING. HAS PAIN IN RIGHT EAR BUT NO RINGING. NO CHANGE IN VISION, SORES IN MOUTH, RASH ON LEGS, JOINT PAIN, OR CHANGE IN BACK PAIN. USED IBUPROFEN FOR BACK PAIN. OTHERWISE, NO ASPIRIN, BC/GOODY POWDERS, OR NAPROXEN/ALEVE. NO ETOH, NEW MEDS (Rx OR OTC).  PT DENIES HEMATOCHEZIA, HEMATEMESIS, melena, SHORTNESS OF BREATH,  constipation, problems swallowing, problems with sedation, OR heartburn or indigestion.   Past Medical History  Diagnosis Date  . Hypertension   . Asthma     Past Surgical History  Procedure Laterality Date  . Abdominal hysterectomy    . Ovarian cyst surgery      Prior to Admission medications   Medication Sig Start Date End Date Taking? Authorizing Provider  ferrous sulfate 325 (65 FE) MG  tablet Take 325 mg by mouth every other day.   Yes Historical Provider, MD  hydrochlorothiazide (HYDRODIURIL) 25 MG tablet Take 25 mg by mouth daily. 04/28/14  Yes Historical Provider, MD  hyoscyamine (LEVSIN, ANASPAZ) 0.125 MG tablet Take 1 tablet by mouth every 4 (four) hours as needed for cramping.  05/04/14  Yes Historical Provider, MD  Ibuprofen-Diphenhydramine Cit (IBUPROFEN PM PO) Take 1 tablet by mouth at bedtime as needed (pain/sleep).    Yes Historical Provider, MD  Multiple Vitamin (MULITIVITAMIN WITH MINERALS) TABS Take 1 tablet by mouth daily.   Yes Historical Provider, MD  naproxen sodium (ANAPROX) 220 MG tablet Take 220 mg by mouth 2 (two) times daily as needed (pain).    Yes Historical Provider, MD  ondansetron (ZOFRAN ODT) 4 MG disintegrating tablet Take 1 tablet (4 mg total) by mouth every 8 (eight) hours as needed. 05/11/14  Yes Fredia Sorrow, MD  promethazine (PHENERGAN) 25 MG tablet Take 1 tablet (25 mg total) by mouth every 6 (six) hours as needed. Patient taking differently: Take 25 mg by mouth every 4 (four) hours as needed for nausea or vomiting.  05/18/14  Yes Fredia Sorrow, MD  ranitidine (ZANTAC) 150 MG tablet Take 150 mg by mouth 2 (two) times daily.   Yes Historical Provider, MD    Current Facility-Administered Medications  Medication Dose Route Frequency Provider Last Rate Last Dose  . 0.9 %  sodium chloride infusion   Intravenous Continuous Radene Gunning, NP 100 mL/hr at 05/25/14 6144    . acetaminophen (TYLENOL) tablet 650 mg  650 mg Oral Q6H  PRN Oswald Hillock, MD   650 mg at 05/24/14 0740  . ciprofloxacin (CIPRO) IVPB 400 mg  400 mg Intravenous Q12H Kathie Dike, MD   400 mg at 05/25/14 0044  . enoxaparin (LOVENOX) injection 40 mg  40 mg Subcutaneous Q24H Waldemar Dickens, MD   40 mg at 05/24/14 1830  . hydrALAZINE (APRESOLINE) injection 5 mg  5 mg Intravenous Q4H PRN Waldemar Dickens, MD      . HYDROmorphone (DILAUDID) injection 1 mg  1 mg Intravenous Q4H PRN Ashley Murrain, NP      . metroNIDAZOLE (FLAGYL) IVPB 500 mg  500 mg Intravenous Q8H Kathie Dike, MD   500 mg at 05/25/14 0448  . morphine 2 MG/ML injection 2 mg  2 mg Intravenous Q3H PRN Radene Gunning, NP      . ondansetron [facility]) injection 4 mg  4 mg Intravenous Q6H PRN Ashley Murrain, NP      . ondansetron [facility]) tablet 4 mg  4 mg Oral Q4H PRN Radene Gunning, NP       Or  . ondansetron [facility]) injection 4 mg  4 mg Intravenous Q4H PRN Radene Gunning, NP      . pantoprazole (PROTONIX) injection 40 mg  40 mg Intravenous Q12H Waldemar Dickens, MD   40 mg at 05/24/14 2224  . promethazine (PHENERGAN) injection 6.25 mg  6.25 mg Intravenous Q6H PRN Radene Gunning, NP        Allergies as of 05/23/2014 - Review Complete 05/23/2014  Allergen Reaction Noted  . Contrast media [iodinated diagnostic agents]  03/31/2014  . Iohexol Nausea And Vomiting 04/30/2011  . Vicodin [hydrocodone-acetaminophen] Rash 03/31/2014    Family History  Problem Relation Age of Onset  . Diabetes Mother   . Diabetes Father   . Hypertension Mother   . Hypertension Father    History   Social History  . Marital Status: Married    Spouse Name: N/A  . Number of Children: N/A  . Years of Education: N/A   Occupational History  . Not on file.   Social History Main Topics  . Smoking status: Never Smoker   . Smokeless tobacco: Not on file  . Alcohol Use: No  . Drug Use: No  . Sexual Activity: Yes    Birth Control/ Protection: Surgical   Other Topics Concern  . Not on file   Social History Narrative   Review of Systems: PER HPI OTHERWISE ALL SYSTEMS ARE NEGATIVE.  Vitals: Blood pressure 141/81, pulse 96, temperature 99.7 F (37.6 C), temperature source Oral, resp. rate 20, height 5\' 4"  (1.626 m), weight 128 lb 8.5 oz (58.3 kg), SpO2 100 %.  Physical Exam: General:   Alert,  pleasant and cooperative in NAD Head:  Normocephalic and atraumatic. Eyes:  Sclera clear, no icterus.   Conjunctiva pink. Mouth:  No  lesions Neck:  Supple; no masses. Lungs:  Clear throughout to auscultation.   No wheezes. No acute distress. Heart:  Regular rate and rhythm; no murmurs. Abdomen:  Soft, MILD TO MODERATE TENDERNESS x4, NO REBOUND OR GUARDING, And nondistended. No masses noted. Normal bowel sounds, without guarding, and without rebound.   Msk:  Symmetrical without gross deformities. Normal posture. Extremities:  Without edema. Neurologic:  Alert and  oriented x4;  grossly normal neurologically. Cervical Nodes:  No significant cervical adenopathy. Psych:  Alert and cooperative. Normal mood and affect.  Lab Results:  Recent Labs  05/23/14 1208 05/24/14 0601 05/25/14  0627  WBC 5.8 5.3 4.6  HGB 13.1 9.2* 9.4*  HCT 38.7 28.0* 27.9*  PLT 230 169 152   LIPASE 70   Recent Labs  05/24/14 0601 05/25/14 0627  NA 138 139  K 3.8 3.2*  CL 114* 109  CO2 19 22  GLUCOSE 69* 102*  BUN 12 6  CREATININE 0.62 0.48*  CALCIUM 8.6 9.1   LFT  Recent Labs  05/25/14 0627  PROT 5.8*  ALBUMIN 3.0*  AST 27  ALT 23  ALKPHOS 39  BILITOT 1.0   Studies/Results: CT ABD/PELVIS W/O IV CONTRAST: I PERSONALLY REVIEWED THE CT WITH DR. Wille Glaser WATTS. NO ACUTE INTRAABDOMINAL PATHOLOGY. FINDING OF GASTRITIS/DUODENITIS NOW RESOLVED.    LOS: 1 day   [facility]  05/25/2014, 9:49 AM

## 2014-05-26 NOTE — Care Management Note (Addendum)
    Page 1 of 1   05/28/2014     11:30:23 AM CARE MANAGEMENT NOTE 05/28/2014  Patient:  [name],[name]   Account Number:  [id number]  Date Initiated:  05/26/2014  Documentation initiated by:  Theophilus Kinds  Subjective/Objective Assessment:   Pt admitted from home with vomiting and dehydration. Pt lives with her husband and will return home at discharge.     Action/Plan:   Will continue to follow for discharge planning needs.   Anticipated DC Date:  05/27/2014   Anticipated DC Plan:  [place]  CM consult      Choice offered to / List presented to:             Status of service:  Completed, signed off Medicare Important Message given?   (If response is "NO", the following Medicare IM given date fields will be blank) Date Medicare IM given:   Medicare IM given by:   Date Additional Medicare IM given:   Additional Medicare IM given by:    Discharge Disposition:  HOME/SELF CARE  Per UR Regulation:    If discussed at Long Length of Stay Meetings, dates discussed:   05/28/2014    Comments:  05/28/14 [place], RN BSN CM Pt discharged home today. No CM needs noted.  05/27/14 [place], RN BSN CM Pt able to tolerate liquids today. Plans to advance diet and hopefully discharge within 24 hours. Pt denies any CM needs.  05/26/14 [place], RN BSN CM

## 2014-05-26 NOTE — Progress Notes (Signed)
ANTIBIOTIC CONSULT NOTE - INITIAL  Pharmacy Consult for Rocephin Indication: UTI  Allergies  Allergen Reactions  . Contrast Media [Iodinated Diagnostic Agents]   . Iohexol Nausea And Vomiting    Pt. Vomited immediately after injection of Omni 350.    . Vicodin [Hydrocodone-Acetaminophen] Rash   Patient Measurements: Height: 5\' 4"  (162.6 cm) Weight: 128 lb 8.5 oz (58.3 kg) IBW/kg (Calculated) : 54.7  Vital Signs: Temp: 98.3 F (36.8 C) (02/16 1019) Temp Source: Oral (02/16 1019) BP: 98/60 mmHg (02/16 1205) Pulse Rate: 112 (02/16 1205) Intake/Output from previous day: 02/15 0701 - 02/16 0700 In: 2613.3 [I.V.:2613.3] Out: 1200 [Urine:1200] Intake/Output from this shift:    Labs:  Recent Labs  05/24/14 0601 05/25/14 0627 05/26/14 0558  WBC 5.3 4.6 7.3  HGB 9.2* 9.4* 10.4*  PLT 169 152 161  CREATININE 0.62 0.48* 0.58   Estimated Creatinine Clearance: 75.1 mL/min (by C-G formula based on Cr of 0.58). No results for input(s): VANCOTROUGH, VANCOPEAK, VANCORANDOM, GENTTROUGH, GENTPEAK, GENTRANDOM, TOBRATROUGH, TOBRAPEAK, TOBRARND, AMIKACINPEAK, AMIKACINTROU, AMIKACIN in the last 72 hours.   Microbiology: Recent Results (from the past 720 hour(s))  Culture, Urine     Status: None   Collection Time: 05/23/14  5:54 PM  Result Value Ref Range Status   Specimen Description URINE, RANDOM  Final   Special Requests NONE  Final   Colony Count   Final    >=100,000 COLONIES/ML Performed at [facility]    Culture   Final    Multiple bacterial morphotypes present, none predominant. Suggest appropriate recollection if clinically indicated. Performed at [facility]    Report Status 05/26/2014 FINAL  Final   Medical History: Past Medical History  Diagnosis Date  . Hypertension   . Asthma    Anti-infectives    Start     Dose/Rate Route Frequency Ordered Stop   05/26/14 1500  cefTRIAXone (ROCEPHIN) 1 g in dextrose 5 % 50 mL IVPB - Premix     1 g 100  mL/hr over 30 Minutes Intravenous Every 24 hours 05/26/14 1247     05/24/14 1300  ciprofloxacin (CIPRO) IVPB 400 mg  Status:  Discontinued     400 mg 200 mL/hr over 60 Minutes Intravenous Every 12 hours 05/24/14 1246 05/25/14 1529   05/24/14 1300  metroNIDAZOLE (FLAGYL) IVPB 500 mg  Status:  Discontinued     500 mg 100 mL/hr over 60 Minutes Intravenous Every 8 hours 05/24/14 1246 05/25/14 1529     Assessment: 49yo female with intractable N/V.  Asked to initiate Rocephin for UTI.  Goal of Therapy:  Eradicate infection.  Plan:  Rocephin 1gm IV q24hrs Monitor labs, progress, and cultures Switch to PO ABX when /if appropriate  Hart Robinsons A 05/26/2014,12:48 PM

## 2014-05-26 NOTE — Progress Notes (Signed)
[facility] PROGRESS NOTE  [name] BWG:[id number] DOB: 01-26-1966 DOA: 05/23/2014 PCP: No primary care provider on file.  Assessment/Plan:  Abdominal pain. Continues to complain of abdominal pain. May possibly be related to an underlying gastroenteritis. UTI may also be playing a role. Max temp 100.5. Reports recurrent diarrhea. No leukocytosis. Await stool studies.  Continues with nausea and tolerating only small amounts clear liquid. scheduled zofran before meals. Continue cipro and flagyl disontinued 05/25/14. Evaluated by GI and s/p EGD with biopsy and balloon dilitation of pyloris.    UTI. Urine culture with >100,000 multiple bacterial morphotypes. . Max temp 100.5. Rocephin day #1.  Hypertension. Low end of normal s/p EGD.  Hydrochlorothiazide on hold. Continue to follow blood pressure.  Dehydration. Related to vomiting and diarrhea. Increase IV fluid rate. Monitor..  GERD. Appears stable at baseline given #1. Continue PPI. Monitor  Hypokalemia: mild. Resolved today.  Related to decreased po intake. Monitor  Tachycardia: likely related to #4 and volume. Will increase IV fluid rate. no Monitor closely  Code Status: full Family Communication: husband at bedside Disposition Plan: home when ready   Consultants:  GI dr fields  Procedures:  EGD 05/26/14  Antibiotics:  cipro 05/24/14>>05/25/14  Flagyl 05/24/14>>05/25/14  Rocephin 05/26/14>>  HPI/Subjective: Lethargic, follows commands  Objective: Filed Vitals:   05/26/14 1205  BP: 98/60  Pulse: 112  Temp:   Resp: 19    Intake/Output Summary (Last 24 hours) at 05/26/14 1309 Last data filed at 05/26/14 0604  Gross per 24 hour  Intake 2613.34 ml  Output   1200 ml  Net 1413.34 ml   Filed Weights   05/23/14 1107 05/23/14 1111 05/23/14 2005  Weight: 58.968 kg (130 lb) 54.885 kg (121 lb) 58.3 kg (128 lb 8.5 oz)    Exam:   General:  Somewhat uncomfortable appearing   Cardiovascular: tachycardic but  regular no m/g/r no LE edema  Respiratory: normal effort BS slightly shallow good air flow BS clear  Abdomen: flat soft sluggish BS mild tenderness diffusely  Musculoskeletal: joints without swelling/erythema   Data Reviewed: Basic Metabolic Panel:  Recent Labs Lab 05/23/14 1208 05/24/14 0601 05/25/14 0627 05/26/14 0558  NA 137 138 139 140  K 3.5 3.8 3.2* 3.8  CL 104 114* 109 109  CO2 26 19 22 19   GLUCOSE 127* 69* 102* 70  BUN 23 12 6 9   CREATININE 0.86 0.62 0.48* 0.58  CALCIUM 10.3 8.6 9.1 9.4   Liver Function Tests:  Recent Labs Lab 05/23/14 1208 05/24/14 0601 05/25/14 0627  AST 20 18 27   ALT 16 15 23   ALKPHOS 54 38* 39  BILITOT 0.7 0.9 1.0  PROT 7.7 5.4* 5.8*  ALBUMIN 4.1 2.9* 3.0*    Recent Labs Lab 05/23/14 1208  LIPASE 77*   No results for input(s): AMMONIA in the last 168 hours. CBC:  Recent Labs Lab 05/23/14 1208 05/24/14 0601 05/25/14 0627 05/26/14 0558  WBC 5.8 5.3 4.6 7.3  NEUTROABS 3.1  --   --   --   HGB 13.1 9.2* 9.4* 10.4*  HCT 38.7 28.0* 27.9* 30.3*  MCV 86.4 88.9 86.1 85.6  PLT 230 169 152 161   Cardiac Enzymes: No results for input(s): CKTOTAL, CKMB, CKMBINDEX, TROPONINI in the last 168 hours. BNP (last 3 results) No results for input(s): BNP in the last 8760 hours.  ProBNP (last 3 results) No results for input(s): PROBNP in the last 8760 hours.  CBG: No results for input(s): GLUCAP in the last 168 hours.  Recent Results (from the past 240 hour(s))  Culture, Urine     Status: None   Collection Time: 05/23/14  5:54 PM  Result Value Ref Range Status   Specimen Description URINE, RANDOM  Final   Special Requests NONE  Final   Colony Count   Final    >=100,000 COLONIES/ML Performed at [facility]    Culture   Final    Multiple bacterial morphotypes present, none predominant. Suggest appropriate recollection if clinically indicated. Performed at [facility]    Report Status 05/26/2014 FINAL  Final      Studies: Dg Chest Port 1 View  05/24/2014   CLINICAL DATA:  Feeling very weak para nausea and vomiting, diarrhea for 3 weeks.  EXAM: PORTABLE CHEST - 1 VIEW  COMPARISON:  05/11/2014  FINDINGS: Heart size is normal. Lungs are clear. No pulmonary edema. Visualized osseous structures have a normal appearance.  IMPRESSION: No active disease.   Electronically Signed   By: Nolon Nations M.D.   On: 05/24/2014 15:08    Scheduled Meds: . cefTRIAXone (ROCEPHIN)  IV  1 g Intravenous Q24H  . enoxaparin (LOVENOX) injection  40 mg Subcutaneous Q24H  . feeding supplement (RESOURCE BREEZE)  1 Container Oral TID BM  . lidocaine      . meperidine      . midazolam      . ondansetron  4 mg Intravenous TID PC & HS  . pantoprazole (PROTONIX) IV  40 mg Intravenous BID AC  . promethazine      . sodium chloride       Continuous Infusions: . sodium chloride      Principal Problem:   Pancreatitis Active Problems:   Nausea & vomiting   Essential hypertension   Rapid weight loss   Tachycardia   Dehydration   GERD (gastroesophageal reflux disease)   Fever   Nausea with vomiting   UTI (urinary tract infection)    Time spent: 35 minutes    [facility] Pager [phone]. If 7PM-7AM, please contact night-coverage at [email or web address], password [facility] 05/26/2014, 1:09 PM  LOS: 2 days

## 2014-05-27 ENCOUNTER — Encounter (HOSPITAL_COMMUNITY): Payer: Self-pay | Admitting: Gastroenterology

## 2014-05-27 DIAGNOSIS — R7989 Other specified abnormal findings of blood chemistry: Secondary | ICD-10-CM

## 2014-05-27 DIAGNOSIS — R1030 Lower abdominal pain, unspecified: Secondary | ICD-10-CM

## 2014-05-27 LAB — BASIC METABOLIC PANEL
Anion gap: 8 (ref 5–15)
BUN: 7 mg/dL (ref 6–23)
CALCIUM: 9.3 mg/dL (ref 8.4–10.5)
CO2: 19 mmol/L (ref 19–32)
CREATININE: 0.49 mg/dL — AB (ref 0.50–1.10)
Chloride: 112 mmol/L (ref 96–112)
GFR calc Af Amer: >90 mL/min (ref >90)
Glucose, Bld: 92 mg/dL (ref 70–99)
Potassium: 3.6 mmol/L (ref 3.5–5.1)
Sodium: 139 mmol/L (ref 135–145)

## 2014-05-27 LAB — CBC
HCT: 28.8 % — ABNORMAL LOW (ref 36.0–46.0)
Hemoglobin: 9.6 g/dL — ABNORMAL LOW (ref 12.0–15.0)
MCH: 28.5 pg (ref 26.0–34.0)
MCHC: 33.3 g/dL (ref 30.0–36.0)
MCV: 85.5 fL (ref 78.0–100.0)
PLATELETS: 158 10*3/uL (ref 150–400)
RBC: 3.37 MIL/uL — AB (ref 3.87–5.11)
RDW: 11.3 % — ABNORMAL LOW (ref 11.5–15.5)
WBC: 4.6 10*3/uL (ref 4.0–10.5)

## 2014-05-27 LAB — PTH, INTACT AND CALCIUM
Calcium, Total (PTH): 9.5 mg/dL (ref 8.7–10.2)
PTH: 6 pg/mL — ABNORMAL LOW (ref 15–65)

## 2014-05-27 LAB — CORTISOL-AM, BLOOD: CORTISOL - AM: 9.6 ug/dL (ref 4.3–22.4)

## 2014-05-27 LAB — T4: T4, Total: 15.1 ug/dL — ABNORMAL HIGH (ref 4.5–12.0)

## 2014-05-27 LAB — HIV ANTIBODY (ROUTINE TESTING W REFLEX): HIV Screen 4th Generation wRfx: NONREACTIVE

## 2014-05-27 LAB — CLOSTRIDIUM DIFFICILE BY PCR: Toxigenic C. Difficile by PCR: NEGATIVE

## 2014-05-27 MED ORDER — ONDANSETRON HCL 4 MG PO TABS
4.0000 mg | ORAL_TABLET | Freq: Three times a day (TID) | ORAL | Status: DC
  Administered 2014-05-27 (×2): 4 mg via ORAL
  Filled 2014-05-27: qty 1

## 2014-05-27 MED ORDER — PANTOPRAZOLE SODIUM 40 MG PO TBEC
40.0000 mg | DELAYED_RELEASE_TABLET | Freq: Two times a day (BID) | ORAL | Status: DC
  Administered 2014-05-27 – 2014-05-28 (×2): 40 mg via ORAL
  Filled 2014-05-27 (×2): qty 1

## 2014-05-27 NOTE — Progress Notes (Signed)
[facility] PROGRESS NOTE  [name] JJH:[id number] DOB: [birth year] DOA: 05/23/2014 PCP: No primary care provider on file.  Assessment/Plan:  Abdominal pain/ intractable nausea and vomiting. May be related to pyloric stenosis s/p dilation 05/26/14. Reports only small amount pain in LLQ. Max temp 99.4. Reports no BM yesterday.  No leukocytosis. Await stool studies. Nausea improved with scheduled zofran, po. No vomiting. Tolerating small amounts full liquid. EGD 05/26/14 withmild non-erosive inflammation gastric antrum s/p biopsy and dilation of pylorus, duodenum. GI following and recommend 24 hours observation on full liquids and po zofran. If worsens consider GES.   UTI. Urine culture with >100,000 multiple bacterial morphotypes.  Max temp 99.4  Rocephin day #2.non toxic appearing.   Hypertension. Controlled.  Hydrochlorothiazide on hold. Continue to follow blood pressure.  Dehydration. Improved.  Related to vomiting and diarrhea. IV access lost. Taking po fluids fairly well.  Monitor..  GERD. remains stable at baseline given #1. Continue PPI. Monitor  Hypokalemia: mild. Resolved. Related to decreased po intake. Monitor  Tachycardia: lmprove with IV fluids. Remains at high end of normal. May be related to low TSH. Asymptomatic  Abnormal TSH. TSH <0.010. T4 elevated at 15.1. Await free T3. No hx thyroid problems. 17lb weight loss attributed to #1. Mild tachycardia. Exam benign. May be related to acute illness. Will need    Code Status: full Family Communication: none present Disposition Plan: home hopefully tomorrow   Consultants:  Gi  Procedures:  EGD 06/05/14  Antibiotics:  cipro 05/24/14>>05/25/14  Flagyl 05/24/14>>05/25/14  Rocephin 05/26/14>>   HPI/Subjective: Sitting up in bed smiling. Reports feeling "much better". Reports only small amount abdominal pain LLQ no vomiting and little nausea. Eating full liquids slowly  Objective: Filed Vitals:   05/27/14  0406  BP: 143/84  Pulse: 93  Temp: 98.9 F (37.2 C)  Resp: 15    Intake/Output Summary (Last 24 hours) at 05/27/14 1331 Last data filed at 05/26/14 2158  Gross per 24 hour  Intake 1598.75 ml  Output    300 ml  Net 1298.75 ml   Filed Weights   05/23/14 1107 05/23/14 1111 05/23/14 2005  Weight: 58.968 kg (130 lb) 54.885 kg (121 lb) 58.3 kg (128 lb 8.5 oz)    Exam:   General: appears comfortable calm   Cardiovascular: tachycardic, regular no MGR no LE edema  Respiratory: normal effort BS clear bilaterally no wheeze  Abdomen: flat soft +BS mild tenderness LLQ no guarding  Musculoskeletal: joints without swelling/erythema   Data Reviewed: Basic Metabolic Panel:  Recent Labs Lab 05/23/14 1208 05/24/14 0601 05/25/14 0627 05/26/14 0558 05/27/14 0550  NA 137 138 139 140 139  K 3.5 3.8 3.2* 3.8 3.6  CL 104 114* 109 109 112  CO2 26 19 22 19 19   GLUCOSE 127* 69* 102* 70 92  BUN 23 12 6 9 7   CREATININE 0.86 0.62 0.48* 0.58 0.49*  CALCIUM 10.3 8.6 9.1 9.4 9.3   Liver Function Tests:  Recent Labs Lab 05/23/14 1208 05/24/14 0601 05/25/14 0627  AST 20 18 27   ALT 16 15 23   ALKPHOS 54 38* 39  BILITOT 0.7 0.9 1.0  PROT 7.7 5.4* 5.8*  ALBUMIN 4.1 2.9* 3.0*    Recent Labs Lab 05/23/14 1208  LIPASE 77*   No results for input(s): AMMONIA in the last 168 hours. CBC:  Recent Labs Lab 05/23/14 1208 05/24/14 0601 05/25/14 0627 05/26/14 0558 05/27/14 0550  WBC 5.8 5.3 4.6 7.3 4.6  NEUTROABS 3.1  --   --   --   --  HGB 13.1 9.2* 9.4* 10.4* 9.6*  HCT 38.7 28.0* 27.9* 30.3* 28.8*  MCV 86.4 88.9 86.1 85.6 85.5  PLT 230 169 152 161 158   Cardiac Enzymes: No results for input(s): CKTOTAL, CKMB, CKMBINDEX, TROPONINI in the last 168 hours. BNP (last 3 results) No results for input(s): BNP in the last 8760 hours.  ProBNP (last 3 results) No results for input(s): PROBNP in the last 8760 hours.  CBG: No results for input(s): GLUCAP in the last 168  hours.  Recent Results (from the past 240 hour(s))  Culture, Urine     Status: None   Collection Time: 05/23/14  5:54 PM  Result Value Ref Range Status   Specimen Description URINE, RANDOM  Final   Special Requests NONE  Final   Colony Count   Final    >=100,000 COLONIES/ML Performed at [facility]    Culture   Final    Multiple bacterial morphotypes present, none predominant. Suggest appropriate recollection if clinically indicated. Performed at [facility]    Report Status 05/26/2014 FINAL  Final  Culture, blood (routine x 2)     Status: None (Preliminary result)   Collection Time: 05/24/14  7:02 AM  Result Value Ref Range Status   Specimen Description BLOOD RIGHT ANTECUBITAL  Final   Special Requests BOTTLES DRAWN AEROBIC AND ANAEROBIC 6CC  Final   Culture NO GROWTH 3 DAYS  Final   Report Status PENDING  Incomplete  Culture, blood (routine x 2)     Status: None (Preliminary result)   Collection Time: 05/24/14  7:02 AM  Result Value Ref Range Status   Specimen Description BLOOD RIGHT HAND  Final   Special Requests BOTTLES DRAWN AEROBIC AND ANAEROBIC 6CC  Final   Culture NO GROWTH 3 DAYS  Final   Report Status PENDING  Incomplete     Studies: No results found.  Scheduled Meds: . cefTRIAXone (ROCEPHIN)  IV  1 g Intravenous Q24H  . enoxaparin (LOVENOX) injection  40 mg Subcutaneous Q24H  . feeding supplement (RESOURCE BREEZE)  1 Container Oral TID BM  . ondansetron  4 mg Oral TID AC & HS  . pantoprazole  40 mg Oral BID AC   Continuous Infusions:   Active Problems:   Nausea & vomiting   Essential hypertension   Rapid weight loss   Tachycardia   Dehydration   GERD (gastroesophageal reflux disease)   Fever   Nausea with vomiting   UTI (urinary tract infection)   Abdominal pain   Abnormal TSH    Time spent: 35 minutes    [facility] Pager [phone]. If 7PM-7AM, please contact night-coverage at [email or web address], password  [facility] 05/27/2014, 1:31 PM  LOS: 3 days

## 2014-05-27 NOTE — Progress Notes (Signed)
Patients Temp was 100.7, made MD aware, will administer tylenol and continue to monitor. Will follow any new orders received.

## 2014-05-27 NOTE — Progress Notes (Signed)
Subjective:  Patient feels better. Tolerating clear liquid diet. No BM in 48 hours. Nausea improved. Currently without IV access. Has been on scheduled Zofran IV until late last night.   Objective: Vital signs in last 24 hours: Temp:  [98.3 F (36.8 C)-99.4 F (37.4 C)] 98.9 F (37.2 C) (02/17 0406) Pulse Rate:  [89-127] 93 (02/17 0406) Resp:  [15-24] 15 (02/17 0406) BP: (98-143)/(52-98) 143/84 mmHg (02/17 0406) SpO2:  [98 %-100 %] 100 % (02/17 0406) Last BM Date: 05/25/14 General:   Alert,  Well-developed, well-nourished, pleasant and cooperative in NAD Head:  Normocephalic and atraumatic. Eyes:  Sclera clear, no icterus.  Abdomen:  Soft, mild lower tenderness and nondistended. Normal bowel sounds, without guarding, and without rebound.   Extremities:  Without clubbing, deformity or edema. Neurologic:  Alert and  oriented x4;  grossly normal neurologically. Skin:  Intact without significant lesions or rashes. Psych:  Alert and cooperative. Normal mood and affect.  Intake/Output from previous day: 02/16 0701 - 02/17 0700 In: 1598.8 [I.V.:1598.8] Out: 300 [Urine:300] Intake/Output this shift:    Lab Results: CBC  Recent Labs  05/25/14 0627 05/26/14 0558 05/27/14 0550  WBC 4.6 7.3 4.6  HGB 9.4* 10.4* 9.6*  HCT 27.9* 30.3* 28.8*  MCV 86.1 85.6 85.5  PLT 152 161 158   BMET  Recent Labs  05/25/14 0627 05/26/14 0558 05/27/14 0550  NA 139 140 139  K 3.2* 3.8 3.6  CL 109 109 112  CO2 22 19 19   GLUCOSE 102* 70 92  BUN 6 9 7   CREATININE 0.48* 0.58 0.49*  CALCIUM 9.1 9.4 9.3   LFTs  Recent Labs  05/25/14 0627  BILITOT 1.0  ALKPHOS 39  AST 27  ALT 23  PROT 5.8*  ALBUMIN 3.0*   Fasting am cortisol normal  Lab Results  Component Value Date   TSH <0.010* 05/23/2014     No results for input(s): LIPASE in the last 72 hours. PT/INR No results for input(s): LABPROT, INR in the last 72 hours.    Imaging Studies: Ct Abdomen Pelvis Wo  Contrast  05/23/2014   CLINICAL DATA:  49 year old female abdominal pain and vomiting. Endoscopy yesterday. Initial encounter.  EXAM: CT ABDOMEN AND PELVIS WITHOUT CONTRAST  TECHNIQUE: Multidetector CT imaging of the abdomen and pelvis was performed following the standard protocol without IV contrast.  COMPARISON:  CT Abdomen and Pelvis 05/11/2014 and earlier  FINDINGS: minor lung base atelectasis. No pericardial or pleural effusion. No acute osseous abnormality identified.  Diminutive bladder. No pelvic free fluid. Uterus surgically absent. Stable right ovary.  No large bowel inflammation identified. Oral contrast has reached the terminal ileum. Appendix not identified today. No dilated small bowel. Negative stomach. No definite duodenum inflammation.  Noncontrast liver, gallbladder, spleen pancreas, adrenal glands, and kidneys appear stable and within normal limits. No abdominal free fluid or free air.  IMPRESSION: No definite acute or inflammatory process identified in the abdomen or pelvis in the absence of IV contrast.   Electronically Signed   By: Genevie Ann M.D.   On: 05/23/2014 17:13   Ct Abdomen Pelvis Wo Contrast  05/11/2014   CLINICAL DATA:  Persistent abdominal pain for approximately 13 days with vomiting.  EXAM: CT ABDOMEN AND PELVIS WITHOUT CONTRAST  TECHNIQUE: Multidetector CT imaging of the abdomen and pelvis was performed following the standard protocol without IV contrast. (Patient has a IV contrast allergy)  COMPARISON:  07/23/2010  FINDINGS: Lower chest: Clear lung bases. Normal heart size. No pericardial or  pleural effusion.  Abdomen: The kidneys demonstrate no acute hydronephrosis, perinephric inflammation, or obstructing ureteral calculus on either side.  The liver, gallbladder, biliary system, pancreas, spleen, and adrenal glands are within normal limits for age and noncontrast imaging.  Slight wall prominence of the distal stomach and the duodenum, difficult to exclude mild gastritis/  duodenitis.  No associated bowel obstruction, dilatation, ileus, or free air.  Pelvis: Prior hysterectomy. No pelvic free fluid, fluid collection, hemorrhage, abscess, adenopathy, inguinal abnormality, or hernia. Urinary bladder unremarkable. Pelvic calcifications consistent with venous phleboliths. No acute distal bowel process. Portions of the appendix are demonstrated across the right iliac vessels, images 59 and 60 without evidence of appendicitis. Right lower quadrant lobulated soft tissue overlies the right iliopsoas muscle, image 60. This measures 5.0 x 2.1 cm, image 60 and correlates with an enlarged right ovary when compared to prior contrast exam.  No acute osseous finding.  IMPRESSION: Distal stomach and diffuse duodenal slightly wall prominence, nonspecific but could represent mild gastritis/ duodenitis. No associated bowel obstruction, abscess or free air.  No acute obstructing urinary tract calculus or hydronephrosis.  Normal appendix.  Slightly smaller but still enlarged right ovary measuring 5 x 2.1 cm. This remains nonspecific. Consider follow-up nonemergent pelvic ultrasound.   Electronically Signed   By: Daryll Brod M.D.   On: 05/11/2014 16:55   Dg Chest 2 View  05/11/2014   CLINICAL DATA:  Nausea, vomiting, diarrhea and headache.  EXAM: CHEST - 2 VIEW  COMPARISON:  CTA of the chest on 04/30/2011  FINDINGS: The heart size and mediastinal contours are within normal limits. There is no evidence of pulmonary edema, consolidation, pneumothorax, nodule or pleural fluid. The visualized skeletal structures are unremarkable.  IMPRESSION: No active disease.   Electronically Signed   By: Aletta Edouard M.D.   On: 05/11/2014 16:30   US Abdomen Limited  05/23/2014   CLINICAL DATA:  49 year old female with subacute right upper quadrant pain, vomiting and weight loss. Initial encounter.  EXAM: US ABDOMEN LIMITED - RIGHT UPPER QUADRANT  COMPARISON:  05/11/2014 and prior CTs.  FINDINGS: Gallbladder:  The  gallbladder is unremarkable. There is no evidence of cholelithiasis or acute cholecystitis.  Common bile duct:  Diameter: 3.0 mm. There is no evidence of intrahepatic or extrahepatic biliary dilatation.  Liver:  No focal lesion identified. Within normal limits in parenchymal echogenicity.  There is no evidence of ascites within the right upper abdomen.  IMPRESSION: Unremarkable right upper quadrant abdominal ultrasound.   Electronically Signed   By: Margarette Canada M.D.   On: 05/23/2014 13:21   Dg Chest Port 1 View  05/24/2014   CLINICAL DATA:  Feeling very weak para nausea and vomiting, diarrhea for 3 weeks.  EXAM: PORTABLE CHEST - 1 VIEW  COMPARISON:  05/11/2014  FINDINGS: Heart size is normal. Lungs are clear. No pulmonary edema. Visualized osseous structures have a normal appearance.  IMPRESSION: No active disease.   Electronically Signed   By: Nolon Nations M.D.   On: 05/24/2014 15:08  [2 weeks]   Assessment: 49 y/o female with 3-4 weeks h/o N/V and abdominal pain. Initially with diarrhea for one week or so. Last BM 48 hours ago. Stool studies not collected. Reports >17 pound weight loss.   EGD yesterday showed mild non-erosive inflammation gastric antrum s/p biopsy. Some resistance when passing gastroscope thru pylorus s/p balloon dilation of pylorus, duodenum with some atrophy s/p biopsy.   Of note, patient had TSH of <0.010, T4 pending. Notified attending  to address.   Overall feeling better today. Patient currently without IV access.  Plan: 1. Discussed with patient. We will try oral Zofran and pantoprazole today.  2. Full liquid diet. 3. Thyroid to be addressed by attending.  4. F/u pending biopsies.  5. Consider pelvic u/s as outpatient to follow up on enlarged right ovary seen on previous CT.  Laureen Ochs. Bernarda Caffey [facility] [phone] 2/17/201610:04 AM     LOS: 3 days    Attending note:  Patient seen and examined this afternoon. Nausea and  vomiting has subsided. Tolerating a full liquid diet. Agree with another 24 hours of observation and follow-up thyroid function studies per attending.

## 2014-05-28 ENCOUNTER — Encounter (HOSPITAL_COMMUNITY): Payer: Self-pay | Admitting: Internal Medicine

## 2014-05-28 DIAGNOSIS — N838 Other noninflammatory disorders of ovary, fallopian tube and broad ligament: Secondary | ICD-10-CM | POA: Diagnosis present

## 2014-05-28 DIAGNOSIS — R634 Abnormal weight loss: Secondary | ICD-10-CM | POA: Insufficient documentation

## 2014-05-28 LAB — BASIC METABOLIC PANEL
ANION GAP: 7 (ref 5–15)
BUN: 7 mg/dL (ref 6–23)
CALCIUM: 9.6 mg/dL (ref 8.4–10.5)
CO2: 27 mmol/L (ref 19–32)
Chloride: 106 mmol/L (ref 96–112)
Creatinine, Ser: 0.46 mg/dL — ABNORMAL LOW (ref 0.50–1.10)
GFR calc non Af Amer: >90 mL/min (ref >90)
GLUCOSE: 122 mg/dL — AB (ref 70–99)
Potassium: 3.3 mmol/L — ABNORMAL LOW (ref 3.5–5.1)
SODIUM: 140 mmol/L (ref 135–145)

## 2014-05-28 LAB — T3, FREE: T3 FREE: 9.6 pg/mL — AB (ref 2.0–4.4)

## 2014-05-28 LAB — CBC
HCT: 31.1 % — ABNORMAL LOW (ref 36.0–46.0)
Hemoglobin: 10.8 g/dL — ABNORMAL LOW (ref 12.0–15.0)
MCH: 29.5 pg (ref 26.0–34.0)
MCHC: 34.7 g/dL (ref 30.0–36.0)
MCV: 85 fL (ref 78.0–100.0)
PLATELETS: 183 10*3/uL (ref 150–400)
RBC: 3.66 MIL/uL — AB (ref 3.87–5.11)
RDW: 11.2 % — ABNORMAL LOW (ref 11.5–15.5)
WBC: 3.8 10*3/uL — AB (ref 4.0–10.5)

## 2014-05-28 MED ORDER — CIPROFLOXACIN HCL 250 MG PO TABS
500.0000 mg | ORAL_TABLET | Freq: Two times a day (BID) | ORAL | Status: DC
  Administered 2014-05-28: 500 mg via ORAL
  Filled 2014-05-28: qty 2

## 2014-05-28 MED ORDER — PANTOPRAZOLE SODIUM 40 MG PO TBEC: 40.0000 mg | DELAYED_RELEASE_TABLET | Freq: Two times a day (BID) | ORAL | Status: DC

## 2014-05-28 MED ORDER — CIPROFLOXACIN HCL 500 MG PO TABS: 500.0000 mg | ORAL_TABLET | Freq: Two times a day (BID) | ORAL | Status: DC

## 2014-05-28 MED ORDER — BOOST / RESOURCE BREEZE PO LIQD: 1.0000 | Freq: Three times a day (TID) | ORAL | Status: DC

## 2014-05-28 MED ORDER — POTASSIUM CHLORIDE CRYS ER 20 MEQ PO TBCR
40.0000 meq | EXTENDED_RELEASE_TABLET | Freq: Once | ORAL | Status: AC
  Administered 2014-05-28: 40 meq via ORAL
  Filled 2014-05-28: qty 2

## 2014-05-28 NOTE — Progress Notes (Signed)
NURSING PROGRESS NOTE  [name] [id number] Discharge Data: 05/28/2014 4:02 PM Attending Provider: No att. providers found PCP:No primary care provider on file.   [name] to be D/C'd Home per MD order.    All IV's discontinued and monitored for bleeding.  All belongings returned to patient for patient to take home.  AVS summary and prescriptions reviewed with patient and her spouse.   Patient left floor via wheelchair, escorted by patient advocate.  Last Documented Vital Signs:  Blood pressure 98/72, pulse 101, temperature 98.7 F (37.1 C), temperature source Oral, resp. rate 20, height 5\' 4"  (1.626 m), weight 58.3 kg (128 lb 8.5 oz), SpO2 100 %.  [name] D

## 2014-05-28 NOTE — Progress Notes (Signed)
Subjective:  Low grade fever over night 100.8. Patient complains of some nausea and vague diffuse abdominal pain. Abdominal pain improved. Some dysuria. Was hoping to go home but worried about fever.  Objective: Vital signs in last 24 hours: Temp:  [98.6 F (37 C)-100.7 F (38.2 C)] 98.6 F (37 C) (02/18 0603) Pulse Rate:  [84-115] 84 (02/18 0603) Resp:  [16-20] 20 (02/18 0603) BP: (113-139)/(60-85) 113/60 mmHg (02/18 0603) SpO2:  [100 %] 100 % (02/18 0603) Last BM Date: 05/28/14 General:   Alert,  Well-developed, well-nourished, pleasant and cooperative in NAD Head:  Normocephalic and atraumatic. Eyes:  Sclera clear, no icterus.  Abdomen:  Soft, mild diffuse tenderness and nondistended.  Normal bowel sounds, without guarding, and without rebound.   Extremities:  Without clubbing, deformity or edema. Neurologic:  Alert and  oriented x4;  grossly normal neurologically. Skin:  Intact without significant lesions or rashes. Psych:  Alert and cooperative. Normal mood and affect.  Intake/Output from previous day: 02/17 0701 - 02/18 0700 In: -  Out: 800 [Urine:800] Intake/Output this shift:    Lab Results: CBC  Recent Labs  05/26/14 0558 05/27/14 0550 05/28/14 0554  WBC 7.3 4.6 3.8*  HGB 10.4* 9.6* 10.8*  HCT 30.3* 28.8* 31.1*  MCV 85.6 85.5 85.0  PLT 161 158 183   BMET  Recent Labs  05/26/14 0558 05/27/14 0550 05/28/14 0554  NA 140 139 140  K 3.8 3.6 3.3*  CL 109 112 106  CO2 19 19 27   GLUCOSE 70 92 122*  BUN 9 7 7   CREATININE 0.58 0.49* 0.46*  CALCIUM 9.4 9.3 9.6   Cdiff pcr negative  LFTs No results for input(s): BILITOT, BILIDIR, IBILI, ALKPHOS, AST, ALT, PROT, ALBUMIN in the last 72 hours. No results for input(s): LIPASE in the last 72 hours. PT/INR No results for input(s): LABPROT, INR in the last 72 hours.    Imaging Studies: Ct Abdomen Pelvis Wo Contrast  05/23/2014   CLINICAL DATA:  49 year old female abdominal pain and vomiting. Endoscopy  yesterday. Initial encounter.  EXAM: CT ABDOMEN AND PELVIS WITHOUT CONTRAST  TECHNIQUE: Multidetector CT imaging of the abdomen and pelvis was performed following the standard protocol without IV contrast.  COMPARISON:  CT Abdomen and Pelvis 05/11/2014 and earlier  FINDINGS: minor lung base atelectasis. No pericardial or pleural effusion. No acute osseous abnormality identified.  Diminutive bladder. No pelvic free fluid. Uterus surgically absent. Stable right ovary.  No large bowel inflammation identified. Oral contrast has reached the terminal ileum. Appendix not identified today. No dilated small bowel. Negative stomach. No definite duodenum inflammation.  Noncontrast liver, gallbladder, spleen pancreas, adrenal glands, and kidneys appear stable and within normal limits. No abdominal free fluid or free air.  IMPRESSION: No definite acute or inflammatory process identified in the abdomen or pelvis in the absence of IV contrast.   Electronically Signed   By: Genevie Ann M.D.   On: 05/23/2014 17:13   Ct Abdomen Pelvis Wo Contrast  05/11/2014   CLINICAL DATA:  Persistent abdominal pain for approximately 13 days with vomiting.  EXAM: CT ABDOMEN AND PELVIS WITHOUT CONTRAST  TECHNIQUE: Multidetector CT imaging of the abdomen and pelvis was performed following the standard protocol without IV contrast. (Patient has a IV contrast allergy)  COMPARISON:  07/23/2010  FINDINGS: Lower chest: Clear lung bases. Normal heart size. No pericardial or pleural effusion.  Abdomen: The kidneys demonstrate no acute hydronephrosis, perinephric inflammation, or obstructing ureteral calculus on either side.  The liver, gallbladder, biliary  system, pancreas, spleen, and adrenal glands are within normal limits for age and noncontrast imaging.  Slight wall prominence of the distal stomach and the duodenum, difficult to exclude mild gastritis/ duodenitis.  No associated bowel obstruction, dilatation, ileus, or free air.  Pelvis: Prior  hysterectomy. No pelvic free fluid, fluid collection, hemorrhage, abscess, adenopathy, inguinal abnormality, or hernia. Urinary bladder unremarkable. Pelvic calcifications consistent with venous phleboliths. No acute distal bowel process. Portions of the appendix are demonstrated across the right iliac vessels, images 59 and 60 without evidence of appendicitis. Right lower quadrant lobulated soft tissue overlies the right iliopsoas muscle, image 60. This measures 5.0 x 2.1 cm, image 60 and correlates with an enlarged right ovary when compared to prior contrast exam.  No acute osseous finding.  IMPRESSION: Distal stomach and diffuse duodenal slightly wall prominence, nonspecific but could represent mild gastritis/ duodenitis. No associated bowel obstruction, abscess or free air.  No acute obstructing urinary tract calculus or hydronephrosis.  Normal appendix.  Slightly smaller but still enlarged right ovary measuring 5 x 2.1 cm. This remains nonspecific. Consider follow-up nonemergent pelvic ultrasound.   Electronically Signed   By: Daryll Brod M.D.   On: 05/11/2014 16:55   Dg Chest 2 View  05/11/2014   CLINICAL DATA:  Nausea, vomiting, diarrhea and headache.  EXAM: CHEST - 2 VIEW  COMPARISON:  CTA of the chest on 04/30/2011  FINDINGS: The heart size and mediastinal contours are within normal limits. There is no evidence of pulmonary edema, consolidation, pneumothorax, nodule or pleural fluid. The visualized skeletal structures are unremarkable.  IMPRESSION: No active disease.   Electronically Signed   By: Aletta Edouard M.D.   On: 05/11/2014 16:30   US Abdomen Limited  05/23/2014   CLINICAL DATA:  49 year old female with subacute right upper quadrant pain, vomiting and weight loss. Initial encounter.  EXAM: US ABDOMEN LIMITED - RIGHT UPPER QUADRANT  COMPARISON:  05/11/2014 and prior CTs.  FINDINGS: Gallbladder:  The gallbladder is unremarkable. There is no evidence of cholelithiasis or acute cholecystitis.   Common bile duct:  Diameter: 3.0 mm. There is no evidence of intrahepatic or extrahepatic biliary dilatation.  Liver:  No focal lesion identified. Within normal limits in parenchymal echogenicity.  There is no evidence of ascites within the right upper abdomen.  IMPRESSION: Unremarkable right upper quadrant abdominal ultrasound.   Electronically Signed   By: Margarette Canada M.D.   On: 05/23/2014 13:21   Dg Chest Port 1 View  05/24/2014   CLINICAL DATA:  Feeling very weak para nausea and vomiting, diarrhea for 3 weeks.  EXAM: PORTABLE CHEST - 1 VIEW  COMPARISON:  05/11/2014  FINDINGS: Heart size is normal. Lungs are clear. No pulmonary edema. Visualized osseous structures have a normal appearance.  IMPRESSION: No active disease.   Electronically Signed   By: Nolon Nations M.D.   On: 05/24/2014 15:08  [2 weeks]   Assessment: 49 y/o female with 3-4 weeks h/o N/V and abdominal pain. Initially with diarrhea for one week or so. Last BM 48 hours ago. Cdiff negative. Reports >17 pound weight loss.   EGD showed mild non-erosive inflammation gastric antrum s/p biopsy (chronic gastritis without H.pylori). Some resistance when passing gastroscope thru pylorus s/p balloon dilation of pylorus, duodenum with some atrophy s/p biopsy (benign).   Of note, patient had TSH of <0.010, T4, free T3 elevated. Attending managing. May be playing significant role in her symptomatology.   Patient reports vomiting resolved. Appetite very slow to return.  Plan: 1. Management of fever per attending. 2. Agree with further input from endocrinology. 3. Patient aware to follow up with gyn as outpatient for pelvic u/s for enlarged right ovary.  Laureen Ochs. Bernarda Caffey [facility] [phone] 2/18/20168:44 AM     LOS: 4 days

## 2014-05-28 NOTE — Discharge Summary (Signed)
Physician Discharge Summary  [name] OEU:[id number] DOB: 04/25/1965 DOA: 05/23/2014  PCP: No primary care provider on file.  Admit date: 05/23/2014 Discharge date: 05/28/2014  Time spent: 40 minutes  Recommendations for Outpatient Follow-up:  1. Dr Dorris Fetch 05/28/14 to follow thyroid function tests 2. PCP Dr Alroy Dust to follow nutritional status, persistent nausea. 3. OB gyn for monitoring of enlarge right ovary.   Discharge Diagnoses:  Active Problems:   Nausea & vomiting   Essential hypertension   Rapid weight loss   Tachycardia   Dehydration   GERD (gastroesophageal reflux disease)   Fever   Nausea with vomiting   UTI (urinary tract infection)   Abdominal pain   Abnormal TSH   Enlarged ovary   Discharge Condition: stable  Diet recommendation: full liquid to soft  Filed Weights   05/23/14 1107 05/23/14 1111 05/23/14 2005  Weight: 58.968 kg (130 lb) 54.885 kg (121 lb) 58.3 kg (128 lb 8.5 oz)    History of present illness:   49 yo with 3 weeks of ongoing and intermittent abdominal pain presented to ED on 05/23/14. Associated with nausea, diarrhea (only in the first week), vomiting. Emesis and diarrhea were non-bloody. got worse. Seen in emergency room on 05/11/2014 and 05/18/2014 with similar symptoms. Diagnosed with viral gastroenteritis. Zofran was some improvement. Worse with meals. Associated with anorexia and weight loss of 17 pounds. Feeling progressively  weak. Denied fevers, dysuria, no discharge, frequency, back pain, chest pain, shortness of breath, palpitations.  Hospital Course:   Abdominal pain/ intractable nausea and vomiting. May be related to pyloric stenosis s/p dilation 05/26/14 . Nausea improved with scheduled zofran. Tolerating soft diet at discharge. EGD 05/26/14 withmild non-erosive inflammation gastric antrum s/p biopsy and dilation of pylorus, duodenum. Thyroid studies consistent with hyperthyroidism which could be contributing. Has appointment with  endocrinology on day of discharge. Follow up with PCP 1-2 weeks for evaluation of appetite, n/v and weight.    UTI. Urine culture with >100,000 multiple bacterial morphotypes.recieved  Rocephin for 2 days.     Hypertension. Controlled.  Dehydration. Resolved at discharge  GERD. remains stable at baseline given #1. Continue PPI. Monitor  Hypokalemia: mild. Resolved.  Tachycardia: lmproved with IV fluids. Remains at high end of normal. May be related to low TSH. Asymptomatic  Abnormal TSH. TSH <0.010. T4 elevated at 15.1 and free T3 9.6. No hx thyroid problems. 17lb weight loss attributed to #1. Mild tachycardia. Exam benign. Has follow up appointment with Dr Dorris Fetch the day of discharge to follow   Procedures:   EGD  Consultations:  GI  Endocrinology (per phone)  Discharge Exam: Filed Vitals:   05/28/14 0603  BP: 113/60  Pulse: 84  Temp: 98.6 F (37 C)  Resp: 20    General: well nourished appears quite comfortable Cardiovascular: RRR No MGR no LE edema Respiratory: normal effort BS clear bilaterally Abd: non-distended sluggish BS non-tender  Discharge Instructions    Current Discharge Medication List    START taking these medications   Details  feeding supplement, RESOURCE BREEZE, (RESOURCE BREEZE) LIQD Take 1 Container by mouth 3 (three) times daily between meals. Refills: 0    pantoprazole (PROTONIX) 40 MG tablet Take 1 tablet (40 mg total) by mouth 2 (two) times daily before a meal. Qty: 60 tablet, Refills: 0      CONTINUE these medications which have NOT CHANGED   Details  ferrous sulfate 325 (65 FE) MG tablet Take 325 mg by mouth every other day.    hydrochlorothiazide (  HYDRODIURIL) 25 MG tablet Take 25 mg by mouth daily. Refills: 3    hyoscyamine (LEVSIN, ANASPAZ) 0.125 MG tablet Take 1 tablet by mouth every 4 (four) hours as needed for cramping.  Refills: 0    Ibuprofen-Diphenhydramine Cit (IBUPROFEN PM PO) Take 1 tablet by mouth at bedtime  as needed (pain/sleep).     Multiple Vitamin (MULITIVITAMIN WITH MINERALS) TABS Take 1 tablet by mouth daily.    naproxen sodium (ANAPROX) 220 MG tablet Take 220 mg by mouth 2 (two) times daily as needed (pain).     ondansetron (ZOFRAN ODT) 4 MG disintegrating tablet Take 1 tablet (4 mg total) by mouth every 8 (eight) hours as needed. Qty: 12 tablet, Refills: 1    ranitidine (ZANTAC) 150 MG tablet Take 150 mg by mouth 2 (two) times daily.      STOP taking these medications     promethazine (PHENERGAN) 25 MG tablet        Allergies  Allergen Reactions  . Contrast Media [Iodinated Diagnostic Agents] Rash  . Iohexol Nausea And Vomiting    Pt. Vomited immediately after injection of Omni 350.    . Vicodin [Hydrocodone-Acetaminophen] Rash   Follow-up Information    Follow up with NIDA,GEBRESELASSIE, MD On 05/28/2014.   Specialty:  Endocrinology   Why:  has appointment at [facility]. follow up on thyroid studies   Contact information:   [place] [address] [phone]       Follow up with Donnie Coffin, MD. Schedule an appointment as soon as possible for a visit in 1 week.   Specialty:  Family Medicine   Why:  follow persistent nausea/vomiting/weight loss   Contact information:   [address] [place] [address] [phone]        The results of significant diagnostics from this hospitalization (including imaging, microbiology, ancillary and laboratory) are listed below for reference.    Significant Diagnostic Studies: Ct Abdomen Pelvis Wo Contrast  05/23/2014   CLINICAL DATA:  49 year old female abdominal pain and vomiting. Endoscopy yesterday. Initial encounter.  EXAM: CT ABDOMEN AND PELVIS WITHOUT CONTRAST  TECHNIQUE: Multidetector CT imaging of the abdomen and pelvis was performed following the standard protocol without IV contrast.  COMPARISON:  CT Abdomen and Pelvis 05/11/2014 and earlier  FINDINGS: minor lung base atelectasis. No  pericardial or pleural effusion. No acute osseous abnormality identified.  Diminutive bladder. No pelvic free fluid. Uterus surgically absent. Stable right ovary.  No large bowel inflammation identified. Oral contrast has reached the terminal ileum. Appendix not identified today. No dilated small bowel. Negative stomach. No definite duodenum inflammation.  Noncontrast liver, gallbladder, spleen pancreas, adrenal glands, and kidneys appear stable and within normal limits. No abdominal free fluid or free air.  IMPRESSION: No definite acute or inflammatory process identified in the abdomen or pelvis in the absence of IV contrast.   Electronically Signed   By: Genevie Ann M.D.   On: 05/23/2014 17:13   Ct Abdomen Pelvis Wo Contrast  05/11/2014   CLINICAL DATA:  Persistent abdominal pain for approximately 13 days with vomiting.  EXAM: CT ABDOMEN AND PELVIS WITHOUT CONTRAST  TECHNIQUE: Multidetector CT imaging of the abdomen and pelvis was performed following the standard protocol without IV contrast. (Patient has a IV contrast allergy)  COMPARISON:  07/23/2010  FINDINGS: Lower chest: Clear lung bases. Normal heart size. No pericardial or pleural effusion.  Abdomen: The kidneys demonstrate no acute hydronephrosis, perinephric inflammation, or obstructing ureteral calculus on either side.  The liver, gallbladder, biliary system, pancreas, spleen, and adrenal glands are within normal limits for age and noncontrast imaging.  Slight wall prominence of the distal stomach and the duodenum, difficult to exclude mild gastritis/ duodenitis.  No associated bowel obstruction, dilatation, ileus, or free air.  Pelvis: Prior hysterectomy. No pelvic free fluid, fluid collection, hemorrhage, abscess, adenopathy, inguinal abnormality, or hernia. Urinary bladder unremarkable. Pelvic calcifications consistent with venous phleboliths. No acute distal bowel process. Portions of the appendix are demonstrated across the right iliac vessels, images  59 and 60 without evidence of appendicitis. Right lower quadrant lobulated soft tissue overlies the right iliopsoas muscle, image 60. This measures 5.0 x 2.1 cm, image 60 and correlates with an enlarged right ovary when compared to prior contrast exam.  No acute osseous finding.  IMPRESSION: Distal stomach and diffuse duodenal slightly wall prominence, nonspecific but could represent mild gastritis/ duodenitis. No associated bowel obstruction, abscess or free air.  No acute obstructing urinary tract calculus or hydronephrosis.  Normal appendix.  Slightly smaller but still enlarged right ovary measuring 5 x 2.1 cm. This remains nonspecific. Consider follow-up nonemergent pelvic ultrasound.   Electronically Signed   By: Daryll Brod M.D.   On: 05/11/2014 16:55   Dg Chest 2 View  05/11/2014   CLINICAL DATA:  Nausea, vomiting, diarrhea and headache.  EXAM: CHEST - 2 VIEW  COMPARISON:  CTA of the chest on 04/30/2011  FINDINGS: The heart size and mediastinal contours are within normal limits. There is no evidence of pulmonary edema, consolidation, pneumothorax, nodule or pleural fluid. The visualized skeletal structures are unremarkable.  IMPRESSION: No active disease.   Electronically Signed   By: Aletta Edouard M.D.   On: 05/11/2014 16:30   US Abdomen Limited  05/23/2014   CLINICAL DATA:  49 year old female with subacute right upper quadrant pain, vomiting and weight loss. Initial encounter.  EXAM: US ABDOMEN LIMITED - RIGHT UPPER QUADRANT  COMPARISON:  05/11/2014 and prior CTs.  FINDINGS: Gallbladder:  The gallbladder is unremarkable. There is no evidence of cholelithiasis or acute cholecystitis.  Common bile duct:  Diameter: 3.0 mm. There is no evidence of intrahepatic or extrahepatic biliary dilatation.  Liver:  No focal lesion identified. Within normal limits in parenchymal echogenicity.  There is no evidence of ascites within the right upper abdomen.  IMPRESSION: Unremarkable right upper quadrant abdominal  ultrasound.   Electronically Signed   By: Margarette Canada M.D.   On: 05/23/2014 13:21   Dg Chest Port 1 View  05/24/2014   CLINICAL DATA:  Feeling very weak para nausea and vomiting, diarrhea for 3 weeks.  EXAM: PORTABLE CHEST - 1 VIEW  COMPARISON:  05/11/2014  FINDINGS: Heart size is normal. Lungs are clear. No pulmonary edema. Visualized osseous structures have a normal appearance.  IMPRESSION: No active disease.   Electronically Signed   By: Nolon Nations M.D.   On: 05/24/2014 15:08    Microbiology: Recent Results (from the past 240 hour(s))  Culture, Urine     Status: None   Collection Time: 05/23/14  5:54 PM  Result Value Ref Range Status   Specimen Description URINE, RANDOM  Final   Special Requests NONE  Final   Colony Count   Final    >=100,000 COLONIES/ML Performed at [facility]    Culture   Final    Multiple bacterial morphotypes present, none predominant. Suggest appropriate recollection if clinically indicated. Performed at [facility]    Report Status 05/26/2014 FINAL  Final  Culture, blood (routine x 2)     Status: None (Preliminary result)   Collection Time: 05/24/14  7:02 AM  Result Value Ref Range Status   Specimen Description BLOOD RIGHT ANTECUBITAL  Final   Special Requests BOTTLES DRAWN AEROBIC AND ANAEROBIC 6CC  Final   Culture NO GROWTH 4 DAYS  Final   Report Status PENDING  Incomplete  Culture, blood (routine x 2)     Status: None (Preliminary result)   Collection Time: 05/24/14  7:02 AM  Result Value Ref Range Status   Specimen Description BLOOD RIGHT HAND  Final   Special Requests BOTTLES DRAWN AEROBIC AND ANAEROBIC 6CC  Final   Culture NO GROWTH 4 DAYS  Final   Report Status PENDING  Incomplete  Clostridium Difficile by PCR     Status: None   Collection Time: 05/27/14  7:17 PM  Result Value Ref Range Status   C difficile by pcr NEGATIVE NEGATIVE Final     Labs: Basic Metabolic Panel:  Recent Labs Lab 05/24/14 0601  05/25/14 0627 05/26/14 0558 05/27/14 0550 05/28/14 0554  NA 138 139 140 139 140  K 3.8 3.2* 3.8 3.6 3.3*  CL 114* 109 109 112 106  CO2 19 22 19 19 27   GLUCOSE 69* 102* 70 92 122*  BUN 12 6 9 7 7   CREATININE 0.62 0.48* 0.58 0.49* 0.46*  CALCIUM 8.6 9.1 9.4 9.3 9.6   Liver Function Tests:  Recent Labs Lab 05/23/14 1208 05/24/14 0601 05/25/14 0627  AST 20 18 27   ALT 16 15 23   ALKPHOS 54 38* 39  BILITOT 0.7 0.9 1.0  PROT 7.7 5.4* 5.8*  ALBUMIN 4.1 2.9* 3.0*    Recent Labs Lab 05/23/14 1208  LIPASE 77*   No results for input(s): AMMONIA in the last 168 hours. CBC:  Recent Labs Lab 05/23/14 1208 05/24/14 0601 05/25/14 0627 05/26/14 0558 05/27/14 0550 05/28/14 0554  WBC 5.8 5.3 4.6 7.3 4.6 3.8*  NEUTROABS 3.1  --   --   --   --   --   HGB 13.1 9.2* 9.4* 10.4* 9.6* 10.8*  HCT 38.7 28.0* 27.9* 30.3* 28.8* 31.1*  MCV 86.4 88.9 86.1 85.6 85.5 85.0  PLT 230 169 152 161 158 183   Cardiac Enzymes: No results for input(s): CKTOTAL, CKMB, CKMBINDEX, TROPONINI in the last 168 hours. BNP: BNP (last 3 results) No results for input(s): BNP in the last 8760 hours.  ProBNP (last 3 results) No results for input(s): PROBNP in the last 8760 hours.  CBG: No results for input(s): GLUCAP in the last 168 hours.     SignedRadene Gunning  [facility] 05/28/2014, 12:24 PM

## 2014-05-29 LAB — CULTURE, BLOOD (ROUTINE X 2)
Culture: NO GROWTH
Culture: NO GROWTH

## 2014-06-01 ENCOUNTER — Emergency Department (HOSPITAL_COMMUNITY): Payer: 59

## 2014-06-01 ENCOUNTER — Inpatient Hospital Stay (HOSPITAL_COMMUNITY)
Admission: EM | Admit: 2014-06-01 | Discharge: 2014-06-05 | DRG: 643 | Disposition: A | Discharge status: Home / Self Care | Payer: 59 | Attending: Internal Medicine | Admitting: Internal Medicine

## 2014-06-01 ENCOUNTER — Encounter (HOSPITAL_COMMUNITY): Payer: Self-pay

## 2014-06-01 DIAGNOSIS — E0520 Thyrotoxicosis with toxic multinodular goiter without thyrotoxic crisis or storm: Principal | ICD-10-CM | POA: Diagnosis present

## 2014-06-01 DIAGNOSIS — R7989 Other specified abnormal findings of blood chemistry: Secondary | ICD-10-CM | POA: Insufficient documentation

## 2014-06-01 DIAGNOSIS — Z90710 Acquired absence of both cervix and uterus: Secondary | ICD-10-CM | POA: Diagnosis not present

## 2014-06-01 DIAGNOSIS — K219 Gastro-esophageal reflux disease without esophagitis: Secondary | ICD-10-CM | POA: Diagnosis present

## 2014-06-01 DIAGNOSIS — E876 Hypokalemia: Secondary | ICD-10-CM | POA: Diagnosis present

## 2014-06-01 DIAGNOSIS — I10 Essential (primary) hypertension: Secondary | ICD-10-CM | POA: Diagnosis present

## 2014-06-01 DIAGNOSIS — E041 Nontoxic single thyroid nodule: Secondary | ICD-10-CM | POA: Diagnosis present

## 2014-06-01 DIAGNOSIS — R064 Hyperventilation: Secondary | ICD-10-CM | POA: Diagnosis present

## 2014-06-01 DIAGNOSIS — K5900 Constipation, unspecified: Secondary | ICD-10-CM | POA: Diagnosis present

## 2014-06-01 DIAGNOSIS — K859 Acute pancreatitis without necrosis or infection, unspecified: Secondary | ICD-10-CM | POA: Diagnosis present

## 2014-06-01 DIAGNOSIS — Z6821 Body mass index (BMI) 21.0-21.9, adult: Secondary | ICD-10-CM

## 2014-06-01 DIAGNOSIS — J45909 Unspecified asthma, uncomplicated: Secondary | ICD-10-CM | POA: Diagnosis present

## 2014-06-01 DIAGNOSIS — R000 Tachycardia, unspecified: Secondary | ICD-10-CM

## 2014-06-01 DIAGNOSIS — N390 Urinary tract infection, site not specified: Secondary | ICD-10-CM | POA: Diagnosis present

## 2014-06-01 DIAGNOSIS — R112 Nausea with vomiting, unspecified: Secondary | ICD-10-CM | POA: Diagnosis present

## 2014-06-01 DIAGNOSIS — E0590 Thyrotoxicosis, unspecified without thyrotoxic crisis or storm: Secondary | ICD-10-CM

## 2014-06-01 DIAGNOSIS — E440 Moderate protein-calorie malnutrition: Secondary | ICD-10-CM | POA: Diagnosis present

## 2014-06-01 DIAGNOSIS — R1084 Generalized abdominal pain: Secondary | ICD-10-CM

## 2014-06-01 DIAGNOSIS — E860 Dehydration: Secondary | ICD-10-CM | POA: Diagnosis present

## 2014-06-01 DIAGNOSIS — R079 Chest pain, unspecified: Secondary | ICD-10-CM

## 2014-06-01 DIAGNOSIS — E873 Alkalosis: Secondary | ICD-10-CM | POA: Diagnosis present

## 2014-06-01 DIAGNOSIS — R109 Unspecified abdominal pain: Secondary | ICD-10-CM | POA: Diagnosis present

## 2014-06-01 DIAGNOSIS — R4182 Altered mental status, unspecified: Secondary | ICD-10-CM | POA: Diagnosis present

## 2014-06-01 DIAGNOSIS — R531 Weakness: Secondary | ICD-10-CM

## 2014-06-01 DIAGNOSIS — N83209 Unspecified ovarian cyst, unspecified side: Secondary | ICD-10-CM

## 2014-06-01 LAB — URINALYSIS, ROUTINE W REFLEX MICROSCOPIC
Bilirubin Urine: NEGATIVE
Glucose, UA: NEGATIVE mg/dL
Ketones, ur: >80 mg/dL — AB
Nitrite: NEGATIVE
PH: 7.5 (ref 5.0–8.0)
Protein, ur: NEGATIVE mg/dL
Specific Gravity, Urine: 1.015 (ref 1.005–1.030)
Urobilinogen, UA: 0.2 mg/dL (ref 0.0–1.0)

## 2014-06-01 LAB — RAPID URINE DRUG SCREEN, HOSP PERFORMED
AMPHETAMINES: NOT DETECTED
Barbiturates: NOT DETECTED
Benzodiazepines: NOT DETECTED
COCAINE: NOT DETECTED
OPIATES: NOT DETECTED
Tetrahydrocannabinol: NOT DETECTED

## 2014-06-01 LAB — ACETAMINOPHEN LEVEL: Acetaminophen (Tylenol), Serum: <10 ug/mL — ABNORMAL LOW (ref 10–30)

## 2014-06-01 LAB — CBC WITH DIFFERENTIAL/PLATELET
Basophils Absolute: 0 10*3/uL (ref 0.0–0.1)
Basophils Relative: 0 % (ref 0–1)
Eosinophils Absolute: 0 10*3/uL (ref 0.0–0.7)
Eosinophils Relative: 0 % (ref 0–5)
HCT: 38.2 % (ref 36.0–46.0)
Hemoglobin: 12.7 g/dL (ref 12.0–15.0)
LYMPHS PCT: 16 % (ref 12–46)
Lymphs Abs: 1.3 10*3/uL (ref 0.7–4.0)
MCH: 28.4 pg (ref 26.0–34.0)
MCHC: 33.2 g/dL (ref 30.0–36.0)
MCV: 85.5 fL (ref 78.0–100.0)
MONO ABS: 0.9 10*3/uL (ref 0.1–1.0)
Monocytes Relative: 11 % (ref 3–12)
Neutro Abs: 5.9 10*3/uL (ref 1.7–7.7)
Neutrophils Relative %: 73 % (ref 43–77)
PLATELETS: 261 10*3/uL (ref 150–400)
RBC: 4.47 MIL/uL (ref 3.87–5.11)
RDW: 11.1 % — ABNORMAL LOW (ref 11.5–15.5)
WBC: 8.1 10*3/uL (ref 4.0–10.5)

## 2014-06-01 LAB — TROPONIN I

## 2014-06-01 LAB — COMPREHENSIVE METABOLIC PANEL
ALBUMIN: 3.9 g/dL (ref 3.5–5.2)
ALK PHOS: 54 U/L (ref 39–117)
ALT: 25 U/L (ref 0–35)
ANION GAP: 7 (ref 5–15)
AST: 24 U/L (ref 0–37)
BUN: 22 mg/dL (ref 6–23)
CO2: 27 mmol/L (ref 19–32)
Calcium: 10.4 mg/dL (ref 8.4–10.5)
Chloride: 102 mmol/L (ref 96–112)
Creatinine, Ser: 0.67 mg/dL (ref 0.50–1.10)
GFR calc Af Amer: >90 mL/min (ref >90)
GFR calc non Af Amer: >90 mL/min (ref >90)
Glucose, Bld: 110 mg/dL — ABNORMAL HIGH (ref 70–99)
Potassium: 3.6 mmol/L (ref 3.5–5.1)
SODIUM: 136 mmol/L (ref 135–145)
Total Bilirubin: 0.8 mg/dL (ref 0.3–1.2)
Total Protein: 7.7 g/dL (ref 6.0–8.3)

## 2014-06-01 LAB — LIPASE, BLOOD: LIPASE: 94 U/L — AB (ref 11–59)

## 2014-06-01 LAB — BLOOD GAS, VENOUS
Acid-Base Excess: 1.4 mmol/L (ref 0.0–2.0)
Bicarbonate: 23.8 mEq/L (ref 20.0–24.0)
FIO2: 0.21 %
O2 CONTENT: 21 L/min
O2 SAT: 99.2 %
PATIENT TEMPERATURE: 37
TCO2: 20.6 mmol/L (ref 0–100)
pCO2, Ven: 27.4 mmHg — ABNORMAL LOW (ref 45.0–50.0)
pH, Ven: 7.549 — ABNORMAL HIGH (ref 7.250–7.300)
pO2, Ven: 176 mmHg — ABNORMAL HIGH (ref 30.0–45.0)

## 2014-06-01 LAB — URINE MICROSCOPIC-ADD ON

## 2014-06-01 LAB — TSH: TSH: 0.011 u[IU]/mL — AB (ref 0.350–4.500)

## 2014-06-01 LAB — ETHANOL: Alcohol, Ethyl (B): <5 mg/dL (ref 0–9)

## 2014-06-01 LAB — LACTIC ACID, PLASMA: Lactic Acid, Venous: 1.1 mmol/L (ref 0.5–2.0)

## 2014-06-01 LAB — I-STAT CG4 LACTIC ACID, ED: Lactic Acid, Venous: 1.87 mmol/L (ref 0.5–2.0)

## 2014-06-01 LAB — SALICYLATE LEVEL: Salicylate Lvl: <4 mg/dL (ref 2.8–20.0)

## 2014-06-01 LAB — D-DIMER, QUANTITATIVE (NOT AT ARMC): D DIMER QUANT: 1.44 ug{FEU}/mL — AB (ref 0.00–0.48)

## 2014-06-01 MED ORDER — SODIUM CHLORIDE 0.9 % IV SOLN: Freq: Once | INTRAVENOUS | Status: AC

## 2014-06-01 MED ORDER — ONDANSETRON HCL 4 MG/2ML IJ SOLN
4.0000 mg | Freq: Four times a day (QID) | INTRAMUSCULAR | Status: DC | PRN
  Administered 2014-06-01 – 2014-06-02 (×2): 4 mg via INTRAVENOUS
  Filled 2014-06-01 (×3): qty 2

## 2014-06-01 MED ORDER — HEPARIN SODIUM (PORCINE) 5000 UNIT/ML IJ SOLN
5000.0000 [IU] | Freq: Three times a day (TID) | INTRAMUSCULAR | Status: DC
  Filled 2014-06-01: qty 1

## 2014-06-01 MED ORDER — GI COCKTAIL ~~LOC~~: 30.0000 mL | Freq: Three times a day (TID) | ORAL | Status: DC | PRN

## 2014-06-01 MED ORDER — PANTOPRAZOLE SODIUM 40 MG PO TBEC
40.0000 mg | DELAYED_RELEASE_TABLET | Freq: Two times a day (BID) | ORAL | Status: DC
  Administered 2014-06-02 – 2014-06-05 (×7): 40 mg via ORAL
  Filled 2014-06-01 (×7): qty 1

## 2014-06-01 MED ORDER — SENNA 8.6 MG PO TABS
1.0000 | ORAL_TABLET | Freq: Two times a day (BID) | ORAL | Status: DC
  Administered 2014-06-02 – 2014-06-04 (×5): 8.6 mg via ORAL
  Filled 2014-06-01 (×8): qty 1

## 2014-06-01 MED ORDER — SODIUM CHLORIDE 0.9 % IV BOLUS (SEPSIS)
1000.0000 mL | Freq: Once | INTRAVENOUS | Status: AC
  Administered 2014-06-01: 1000 mL via INTRAVENOUS

## 2014-06-01 MED ORDER — POLYETHYLENE GLYCOL 3350 17 G PO PACK: 17.0000 g | PACK | Freq: Every day | ORAL | Status: DC | PRN

## 2014-06-01 MED ORDER — ONDANSETRON HCL 4 MG PO TABS: 4.0000 mg | ORAL_TABLET | Freq: Four times a day (QID) | ORAL | Status: DC | PRN

## 2014-06-01 MED ORDER — PROPRANOLOL HCL 20 MG PO TABS
20.0000 mg | ORAL_TABLET | Freq: Two times a day (BID) | ORAL | Status: DC
  Administered 2014-06-02 – 2014-06-05 (×7): 20 mg via ORAL
  Filled 2014-06-01 (×8): qty 1

## 2014-06-01 MED ORDER — ACETAMINOPHEN 325 MG PO TABS
650.0000 mg | ORAL_TABLET | Freq: Four times a day (QID) | ORAL | Status: DC | PRN
  Administered 2014-06-05: 650 mg via ORAL
  Filled 2014-06-01: qty 2

## 2014-06-01 MED ORDER — PHENAZOPYRIDINE HCL 100 MG PO TABS
100.0000 mg | ORAL_TABLET | Freq: Three times a day (TID) | ORAL | Status: DC
  Administered 2014-06-02 – 2014-06-04 (×7): 100 mg via ORAL
  Filled 2014-06-01 (×10): qty 1

## 2014-06-01 MED ORDER — DEXTROSE 5 % IV SOLN
1.0000 g | INTRAVENOUS | Status: DC
  Administered 2014-06-01 – 2014-06-04 (×4): 1 g via INTRAVENOUS
  Filled 2014-06-01 (×6): qty 10

## 2014-06-01 MED ORDER — SODIUM CHLORIDE 0.9 % IV SOLN
INTRAVENOUS | Status: DC
  Administered 2014-06-01: 23:00:00 via INTRAVENOUS

## 2014-06-01 MED ORDER — ACETAMINOPHEN 650 MG RE SUPP: 650.0000 mg | Freq: Four times a day (QID) | RECTAL | Status: DC | PRN

## 2014-06-01 MED ORDER — SODIUM CHLORIDE 0.9 % IV SOLN: INTRAVENOUS | Status: DC

## 2014-06-01 MED ORDER — METHIMAZOLE 5 MG PO TABS
10.0000 mg | ORAL_TABLET | Freq: Every day | ORAL | Status: DC
  Filled 2014-06-01 (×2): qty 1

## 2014-06-01 MED ORDER — SODIUM CHLORIDE 0.9 % IJ SOLN
3.0000 mL | Freq: Two times a day (BID) | INTRAMUSCULAR | Status: DC
  Administered 2014-06-01 – 2014-06-05 (×5): 3 mL via INTRAVENOUS

## 2014-06-01 MED ORDER — PROMETHAZINE HCL 12.5 MG PO TABS: 25.0000 mg | ORAL_TABLET | Freq: Four times a day (QID) | ORAL | Status: DC | PRN

## 2014-06-01 MED ORDER — MORPHINE SULFATE 4 MG/ML IJ SOLN: 4.0000 mg | INTRAMUSCULAR | Status: DC | PRN

## 2014-06-01 MED ORDER — DEXTROSE 5 % IV SOLN
INTRAVENOUS | Status: AC
  Filled 2014-06-01: qty 10

## 2014-06-01 NOTE — ED Notes (Signed)
Per EMS, pt was at her doctors office earlier but was to weak to go inside. Per EMS, pt was found in the back seat of her car.

## 2014-06-01 NOTE — ED Notes (Signed)
Pt was given Zofran 4 mg by EMS, prior to arrival

## 2014-06-01 NOTE — ED Provider Notes (Signed)
CSN: [id number]     Arrival date & time 06/01/14  1455 History  This chart was scribed for Ezequiel Essex, MD by Stephania Fragmin, ED Scribe. This patient was seen in room [facility] and the patient's care was started at 3:03 PM.     Chief Complaint  Patient presents with  . Emesis   The history is provided by the EMS personnel, the spouse and the patient. No language interpreter was used.     LEVEL 5 CAVEAT DUE TO MENTAL STATUS CHANGE  HPI Comments: [name] is a 49 y.o. female with a history of hypertension who presents to the Emergency Department for constant, worsening generalized weakness, nausea, and vomiting.   Patient has been seen in the ED 3 times for nausea and vomiting. She was recently admitted here for about 6 days and seemed to improve with an IV; patient was discharged home because her vomiting subsided. However, her symptoms returned and progressively worsened while she was at home. She was diagnosed with hyperthyroidism. Patient is borderline diabetic. She had 2 CT scans, an endoscopy, and a US gallbladder when she was seen. She had a cyst on her ovary that was enlarged.  Her husband reports that she has not been eating or drinking much all week, and she has been sleeping all day today. He reports she also started having severe episodes of vomiting bile. She also notes some abdominal pain and a headache. Per EMS, her head felt cool, and her stomach and back felt very warm. She vomited her hyperthyroid medication the last time her husband gave it to her. He denies a possibility of overdose, as he gives her her medication daily. Husband denies fall or head injury. Husband denies a past history of cholecystectomy, appendectomy, or any other abdominal surgeries. Patient denies diarrhea. She has NKDA.     Past Medical History  Diagnosis Date  . Hypertension   . Asthma   . Hyperthyroidism   . Enlarged ovary     right CT 05/2014   Past Surgical History  Procedure Laterality Date   . Abdominal hysterectomy    . Ovarian cyst surgery    . Esophagogastroduodenoscopy N/A 05/26/2014    Procedure: ESOPHAGOGASTRODUODENOSCOPY (EGD);  Surgeon: Danie Binder, MD;  Location: [facility];  Service: Endoscopy;  Laterality: N/A;  . Balloon dilation N/A 05/26/2014    Procedure: BALLOON DILATION OF THE PYLORUS;  Surgeon: Danie Binder, MD;  Location: [facility];  Service: Endoscopy;  Laterality: N/A;   Family History  Problem Relation Age of Onset  . Diabetes Mother   . Diabetes Father   . Hypertension Mother   . Hypertension Father    History  Substance Use Topics  . Smoking status: Never Smoker   . Smokeless tobacco: Not on file  . Alcohol Use: No   OB History    No data available     Review of Systems  Unable to perform ROS: Mental status change    Allergies  Contrast media; Iohexol; and Vicodin  Home Medications   Prior to Admission medications   Medication Sig Start Date End Date Taking? Authorizing Provider  methimazole (TAPAZOLE) 10 MG tablet Take 10 mg by mouth daily.   Yes Historical Provider, MD  Multiple Vitamin (MULITIVITAMIN WITH MINERALS) TABS Take 1 tablet by mouth daily.   Yes Historical Provider, MD  pantoprazole (PROTONIX) 40 MG tablet Take 1 tablet (40 mg total) by mouth 2 (two) times daily before a meal. 05/28/14  Yes  Radene Gunning, NP  promethazine (PHENERGAN) 25 MG tablet Take 25 mg by mouth every 6 (six) hours as needed for nausea or vomiting.   Yes Historical Provider, MD  propranolol (INDERAL) 20 MG tablet Take 20 mg by mouth 2 (two) times daily.   Yes Historical Provider, MD  ondansetron (ZOFRAN ODT) 4 MG disintegrating tablet Take 1 tablet (4 mg total) by mouth every 8 (eight) hours as needed. 05/11/14   Fredia Sorrow, MD   BP 106/52 mmHg  Pulse 111  Temp(Src) 99.2 F (37.3 C) (Oral)  Resp 20  Ht 5\' 4"  (1.626 m)  Wt 127 lb 3.3 oz (57.7 kg)  BMI 21.82 kg/m2  SpO2 100% Physical Exam  Constitutional: She is oriented to person,  place, and time. She appears well-developed and well-nourished. No distress.  Obtunded, oriented x 2. Tearful and moaning.  HENT:  Head: Normocephalic and atraumatic.  Mouth/Throat: Oropharynx is clear and moist. No oropharyngeal exudate.  Moist mucous membranes.  Eyes: Conjunctivae and EOM are normal. Pupils are equal, round, and reactive to light.  Neck: Normal range of motion. Neck supple.  No meningismus.  Cardiovascular: Normal rate, regular rhythm, normal heart sounds and intact distal pulses.   No murmur heard. Pulmonary/Chest: Effort normal and breath sounds normal. No respiratory distress.  Abdominal: Soft. There is tenderness. There is no rebound and no guarding.  Diffuse abdominal tenderness.  Musculoskeletal: Normal range of motion. She exhibits no edema or tenderness.  Neurological: She is alert and oriented to person, place, and time. No cranial nerve deficit. She exhibits normal muscle tone. Coordination normal.  Globally weak. 4/5 strength throughout.   Skin: Skin is warm.  Psychiatric: She has a normal mood and affect. Her behavior is normal.  Nursing note and vitals reviewed.   ED Course  Procedures (including critical care time)  DIAGNOSTIC STUDIES: Oxygen Saturation is 100% on room air, normal by my interpretation.    COORDINATION OF CARE:   Labs Review Labs Reviewed  CBC WITH DIFFERENTIAL/PLATELET - Abnormal; Notable for the following:    RDW 11.1 (*)    All other components within normal limits  COMPREHENSIVE METABOLIC PANEL - Abnormal; Notable for the following:    Glucose, Bld 110 (*)    All other components within normal limits  LIPASE, BLOOD - Abnormal; Notable for the following:    Lipase 94 (*)    All other components within normal limits  ACETAMINOPHEN LEVEL - Abnormal; Notable for the following:    Acetaminophen (Tylenol), Serum <10.0 (*)    All other components within normal limits  BLOOD GAS, VENOUS - Abnormal; Notable for the following:     pH, Ven 7.549 (*)    pCO2, Ven 27.4 (*)    pO2, Ven 176.0 (*)    All other components within normal limits  URINALYSIS, ROUTINE W REFLEX MICROSCOPIC - Abnormal; Notable for the following:    Hgb urine dipstick TRACE (*)    Ketones, ur >80 (*)    Leukocytes, UA LARGE (*)    All other components within normal limits  TSH - Abnormal; Notable for the following:    TSH 0.011 (*)    All other components within normal limits  URINE MICROSCOPIC-ADD ON - Abnormal; Notable for the following:    Squamous Epithelial / LPF MANY (*)    Bacteria, UA MANY (*)    All other components within normal limits  D-DIMER, QUANTITATIVE - Abnormal; Notable for the following:    D-Dimer, Quant 1.44 (*)  All other components within normal limits  URINE CULTURE  TROPONIN I  SALICYLATE LEVEL  ETHANOL  URINE RAPID DRUG SCREEN (HOSP PERFORMED)  LACTIC ACID, PLASMA  TROPONIN I  T4, FREE  COMPREHENSIVE METABOLIC PANEL  CBC  LACTIC ACID, PLASMA  TROPONIN I  I-STAT CG4 LACTIC ACID, ED  I-STAT CG4 LACTIC ACID, ED    Imaging Review Ct Head Wo Contrast  06/01/2014   CLINICAL DATA:  49 year old female with altered mental status weakness nausea vomiting diarrhea and headache. Initial encounter.  EXAM: CT HEAD WITHOUT CONTRAST  TECHNIQUE: Contiguous axial images were obtained from the base of the skull through the vertex without intravenous contrast.  COMPARISON:  Normal noncontrast CT appearance of the brain. MRI 07/18/2011 and earlier.  FINDINGS: Visualized paranasal sinuses and mastoids are clear. No acute osseous abnormality identified. Visualized orbits and scalp soft tissues are within normal limits.  Cerebral volume is stable and within normal limits for age. No midline shift, ventriculomegaly, mass effect, evidence of mass lesion, intracranial hemorrhage or evidence of cortically based acute infarction. Gray-white matter differentiation is within normal limits throughout the brain. No suspicious intracranial  vascular hyperdensity.  IMPRESSION: Stable and normal noncontrast CT appearance of the brain.   Electronically Signed   By: Genevie Ann M.D.   On: 06/01/2014 16:16   Dg Abd Acute W/chest  06/01/2014   CLINICAL DATA:  49 year old female with weakness.  EXAM: ACUTE ABDOMEN SERIES (ABDOMEN 2 VIEW & CHEST 1 VIEW)  COMPARISON:  Chest x-ray 05/24/2014. CT of the abdomen and pelvis 05/23/2014.  FINDINGS: Lung volumes are normal. No consolidative airspace disease. No pleural effusions. No pneumothorax. No pulmonary nodule or mass noted. Pulmonary vasculature and the cardiomediastinal silhouette are within normal limits.  Gas and stool are seen scattered throughout the colon extending to the level of the distal rectum. No pathologic distension of small bowel is noted. No gross evidence of pneumoperitoneum.  IMPRESSION: 1.  Nonobstructive bowel gas pattern. 2. No pneumoperitoneum. 3. No radiographic evidence of acute cardiopulmonary disease.   Electronically Signed   By: Vinnie Langton M.D.   On: 06/01/2014 16:38     EKG Interpretation   Date/Time:  Monday June 01 2014 17:05:46 EST Ventricular Rate:  110 PR Interval:  160 QRS Duration: 82 QT Interval:  358 QTC Calculation: 484 R Axis:   78 Text Interpretation:  Sinus tachycardia Otherwise normal ECG Rate faster  Confirmed by Wyvonnia Dusky  MD, Vera Furniss [phone]) on 06/01/2014 5:17:11 PM      MDM   Final diagnoses:  Altered mental status, unspecified altered mental status type  Generalized weakness  Hyperthyroidism    Patient with generalized weakness, nausea, vomiting, altered mental status for the past 3 days. Discharge from the hospital 4 days ago after extensive workup for nausea and vomiting. She had negative CT scan, ultrasound, EGD. Found to have mildly elevated thyroid hormone.  Patient is obtunded, confused, nauseated with vomiting. Her abdomen is diffusely tender.  Lactate normal. Anion gap normal. ABG with respiratory alkalosis from  hyperventilation. Lipase mildly elevated at 94.  Head CT negative. Abdominal x-ray shows no bowel obstruction.  Patient with persistent tachycardia on exam to the 1 teens. She is unable to participate in giving history. Generalized weakness and inability to ambulate.  Will need admission. Question whether her hyperthyroidism is contributing to her symptoms. UA appears contaminated and antibiotics will be restarted. D/w Dr. Marily Memos. D-dimer ordered by admitting team. Patient with IV contrast allergy. No hypoxia, SOB, or chest pain.  I personally performed the services described in this documentation, which was scribed in my presence. The recorded information has been reviewed and is accurate.   Ezequiel Essex, MD 06/01/14 [phone]

## 2014-06-01 NOTE — H&P (Addendum)
[facility] History and Physical  [name] OQH:[id number] DOB: Aug 28, 1965 DOA: 06/01/2014  Referring physician: Dr Wyvonnia Dusky - [facility] PCP: No primary care provider on file.   Chief Complaint: Nausea vomiting and CP  HPI: [name] is a 49 y.o. female  2 day history of nausea and vomiting. Associated with epigastric pain. Comes and goes. Worse with food. Denies fever, diarrhea, rash. Patient also endorsing intermittent chest pain which is described as pressure in the center of her chest associated with shortness of breath. Worse with breathing. Onset of dysuria without frequency today. Denies back pain. Symptoms are getting worse. Patient has not tried anything to alleviate any of her symptoms.  Review of Systems:  Per HPI. Pt not willing to participate during exam due to pain    Past Medical History  Diagnosis Date  . Hypertension   . Asthma   . Hyperthyroidism   . Enlarged ovary     right CT 05/2014   Past Surgical History  Procedure Laterality Date  . Abdominal hysterectomy    . Ovarian cyst surgery    . Esophagogastroduodenoscopy N/A 05/26/2014    Procedure: ESOPHAGOGASTRODUODENOSCOPY (EGD);  Surgeon: Danie Binder, MD;  [facility];  Service: Endoscopy;  Laterality: N/A;  . Balloon dilation N/A 05/26/2014    Procedure: BALLOON DILATION OF THE PYLORUS;  Surgeon: Danie Binder, MD;  Location: [facility];  Service: Endoscopy;  Laterality: N/A;   Social History:  reports that she has never smoked. She does not have any smokeless tobacco history on file. She reports that she does not drink alcohol or use illicit drugs.  Allergies  Allergen Reactions  . Contrast Media [Iodinated Diagnostic Agents] Rash  . Iohexol Nausea And Vomiting    Pt. Vomited immediately after injection of Omni 350.    . Vicodin [Hydrocodone-Acetaminophen] Rash    Family History  Problem Relation Age of Onset  . Diabetes Mother   . Diabetes Father   . Hypertension Mother   .  Hypertension Father      Prior to Admission medications   Medication Sig Start Date End Date Taking? Authorizing Provider  methimazole (TAPAZOLE) 10 MG tablet Take 10 mg by mouth daily.   Yes Historical Provider, MD  Multiple Vitamin (MULITIVITAMIN WITH MINERALS) TABS Take 1 tablet by mouth daily.   Yes Historical Provider, MD  pantoprazole (PROTONIX) 40 MG tablet Take 1 tablet (40 mg total) by mouth 2 (two) times daily before a meal. 05/28/14  Yes Radene Gunning, NP  promethazine (PHENERGAN) 25 MG tablet Take 25 mg by mouth every 6 (six) hours as needed for nausea or vomiting.   Yes Historical Provider, MD  propranolol (INDERAL) 20 MG tablet Take 20 mg by mouth 2 (two) times daily.   Yes Historical Provider, MD  ondansetron (ZOFRAN ODT) 4 MG disintegrating tablet Take 1 tablet (4 mg total) by mouth every 8 (eight) hours as needed. 05/11/14   Fredia Sorrow, MD   Physical Exam: Filed Vitals:   06/01/14 1458 06/01/14 1721 06/01/14 2023  BP: 122/84 117/74 113/66  Pulse: 100 112 113  Temp: 98.6 F (37 C) 98.6 F (37 C)   TempSrc: Oral Oral   Resp: 20 18 20   SpO2: 100% 100% 99%    Wt Readings from Last 3 Encounters:  05/23/14 58.3 kg (128 lb 8.5 oz)  05/18/14 58.968 kg (130 lb)  05/11/14 59.421 kg (131 lb)    General: Ill appearing, Pt not wanting to participate in exam.  Eyes:  PERRL, normal lids, irises & conjunctiva ENT: enlarged thyroid, dry mucus membranes Neck:  no LAD, masses or thyromegaly Cardiovascular:  RRR, no m/r/g. No LE edema. Telemetry:  SR, no arrhythmias  Respiratory:  CTA bilaterally, no w/r/r. Normal respiratory effort. Abdomen: Diffuse Abd pain. No specific point tenderness. No masses, non-distended, hypoactive BS Skin:  no rash or induration seen on limited exam Musculoskeletal:  grossly normal tone BUE/BLE Psychiatric:  grossly normal mood and affect, speech fluent and appropriate Neurologic:  grossly non-focal.          Labs on Admission:  Basic  Metabolic Panel:  Recent Labs Lab 05/26/14 0558 05/27/14 0550 05/28/14 0554 06/01/14 1533  NA 140 139 140 136  K 3.8 3.6 3.3* 3.6  CL 109 112 106 102  CO2 19 19 27 27   GLUCOSE 70 92 122* 110*  BUN 9 7 7 22   CREATININE 0.58 0.49* 0.46* 0.67  CALCIUM 9.4 9.3 9.6 10.4   Liver Function Tests:  Recent Labs Lab 06/01/14 1533  AST 24  ALT 25  ALKPHOS 54  BILITOT 0.8  PROT 7.7  ALBUMIN 3.9    Recent Labs Lab 06/01/14 1533  LIPASE 94*   No results for input(s): AMMONIA in the last 168 hours. CBC:  Recent Labs Lab 05/26/14 0558 05/27/14 0550 05/28/14 0554 06/01/14 1533  WBC 7.3 4.6 3.8* 8.1  NEUTROABS  --   --   --  5.9  HGB 10.4* 9.6* 10.8* 12.7  HCT 30.3* 28.8* 31.1* 38.2  MCV 85.6 85.5 85.0 85.5  PLT 161 158 183 261   Cardiac Enzymes:  Recent Labs Lab 06/01/14 1533  TROPONINI <0.03    BNP (last 3 results) No results for input(s): BNP in the last 8760 hours.  ProBNP (last 3 results) No results for input(s): PROBNP in the last 8760 hours.  CBG: No results for input(s): GLUCAP in the last 168 hours.  Radiological Exams on Admission: Ct Head Wo Contrast  06/01/2014   CLINICAL DATA:  48 year old female with altered mental status weakness nausea vomiting diarrhea and headache. Initial encounter.  EXAM: CT HEAD WITHOUT CONTRAST  TECHNIQUE: Contiguous axial images were obtained from the base of the skull through the vertex without intravenous contrast.  COMPARISON:  Normal noncontrast CT appearance of the brain. MRI 07/18/2011 and earlier.  FINDINGS: Visualized paranasal sinuses and mastoids are clear. No acute osseous abnormality identified. Visualized orbits and scalp soft tissues are within normal limits.  Cerebral volume is stable and within normal limits for age. No midline shift, ventriculomegaly, mass effect, evidence of mass lesion, intracranial hemorrhage or evidence of cortically based acute infarction. Gray-white matter differentiation is within  normal limits throughout the brain. No suspicious intracranial vascular hyperdensity.  IMPRESSION: Stable and normal noncontrast CT appearance of the brain.   Electronically Signed   By: Genevie Ann M.D.   On: 06/01/2014 16:16   Dg Abd Acute W/chest  06/01/2014   CLINICAL DATA:  49 year old female with weakness.  EXAM: ACUTE ABDOMEN SERIES (ABDOMEN 2 VIEW & CHEST 1 VIEW)  COMPARISON:  Chest x-ray 05/24/2014. CT of the abdomen and pelvis 05/23/2014.  FINDINGS: Lung volumes are normal. No consolidative airspace disease. No pleural effusions. No pneumothorax. No pulmonary nodule or mass noted. Pulmonary vasculature and the cardiomediastinal silhouette are within normal limits.  Gas and stool are seen scattered throughout the colon extending to the level of the distal rectum. No pathologic distension of small bowel is noted. No gross evidence of pneumoperitoneum.  IMPRESSION: 1.  Nonobstructive bowel gas pattern. 2. No pneumoperitoneum. 3. No radiographic evidence of acute cardiopulmonary disease.   Electronically Signed   By: Vinnie Langton M.D.   On: 06/01/2014 16:38    EKG: Independently reviewed. Sinus, tach, no ACS  Assessment/Plan Principal Problem:   Abdominal pain Active Problems:   Pancreatitis   Essential hypertension   Tachycardia   Dehydration   UTI (urinary tract infection)   Altered mental status   Hyperthyroidism   Chest pain  Abd pain: Recent admission s/p pyloric stenosis dilation on 05/26/14 and EGD on 05/26/14 showing mild non-erosive inflammation . Concern that thyroid dysfunction may be contributing. Pt endorses current symptoms identical to previous episode. ABD xray negative for acute process. Mild pancreatitis likely also contributing (may be elevated from recent pyloric dilation). Lacitc acid 1.87. WBC 8.1 - admit - NPO - GI consult in am.  - continue phenergan, zofran - H pylori - NS 1109ml/hr - Lipid panel - Abd US_  AMS: pt reported to be altered but mentation clear  during my exam. Pt slow to respond, and not wanting to participate during exam, which she endorses is due to pain - monitor  CP: cardiac vs PE vs MSK. Troponin neg. EKG w/o ACS.  - troponins - tele - GI cocktail - D-dimer  Tachycardia: multifactorial - dehydration and hyperthyroidism and pain - continue propranolol - IVF as above  Hyperthyroidism: TSH 0.011. Will need further workup. No indication from pt that she ever followed up with Dr Dorris Fetch.  - continue methimazole.  DYsuria: UTI on last admission. UCX at that time showing Ecoli. Treated w/ CTX x 2 days. UA contaminated sample but suspicious for infection  - Rocephin - UCX - pyridium   Code Status: FULL DVT Prophylaxis: Hep Family Communication: None Disposition Plan: pending improvement  MERRELL, DAVID Lenna Sciara, MD Family Medicine [facility] [email or web address] Password [email or web address]

## 2014-06-01 NOTE — ED Notes (Signed)
Assisted patient with bedpan. Urine specimen obtained.

## 2014-06-02 ENCOUNTER — Encounter (HOSPITAL_COMMUNITY): Payer: Self-pay

## 2014-06-02 ENCOUNTER — Inpatient Hospital Stay (HOSPITAL_COMMUNITY): Payer: 59

## 2014-06-02 DIAGNOSIS — R109 Unspecified abdominal pain: Secondary | ICD-10-CM

## 2014-06-02 DIAGNOSIS — R1110 Vomiting, unspecified: Secondary | ICD-10-CM

## 2014-06-02 DIAGNOSIS — E440 Moderate protein-calorie malnutrition: Secondary | ICD-10-CM

## 2014-06-02 DIAGNOSIS — R7989 Other specified abnormal findings of blood chemistry: Secondary | ICD-10-CM | POA: Insufficient documentation

## 2014-06-02 DIAGNOSIS — R791 Abnormal coagulation profile: Secondary | ICD-10-CM

## 2014-06-02 LAB — COMPREHENSIVE METABOLIC PANEL
ALT: 19 U/L (ref 0–35)
ANION GAP: 8 (ref 5–15)
AST: 19 U/L (ref 0–37)
Albumin: 3 g/dL — ABNORMAL LOW (ref 3.5–5.2)
Alkaline Phosphatase: 41 U/L (ref 39–117)
BUN: 18 mg/dL (ref 6–23)
CALCIUM: 9 mg/dL (ref 8.4–10.5)
CO2: 21 mmol/L (ref 19–32)
CREATININE: 0.61 mg/dL (ref 0.50–1.10)
Chloride: 109 mmol/L (ref 96–112)
GFR calc non Af Amer: >90 mL/min (ref >90)
GLUCOSE: 87 mg/dL (ref 70–99)
Potassium: 3.7 mmol/L (ref 3.5–5.1)
Sodium: 138 mmol/L (ref 135–145)
TOTAL PROTEIN: 6 g/dL (ref 6.0–8.3)
Total Bilirubin: 0.6 mg/dL (ref 0.3–1.2)

## 2014-06-02 LAB — CBC
HCT: 30 % — ABNORMAL LOW (ref 36.0–46.0)
Hemoglobin: 9.9 g/dL — ABNORMAL LOW (ref 12.0–15.0)
MCH: 28.5 pg (ref 26.0–34.0)
MCHC: 33 g/dL (ref 30.0–36.0)
MCV: 86.5 fL (ref 78.0–100.0)
PLATELETS: 210 10*3/uL (ref 150–400)
RBC: 3.47 MIL/uL — AB (ref 3.87–5.11)
RDW: 11.5 % (ref 11.5–15.5)
WBC: 5.7 10*3/uL (ref 4.0–10.5)

## 2014-06-02 LAB — T4, FREE: Free T4: 8.64 ng/dL — ABNORMAL HIGH (ref 0.80–1.80)

## 2014-06-02 LAB — LIPID PANEL
Cholesterol: 118 mg/dL (ref 0–200)
HDL: 20 mg/dL — ABNORMAL LOW (ref >39)
LDL Cholesterol: 85 mg/dL (ref 0–99)
Total CHOL/HDL Ratio: 5.9 RATIO
Triglycerides: 63 mg/dL (ref <150)
VLDL: 13 mg/dL (ref 0–40)

## 2014-06-02 LAB — LACTIC ACID, PLASMA: Lactic Acid, Venous: 0.9 mmol/L (ref 0.5–2.0)

## 2014-06-02 LAB — TROPONIN I: Troponin I: <0.03 ng/mL (ref <0.031)

## 2014-06-02 MED ORDER — SODIUM IODIDE I 131 CAPSULE
9.0000 | Freq: Once | INTRAVENOUS | Status: AC | PRN
  Administered 2014-06-02: 9 via ORAL

## 2014-06-02 MED ORDER — SODIUM CHLORIDE 0.9 % IV SOLN
INTRAVENOUS | Status: AC
  Administered 2014-06-02 – 2014-06-03 (×2): via INTRAVENOUS

## 2014-06-02 NOTE — Care Management Note (Addendum)
    Page 1 of 1   06/05/2014     2:00:56 PM CARE MANAGEMENT NOTE 06/05/2014  Patient:  [name], [name]   Account Number:  [id number]  Date Initiated:  06/02/2014  Documentation initiated by:  Theophilus Kinds  Subjective/Objective Assessment:   Pt admitted from home with nausea/vomiting. Pt lives with her husband and will return home at discharge. Pt is independent with ADL's.     Action/Plan:   Will continue to follow for discharge planning needs.   Anticipated DC Date:  06/05/2014   Anticipated DC Plan:  Central  CM consult      Choice offered to / List presented to:             Status of service:  Completed, signed off Medicare Important Message given?   (If response is "NO", the following Medicare IM given date fields will be blank) Date Medicare IM given:   Medicare IM given by:   Date Additional Medicare IM given:   Additional Medicare IM given by:    Discharge Disposition:  HOME/SELF CARE  Per UR Regulation:    If discussed at Long Length of Stay Meetings, dates discussed:    Comments:  06/05/14 [place], RN BSN CM Pt discharged home today. No CM needs noted. 06/02/14 [place], RN BSN CM

## 2014-06-02 NOTE — Progress Notes (Signed)
Still need stool sample from pt to send for GI pathogen and H. Pylori testing. Per charge nurse, no need for this pt to be on contact/enteric precautions. Will continue to monitor

## 2014-06-02 NOTE — Consult Note (Signed)
Amyre Segundo MRN: [id number] DOB/AGE: [birth year] 48 y.o. Primary Care Physician:No primary care provider on file. Admit date: 06/01/2014 Chief Complaint: consult for hyperthyroidism HPI:  49 yr old female with following medical hx  . She is being seen in consultation for hyperthyroidism diagnosed last week.  She was in house last week for intractable nausea nad voming . Workup incidentally showed thyroid hormone abnormality suggestive of hyperthyroidism.  She was discharged and I saw her in clinic same day.  I started her on Tapazole and propranolol , however , over the weekend she continued to have continuous vomiting. Her husband brought  her back to clinic on Monday , however she was too weak and dehydrated . I sent her to ER . She is admitted for same reasons. Her repeat TFT show significantly elevated free T4 and suppressed TSH. She has dealt with symptoms of heat intolerance, fatigue, sleep disturbance.s he is s/p hysterectomy. She lost an unquantified amount of weight over 3 weeks of symptoms.   Past Medical History  Diagnosis Date  . Hypertension   . Asthma   . Hyperthyroidism   . Enlarged ovary     right CT 05/2014   Past Surgical History  Procedure Laterality Date  . Abdominal hysterectomy    . Ovarian cyst surgery    . Esophagogastroduodenoscopy N/A 05/26/2014    Procedure: ESOPHAGOGASTRODUODENOSCOPY (EGD);  Surgeon: Danie Binder, MD;  [facility];  Service: Endoscopy;  Laterality: N/A;  . Balloon dilation N/A 05/26/2014    Procedure: BALLOON DILATION OF THE PYLORUS;  Surgeon: Danie Binder, MD;  Location: [facility];  Service: Endoscopy;  Laterality: N/A;     Family History  Problem Relation Age of Onset  . Diabetes Mother   . Diabetes Father   . Hypertension Mother   . Hypertension Father     Social History:  reports that she has never smoked. She does not have any smokeless tobacco history on file. She reports that she does not drink alcohol or use  illicit drugs.   Allergies:  Allergies  Allergen Reactions  . Contrast Media [Iodinated Diagnostic Agents] Rash  . Iohexol Nausea And Vomiting    Pt. Vomited immediately after injection of Omni 350.    . Vicodin [Hydrocodone-Acetaminophen] Rash    Medications Prior to Admission  Medication Sig Dispense Refill  . methimazole (TAPAZOLE) 10 MG tablet Take 10 mg by mouth daily.    . Multiple Vitamin (MULITIVITAMIN WITH MINERALS) TABS Take 1 tablet by mouth daily.    . pantoprazole (PROTONIX) 40 MG tablet Take 1 tablet (40 mg total) by mouth 2 (two) times daily before a meal. 60 tablet 0  . promethazine (PHENERGAN) 25 MG tablet Take 25 mg by mouth every 6 (six) hours as needed for nausea or vomiting.    . propranolol (INDERAL) 20 MG tablet Take 20 mg by mouth 2 (two) times daily.    . ondansetron (ZOFRAN ODT) 4 MG disintegrating tablet Take 1 tablet (4 mg total) by mouth every 8 (eight) hours as needed. 12 tablet 1       RAX:ENMMH from the symptoms mentioned above,there are no other symptoms referable to all systems reviewed.  Physical Exam: Blood pressure 126/67, pulse 102, temperature 98.7 F (37.1 C), temperature source Oral, resp. rate 20, height 5\' 4"  (1.626 m), weight 57.7 kg (127 lb 3.3 oz), SpO2 100 %.  GEN; weak, but no acute distress. HEENT: no exophthalmos Neck: no gross goiter Chest: CLTB CVS : RRR,  no murmur. She has tachycardia. Abdomen: mild BLQ tenderness Ext: no edema CNS; No gross deficit.   Recent Labs  06/01/14 1533 06/02/14 0307  WBC 8.1 5.7  NEUTROABS 5.9  --   HGB 12.7 9.9*  HCT 38.2 30.0*  MCV 85.5 86.5  PLT 261 210    Recent Labs  06/01/14 1533 06/02/14 0307  NA 136 138  K 3.6 3.7  CL 102 109  CO2 27 21  GLUCOSE 110* 87  BUN 22 18  CREATININE 0.67 0.61  CALCIUM 10.4 9.0    Recent Labs  06/01/14 1533 06/02/14 0307  AST 24 19  ALT 25 19  ALKPHOS 54 41  BILITOT 0.8 0.6  PROT 7.7 6.0  ALBUMIN 3.9 3.0*   Recent Results (from  the past 240 hour(s))  Culture, Urine     Status: None   Collection Time: 05/23/14  5:54 PM  Result Value Ref Range Status   Specimen Description URINE, RANDOM  Final   Special Requests NONE  Final   Colony Count   Final    >=100,000 COLONIES/ML Performed at [facility]    Culture   Final    Multiple bacterial morphotypes present, none predominant. Suggest appropriate recollection if clinically indicated. Performed at [facility]    Report Status 05/26/2014 FINAL  Final  Culture, blood (routine x 2)     Status: None   Collection Time: 05/24/14  7:02 AM  Result Value Ref Range Status   Specimen Description BLOOD RIGHT ANTECUBITAL  Final   Special Requests BOTTLES DRAWN AEROBIC AND ANAEROBIC 6CC  Final   Culture NO GROWTH 5 DAYS  Final   Report Status 05/29/2014 FINAL  Final  Culture, blood (routine x 2)     Status: None   Collection Time: 05/24/14  7:02 AM  Result Value Ref Range Status   Specimen Description BLOOD RIGHT HAND  Final   Special Requests BOTTLES DRAWN AEROBIC AND ANAEROBIC 6CC  Final   Culture NO GROWTH 5 DAYS  Final   Report Status 05/29/2014 FINAL  Final  Clostridium Difficile by PCR     Status: None   Collection Time: 05/27/14  7:17 PM  Result Value Ref Range Status   C difficile by pcr NEGATIVE NEGATIVE Final    Ct Abdomen Pelvis Wo Contrast  05/23/2014   CLINICAL DATA:  49 year old female abdominal pain and vomiting. Endoscopy yesterday. Initial encounter.  EXAM: CT ABDOMEN AND PELVIS WITHOUT CONTRAST  TECHNIQUE: Multidetector CT imaging of the abdomen and pelvis was performed following the standard protocol without IV contrast.  COMPARISON:  CT Abdomen and Pelvis 05/11/2014 and earlier  FINDINGS: minor lung base atelectasis. No pericardial or pleural effusion. No acute osseous abnormality identified.  Diminutive bladder. No pelvic free fluid. Uterus surgically absent. Stable right ovary.  No large bowel inflammation identified. Oral contrast has  reached the terminal ileum. Appendix not identified today. No dilated small bowel. Negative stomach. No definite duodenum inflammation.  Noncontrast liver, gallbladder, spleen pancreas, adrenal glands, and kidneys appear stable and within normal limits. No abdominal free fluid or free air.  IMPRESSION: No definite acute or inflammatory process identified in the abdomen or pelvis in the absence of IV contrast.   Electronically Signed   By: Genevie Ann M.D.   On: 05/23/2014 17:13   Ct Abdomen Pelvis Wo Contrast  05/11/2014   CLINICAL DATA:  Persistent abdominal pain for approximately 13 days with vomiting.  EXAM: CT ABDOMEN AND PELVIS WITHOUT CONTRAST  TECHNIQUE: Multidetector CT imaging of the abdomen and pelvis was performed following the standard protocol without IV contrast. (Patient has a IV contrast allergy)  COMPARISON:  07/23/2010  FINDINGS: Lower chest: Clear lung bases. Normal heart size. No pericardial or pleural effusion.  Abdomen: The kidneys demonstrate no acute hydronephrosis, perinephric inflammation, or obstructing ureteral calculus on either side.  The liver, gallbladder, biliary system, pancreas, spleen, and adrenal glands are within normal limits for age and noncontrast imaging.  Slight wall prominence of the distal stomach and the duodenum, difficult to exclude mild gastritis/ duodenitis.  No associated bowel obstruction, dilatation, ileus, or free air.  Pelvis: Prior hysterectomy. No pelvic free fluid, fluid collection, hemorrhage, abscess, adenopathy, inguinal abnormality, or hernia. Urinary bladder unremarkable. Pelvic calcifications consistent with venous phleboliths. No acute distal bowel process. Portions of the appendix are demonstrated across the right iliac vessels, images 59 and 60 without evidence of appendicitis. Right lower quadrant lobulated soft tissue overlies the right iliopsoas muscle, image 60. This measures 5.0 x 2.1 cm, image 60 and correlates with an enlarged right ovary when  compared to prior contrast exam.  No acute osseous finding.  IMPRESSION: Distal stomach and diffuse duodenal slightly wall prominence, nonspecific but could represent mild gastritis/ duodenitis. No associated bowel obstruction, abscess or free air.  No acute obstructing urinary tract calculus or hydronephrosis.  Normal appendix.  Slightly smaller but still enlarged right ovary measuring 5 x 2.1 cm. This remains nonspecific. Consider follow-up nonemergent pelvic ultrasound.   Electronically Signed   By: Daryll Brod M.D.   On: 05/11/2014 16:55   Dg Chest 2 View  05/11/2014   CLINICAL DATA:  Nausea, vomiting, diarrhea and headache.  EXAM: CHEST - 2 VIEW  COMPARISON:  CTA of the chest on 04/30/2011  FINDINGS: The heart size and mediastinal contours are within normal limits. There is no evidence of pulmonary edema, consolidation, pneumothorax, nodule or pleural fluid. The visualized skeletal structures are unremarkable.  IMPRESSION: No active disease.   Electronically Signed   By: Aletta Edouard M.D.   On: 05/11/2014 16:30   Ct Head Wo Contrast  06/01/2014   CLINICAL DATA:  49 year old female with altered mental status weakness nausea vomiting diarrhea and headache. Initial encounter.  EXAM: CT HEAD WITHOUT CONTRAST  TECHNIQUE: Contiguous axial images were obtained from the base of the skull through the vertex without intravenous contrast.  COMPARISON:  Normal noncontrast CT appearance of the brain. MRI 07/18/2011 and earlier.  FINDINGS: Visualized paranasal sinuses and mastoids are clear. No acute osseous abnormality identified. Visualized orbits and scalp soft tissues are within normal limits.  Cerebral volume is stable and within normal limits for age. No midline shift, ventriculomegaly, mass effect, evidence of mass lesion, intracranial hemorrhage or evidence of cortically based acute infarction. Gray-white matter differentiation is within normal limits throughout the brain. No suspicious intracranial  vascular hyperdensity.  IMPRESSION: Stable and normal noncontrast CT appearance of the brain.   Electronically Signed   By: Genevie Ann M.D.   On: 06/01/2014 16:16   US Abdomen Limited  05/23/2014   CLINICAL DATA:  49 year old female with subacute right upper quadrant pain, vomiting and weight loss. Initial encounter.  EXAM: US ABDOMEN LIMITED - RIGHT UPPER QUADRANT  COMPARISON:  05/11/2014 and prior CTs.  FINDINGS: Gallbladder:  The gallbladder is unremarkable. There is no evidence of cholelithiasis or acute cholecystitis.  Common bile duct:  Diameter: 3.0 mm. There is no evidence of intrahepatic or extrahepatic biliary dilatation.  Liver:  No  focal lesion identified. Within normal limits in parenchymal echogenicity.  There is no evidence of ascites within the right upper abdomen.  IMPRESSION: Unremarkable right upper quadrant abdominal ultrasound.   Electronically Signed   By: Margarette Canada M.D.   On: 05/23/2014 13:21   Dg Chest Port 1 View  05/24/2014   CLINICAL DATA:  Feeling very weak para nausea and vomiting, diarrhea for 3 weeks.  EXAM: PORTABLE CHEST - 1 VIEW  COMPARISON:  05/11/2014  FINDINGS: Heart size is normal. Lungs are clear. No pulmonary edema. Visualized osseous structures have a normal appearance.  IMPRESSION: No active disease.   Electronically Signed   By: Nolon Nations M.D.   On: 05/24/2014 15:08   Dg Abd Acute W/chest  06/01/2014   CLINICAL DATA:  49 year old female with weakness.  EXAM: ACUTE ABDOMEN SERIES (ABDOMEN 2 VIEW & CHEST 1 VIEW)  COMPARISON:  Chest x-ray 05/24/2014. CT of the abdomen and pelvis 05/23/2014.  FINDINGS: Lung volumes are normal. No consolidative airspace disease. No pleural effusions. No pneumothorax. No pulmonary nodule or mass noted. Pulmonary vasculature and the cardiomediastinal silhouette are within normal limits.  Gas and stool are seen scattered throughout the colon extending to the level of the distal rectum. No pathologic distension of small bowel is  noted. No gross evidence of pneumoperitoneum.  IMPRESSION: 1.  Nonobstructive bowel gas pattern. 2. No pneumoperitoneum. 3. No radiographic evidence of acute cardiopulmonary disease.   Electronically Signed   By: Vinnie Langton M.D.   On: 06/01/2014 16:38     Results for GEOFFREY, MANKIN (MRN [id number]) as of 06/02/2014 12:38  Ref. Range 06/01/2014 15:33  TSH Latest Range: 0.350-4.500 uIU/mL 0.011 (L)  Free T4 Latest Range: 0.80-1.80 ng/dL 8.64 (H)    Impression:  Principal Problem:   Abdominal pain Active Problems:   Pancreatitis   Essential hypertension   Tachycardia   Dehydration   UTI (urinary tract infection)   Altered mental status   Hyperthyroidism   Chest pain   Plan: Her clinical presentation and labs are suggestive of hyperthyroidism which will need definitive therapy. I have communicated with radiology, and verified the fact that she did not receive contrast study last week. Hence, she will have  Confirmatory thyroid uptake and scan in preparation for RAI ablation if hyperthyroidism is confirmed. She will hold methimazole. Her nausea and vomiting is out of proportion for hyperthyroidism, and may need further  work up.  I will continue to follow up.   Loni Beckwith, MD 06/02/2014, 12:27 PM

## 2014-06-02 NOTE — Progress Notes (Signed)
UR chart review completed.  

## 2014-06-02 NOTE — Progress Notes (Signed)
INITIAL NUTRITION ASSESSMENT Pt meets criteria for MODERATE MALNUTRITION in the context of Acute illness as evidenced by loss of 7.#% bw in 2 months and energy intake of <75% compared to needs for >7 days.   DOCUMENTATION CODES Per approved criteria  -Non-severe (moderate) malnutrition in the context of acute illness or injury   INTERVENTION: -monitor for diet advancement and add supplements/snacks as warranted  NUTRITION DIAGNOSIS: Inadequate oral intake related to severe nausea/vomiting as evidenced by loss of ~4 pounds this month.    Goals: Diet advanced within 2-3 days  Pt to meet >/= 90% of their estimated nutrition needs   Monitor:  Diet status, oral intake, procedures/test results, labs, GI distress  Reason for Assessment: MST of 2  49 y.o. female  Admitting Dx: Abdominal pain  ASSESSMENT: 49 y.o. femalewith 2 day history of nausea and vomiting. Associated with epigastric pain. Comes and goes. Worse with food. Patient has been seen in the ED 3 times for nausea and vomiting  Pt was here 1 week ago d/t severe nausea/vomiting. Patient has been readmitted with no change in symptoms. Pt was taking resource breeze TID last admission, but does not want to start again. Pt wanted to try to eat some juice, eggs, coffee, and cranberry juice, but diet is currently NPO.   Pt has lost 7.2% of wt in last 2 months. Can be assumed to be significant  Height: Ht Readings from Last 1 Encounters:  06/01/14 5\' 4"  (1.626 m)    Weight: Wt Readings from Last 1 Encounters:  06/01/14 127 lb 3.3 oz (57.7 kg)    Ideal Body Weight: 120 lbs  % Ideal Body Weight: 106%  Wt Readings from Last 10 Encounters:  06/01/14 127 lb 3.3 oz (57.7 kg)  05/23/14 128 lb 8.5 oz (58.3 kg)  05/18/14 130 lb (58.968 kg)  05/11/14 131 lb (59.421 kg)  03/31/14 137 lb (62.143 kg)  04/30/11 125 lb (56.7 kg)   BMI:  Body mass index is 21.82 kg/(m^2).  Estimated Nutritional Needs: Kcal:1750-2000 (30-35  kcal/kg) Protein: 87-98 (1.5-1.7 g/kg) Fluid: 1.7-2 liters  Skin: WDL  Diet Order: Diet NPO time specified  EDUCATION NEEDS: -No education needs identified at this time   Intake/Output Summary (Last 24 hours) at 06/02/14 0848 Last data filed at 06/02/14 0818  Gross per 24 hour  Intake      0 ml  Output    200 ml  Net   -200 ml    Last BM: 2/21   Labs:   Recent Labs Lab 05/28/14 0554 06/01/14 1533 06/02/14 0307  NA 140 136 138  K 3.3* 3.6 3.7  CL 106 102 109  CO2 27 27 21   BUN 7 22 18   CREATININE 0.46* 0.67 0.61  CALCIUM 9.6 10.4 9.0  GLUCOSE 122* 110* 87    CBG (last 3)  No results for input(s): GLUCAP in the last 72 hours.  Scheduled Meds: . sodium chloride   Intravenous STAT  . cefTRIAXone (ROCEPHIN)  IV  1 g Intravenous Q24H  . heparin  5,000 Units Subcutaneous 3 times per day  . methimazole  10 mg Oral Daily  . pantoprazole  40 mg Oral BID AC  . phenazopyridine  100 mg Oral TID WC  . propranolol  20 mg Oral BID  . senna  1 tablet Oral BID  . sodium chloride  3 mL Intravenous Q12H    Continuous Infusions: . sodium chloride 125 mL/hr at 06/01/14 2259    Past Medical History  Diagnosis Date  . Hypertension   . Asthma   . Hyperthyroidism   . Enlarged ovary     right CT 05/2014    Past Surgical History  Procedure Laterality Date  . Abdominal hysterectomy    . Ovarian cyst surgery    . Esophagogastroduodenoscopy N/A 05/26/2014    Procedure: ESOPHAGOGASTRODUODENOSCOPY (EGD);  Surgeon: Danie Binder, MD;  Location: [facility];  Service: Endoscopy;  Laterality: N/A;  . Balloon dilation N/A 05/26/2014    Procedure: BALLOON DILATION OF THE PYLORUS;  Surgeon: Danie Binder, MD;  Location: [facility];  Service: Endoscopy;  Laterality: N/A;    Burtis Junes RD, LDN Nutrition Pager: [phone] 06/02/2014 8:48 AM

## 2014-06-02 NOTE — Progress Notes (Signed)
Patient ID: [name], female   DOB: 05-31-1965, 49 y.o.   MRN: [id number] [facility] PROGRESS NOTE  [name] IFO:[id number] DOB: 08/30/1965 DOA: 06/01/2014 PCP: No primary care provider on file.  Brief narrative:    49 y.o. female with history of hyperthyroidism, hypertension, GERD who presented to [facility] ED with complaints of ongoing nausea, vomiting and epigastric pain for few days prior to this admission. She also reported intermittent chest pain with mild shortness of breath. Patient also had reports of dysuria on admission.  Patient was hemodynamically stable on admission. Blood work showed normal troponin level, normal renal function. UA showed large leukocytes and many bacteria. Pt was started on empiric rocephin.  In addition, TSH level was low at 0.011 and T4 high at 8.6 suggestive of hyperthyroidism for which reason endocrinology was consulted.   Assessment/Plan:    Principal Problem: Abdominal pain, nausea and vomiting / low TSH and elevated T4 / hyperthyroidism - signs and symptoms suggestive of hyperthyroidism; differential also includes pancreatitis  - TSH is low at 0.011, T4 is 8.64 - appreciate endocrinology input; plan for RAI while pt in hospital - UDS and ethanol WNL -continue supportive care with IV fluids, analgesia and antiemetics as needed   Active Problems: Acute Pancreatitis - Lipase level was 94 on admission - continue NPO - continue IV fluids, analgesia and antiemetics as needed - abdominal x ray showed no obstruction; abdominal ultrasound showed no gallstones or liver abnormalities.   Elevated D dimer - needs perfusion scan (has contrast allergy) to rule out PE  UTI (urinary tract infection) - UA on admission shoed large leukocytes - started on rocephin IV daily - follow up urine culture results  Malnutrition of moderate degree - nutrition consulted    DVT Prophylaxis  - SCD's bilaterally    Code Status: Full.  Family Communication:   plan of care discussed with the patient Disposition Plan: Home when stable.   IV access:  Peripheral IV  Procedures and diagnostic studies:    Ct Head Wo Contrast 06/01/2014 Stable and normal noncontrast CT appearance of the brain.   US Abdomen Complete 06/02/2014  1. No gallstones. 2. No abnormality of the liver is seen. 3. The pancreas is largely obscured by bowel gas.     Dg Abd Acute W/chest 06/01/2014  1.  Nonobstructive bowel gas pattern. 2. No pneumoperitoneum. 3. No radiographic evidence of acute cardiopulmonary disease.    Medical Consultants:  Endocrinology  Other Consultants:  None   IAnti-Infectives:   None    Leisa Lenz, MD  [facility] Pager [phone]  If 7PM-7AM, please contact night-coverage [email or web address] Password [facility] 06/02/2014, 2:14 PM   LOS: 1 day    HPI/Subjective: No acute overnight events.  Objective: Filed Vitals:   06/01/14 2132 06/02/14 0440 06/02/14 0825 06/02/14 1100  BP: 106/52 98/52 111/74 126/67  Pulse: 111 96 104 102  Temp: 99.2 F (37.3 C) 98.7 F (37.1 C)    TempSrc: Oral Oral    Resp: 20 20    Height: 5\' 4"  (1.626 m)     Weight: 57.7 kg (127 lb 3.3 oz)     SpO2: 100% 100%      Intake/Output Summary (Last 24 hours) at 06/02/14 1414 Last data filed at 06/02/14 0818  Gross per 24 hour  Intake      0 ml  Output    200 ml  Net   -200 ml    Exam:   General:  Pt is alert, follows commands  appropriately, not in acute distress  Cardiovascular: Regular rate and rhythm, S1/S2, no murmurs  Respiratory: Clear to auscultation bilaterally, no wheezing, no crackles, no rhonchi  Abdomen: Soft, non tender, non distended, bowel sounds present  Extremities: No edema, pulses DP and PT palpable bilaterally  Neuro: Grossly nonfocal  Data Reviewed: Basic Metabolic Panel:  Recent Labs Lab 05/27/14 0550 05/28/14 0554 06/01/14 1533 06/02/14 0307  NA 139 140 136 138  K 3.6 3.3* 3.6 3.7  CL 112 106 102 109  CO2 19 27 27  21   GLUCOSE 92 122* 110* 87  BUN 7 7 22 18   CREATININE 0.49* 0.46* 0.67 0.61  CALCIUM 9.3 9.6 10.4 9.0   Liver Function Tests:  Recent Labs Lab 06/01/14 1533 06/02/14 0307  AST 24 19  ALT 25 19  ALKPHOS 54 41  BILITOT 0.8 0.6  PROT 7.7 6.0  ALBUMIN 3.9 3.0*    Recent Labs Lab 06/01/14 1533  LIPASE 94*   No results for input(s): AMMONIA in the last 168 hours. CBC:  Recent Labs Lab 05/27/14 0550 05/28/14 0554 06/01/14 1533 06/02/14 0307  WBC 4.6 3.8* 8.1 5.7  NEUTROABS  --   --  5.9  --   HGB 9.6* 10.8* 12.7 9.9*  HCT 28.8* 31.1* 38.2 30.0*  MCV 85.5 85.0 85.5 86.5  PLT 158 183 261 210   Cardiac Enzymes:  Recent Labs Lab 06/01/14 1533 06/01/14 2132 06/02/14 0307  TROPONINI <0.03 <0.03 <0.03   BNP: Invalid input(s): POCBNP CBG: No results for input(s): GLUCAP in the last 168 hours.  Culture, Urine     Status: None   Collection Time: 05/23/14  5:54 PM  Result Value Ref Range Status   Specimen Description URINE, RANDOM  Final   Special Requests NONE  Final   Colony Count   Final    >=100,000 COLONIES/ML Performed at [facility]    Culture   Final    Multiple bacterial morphotypes present, none predominant. Suggest appropriate recollection if clinically indicated. Performed at [facility]    Report Status 05/26/2014 FINAL  Final  Culture, blood (routine x 2)     Status: None   Collection Time: 05/24/14  7:02 AM  Result Value Ref Range Status   Specimen Description BLOOD RIGHT ANTECUBITAL  Final   Culture NO GROWTH 5 DAYS  Final   Report Status 05/29/2014 FINAL  Final  Culture, blood (routine x 2)     Status: None   Collection Time: 05/24/14  7:02 AM  Result Value Ref Range Status   Specimen Description BLOOD RIGHT HAND  Final   Culture NO GROWTH 5 DAYS  Final   Report Status 05/29/2014 FINAL  Final  Clostridium Difficile by PCR     Status: None   Collection Time: 05/27/14  7:17 PM  Result Value Ref Range Status   C  difficile by pcr NEGATIVE NEGATIVE Final     Scheduled Meds: . sodium chloride   Intravenous STAT  . cefTRIAXone (ROCEPHIN)  IV  1 g Intravenous Q24H  . pantoprazole  40 mg Oral BID AC  . phenazopyridine  100 mg Oral TID WC  . propranolol  20 mg Oral BID  . senna  1 tablet Oral BID  . sodium chloride  3 mL Intravenous Q12H   Continuous Infusions: . sodium chloride 125 mL/hr at 06/01/14 2259

## 2014-06-03 ENCOUNTER — Inpatient Hospital Stay (HOSPITAL_COMMUNITY): Payer: 59

## 2014-06-03 LAB — BASIC METABOLIC PANEL
Anion gap: 6 (ref 5–15)
BUN: 8 mg/dL (ref 6–23)
CHLORIDE: 106 mmol/L (ref 96–112)
CO2: 26 mmol/L (ref 19–32)
Calcium: 9.2 mg/dL (ref 8.4–10.5)
Creatinine, Ser: 0.42 mg/dL — ABNORMAL LOW (ref 0.50–1.10)
GFR calc non Af Amer: >90 mL/min (ref >90)
Glucose, Bld: 94 mg/dL (ref 70–99)
Potassium: 3.2 mmol/L — ABNORMAL LOW (ref 3.5–5.1)
Sodium: 138 mmol/L (ref 135–145)

## 2014-06-03 LAB — CBC
HEMATOCRIT: 28.1 % — AB (ref 36.0–46.0)
HEMOGLOBIN: 9.3 g/dL — AB (ref 12.0–15.0)
MCH: 28.3 pg (ref 26.0–34.0)
MCHC: 33.1 g/dL (ref 30.0–36.0)
MCV: 85.4 fL (ref 78.0–100.0)
Platelets: 197 10*3/uL (ref 150–400)
RBC: 3.29 MIL/uL — AB (ref 3.87–5.11)
RDW: 11.2 % — ABNORMAL LOW (ref 11.5–15.5)
WBC: 6 10*3/uL (ref 4.0–10.5)

## 2014-06-03 MED ORDER — SODIUM CHLORIDE 0.9 % IJ SOLN
INTRAMUSCULAR | Status: AC
  Filled 2014-06-03: qty 36

## 2014-06-03 MED ORDER — METOCLOPRAMIDE HCL 5 MG/ML IJ SOLN
5.0000 mg | Freq: Four times a day (QID) | INTRAMUSCULAR | Status: DC
  Administered 2014-06-03 – 2014-06-05 (×6): 5 mg via INTRAVENOUS
  Filled 2014-06-03 (×7): qty 2

## 2014-06-03 MED ORDER — POTASSIUM CHLORIDE 10 MEQ/100ML IV SOLN
10.0000 meq | INTRAVENOUS | Status: AC
  Administered 2014-06-03 (×5): 10 meq via INTRAVENOUS
  Filled 2014-06-03: qty 100

## 2014-06-03 MED ORDER — SODIUM PERTECHNETATE TC 99M INJECTION
10.0000 | Freq: Once | INTRAVENOUS | Status: AC | PRN
Start: 2014-06-03 — End: 2014-06-03
  Administered 2014-06-03: 10 via INTRAVENOUS

## 2014-06-03 MED ORDER — POLYETHYLENE GLYCOL 3350 17 G PO PACK
17.0000 g | PACK | Freq: Every day | ORAL | Status: DC
  Filled 2014-06-03: qty 1

## 2014-06-03 NOTE — Progress Notes (Signed)
Progress Note   [name] GYJ:[id number] DOB: 09-24-1965 DOA: 06/01/2014 PCP: No primary care provider on file.   Brief Narrative:   [name] is an 49 y.o. female with a PMH of hyperthyroidism, hypertension, GERD who was admitted 06/01/14 with complaints of a several day history of ongoing nausea, vomiting and epigastric pain. She also reported intermittent chest pain with mild shortness of breath and dysuria on admission. Patient was hemodynamically stable on admission. Blood work showed normal troponin level, normal renal function. UA showed large leukocytes and many bacteria. Pt was started on empiric rocephin. In addition, TSH level was low at 0.011 and T4 high at 8.6 suggestive of hyperthyroidism, so endocrinology was consulted.  Assessment/Plan:   Principal Problem: Abdominal pain, nausea and vomiting / low TSH and elevated T4 / hyperthyroidism - Signs and symptoms suggestive of hyperthyroidism; differential also includes pancreatitis.  - TSH low at 0.011, T4 is 8.64. - Status post endocrinology consultation with plans for RAI ablation. - Continue supportive care with IV fluids, analgesia and antiemetics as needed. - F/U H. Pylori screen. - Add Reglan.  Active Problems: Hypokalemia - K runs x 5 ordered.  Secondary to GI losses.  Acute Pancreatitis - Lipase level was 94 on admission. - Continue CL diet today. - Continue IV fluids, analgesia and antiemetics as needed. - Abdominal x ray showed no obstruction; abdominal ultrasound showed no gallstones or liver abnormalities.   Elevated D dimer - VQ scan ordered to R/O PE.  Cancel.  Patient not dyspneic, tachycardia explained by hyperthyroidism.  No chest pain.  UTI (urinary tract infection) - UA on admission showed large leukocytes. - Continue rocephin IV daily. - Follow up urine culture results.  Malnutrition of moderate degree - Evaluated by dietician 06/02/24.   DVT Prophylaxis  - Continue SCDs.   Code  Status: Full.  Family Communication: Plan of care discussed with the patient Disposition Plan: Home when stable.   IV Access:    Peripheral IV   Procedures and diagnostic studies:   Ct Head Wo Contrast 06/01/2014 Stable and normal noncontrast CT appearance of the brain.   Dg Abd Acute W/chest 06/01/2014 1. Nonobstructive bowel gas pattern. 2. No pneumoperitoneum. 3. No radiographic evidence of acute cardiopulmonary disease.   US Abdomen Complete 06/02/2014 1. No gallstones. 2. No abnormality of the liver is seen. 3. The pancreas is largely obscured by bowel gas.    Medical Consultants:    Cassandria Anger, MD, Endocrinology  Anti-Infectives:    Rocephin 06/01/14--->  Subjective:   [name] still has nausea but has not vomited in the past 2 days.  Ate OK yesterday, but no appetite today.  No abdominal pain.  No dyspnea, occasional dry cough.  Last BM was this morning.  No diarrhea, but reports some constipation with straining.  Objective:    Filed Vitals:   06/02/14 1100 06/02/14 1526 06/02/14 2053 06/03/14 0602  BP: 126/67 136/66 141/77 119/59  Pulse: 102 100 94 92  Temp:  99.2 F (37.3 C) 99.2 F (37.3 C) 99 F (37.2 C)  TempSrc:   Oral Oral  Resp:  20 20 20   Height:      Weight:      SpO2:  100% 100% 100%    Intake/Output Summary (Last 24 hours) at 06/03/14 0759 Last data filed at 06/03/14 0327  Gross per 24 hour  Intake   1275 ml  Output   1300 ml  Net    -25 ml  Exam: Gen:  NAD Psych: Depressed affect Cardiovascular:  Tachy/reg, No M/R/G Respiratory:  Lungs CTAB Gastrointestinal:  Abdomen soft, NT/ND, + BS Extremities:  No C/E/C   Data Reviewed:    Labs: Basic Metabolic Panel:  Recent Labs Lab 05/28/14 0554 06/01/14 1533 06/02/14 0307 06/03/14 0552  NA 140 136 138 138  K 3.3* 3.6 3.7 3.2*  CL 106 102 109 106  CO2 27 27 21 26   GLUCOSE 122* 110* 87 94  BUN 7 22 18 8   CREATININE 0.46* 0.67 0.61 0.42*  CALCIUM 9.6  10.4 9.0 9.2   GFR Estimated Creatinine Clearance: 75.1 mL/min (by C-G formula based on Cr of 0.42). Liver Function Tests:  Recent Labs Lab 06/01/14 1533 06/02/14 0307  AST 24 19  ALT 25 19  ALKPHOS 54 41  BILITOT 0.8 0.6  PROT 7.7 6.0  ALBUMIN 3.9 3.0*    Recent Labs Lab 06/01/14 1533  LIPASE 94*   CBC:  Recent Labs Lab 05/28/14 0554 06/01/14 1533 06/02/14 0307 06/03/14 0552  WBC 3.8* 8.1 5.7 6.0  NEUTROABS  --  5.9  --   --   HGB 10.8* 12.7 9.9* 9.3*  HCT 31.1* 38.2 30.0* 28.1*  MCV 85.0 85.5 86.5 85.4  PLT 183 261 210 197   Cardiac Enzymes:  Recent Labs Lab 06/01/14 1533 06/01/14 2132 06/02/14 0307  TROPONINI <0.03 <0.03 <0.03   D-Dimer:  Recent Labs  06/01/14 2132  DDIMER 1.44*   Lipid Profile:  Recent Labs  06/02/14 0307  CHOL 118  HDL 20*  LDLCALC 85  TRIG 63  CHOLHDL 5.9   Thyroid function studies:  Recent Labs  06/01/14 1533  TSH 0.011*   Sepsis Labs:  Recent Labs Lab 05/28/14 0554 06/01/14 1530 06/01/14 1533 06/01/14 2132 06/01/14 2342 06/02/14 0307 06/03/14 0552  WBC 3.8*  --  8.1  --   --  5.7 6.0  LATICACIDVEN  --  1.87  --  1.1 0.9  --   --    Microbiology Recent Results (from the past 240 hour(s))  Clostridium Difficile by PCR     Status: None   Collection Time: 05/27/14  7:17 PM  Result Value Ref Range Status   C difficile by pcr NEGATIVE NEGATIVE Final     Medications:   . cefTRIAXone (ROCEPHIN)  IV  1 g Intravenous Q24H  . pantoprazole  40 mg Oral BID AC  . phenazopyridine  100 mg Oral TID WC  . propranolol  20 mg Oral BID  . senna  1 tablet Oral BID  . sodium chloride  3 mL Intravenous Q12H   Continuous Infusions: . sodium chloride 75 mL/hr at 06/03/14 0327    Time spent: 35 minutes with > 50% of time discussing current diagnostic test results, clinical impression and plan of care.   LOS: 2 days   RAMA,CHRISTINA  [facility] Pager [phone]. If unable to reach me by pager, please  call my cell phone at [phone].  *Please refer to [email or web address], password [email or web address] to get updated schedule on who will Dawson on this patient, as hospitalists switch teams weekly. If 7PM-7AM, please contact night-coverage at [email or web address], password [email or web address] for any overnight needs.  06/03/2014, 7:59 AM

## 2014-06-03 NOTE — Progress Notes (Signed)
Subjective: F/U for hyperthyroidism. She underwent thyroid uptake and scan confirming hyperthyroidism. She continued to have nausea and vomiting.  Objective: Vital signs in last 24 hours:  Filed Vitals:   06/02/14 1100 06/02/14 1526 06/02/14 2053 06/03/14 0602  BP: 126/67 136/66 141/77 119/59  Pulse: 102 100 94 92  Temp:  99.2 F (37.3 C) 99.2 F (37.3 C) 99 F (37.2 C)  TempSrc:   Oral Oral  Resp:  20 20 20   Height:      Weight:      SpO2:  100% 100% 100%    Intake/Output Summary (Last 24 hours) at 06/03/14 1610 Last data filed at 06/03/14 1400  Gross per 24 hour  Intake   1235 ml  Output   1402 ml  Net   -167 ml   Weight change:   GEN; weak, but no acute distress. HEENT: no exophthalmos Neck: no gross goiter Chest: CLTB CVS : RRR, no murmur. She has tachycardia. Abdomen: mild BLQ tenderness Ext: no edema CNS; No gross deficit.  Lab Results:  Basic Metabolic Panel:  Recent Labs  06/02/14 0307 06/03/14 0552  NA 138 138  K 3.7 3.2*  CL 109 106  CO2 21 26  GLUCOSE 87 94  BUN 18 8  CREATININE 0.61 0.42*  CALCIUM 9.0 9.2    Liver Function Tests:   Recent Labs  06/01/14 1533 06/02/14 0307  AST 24 19  ALT 25 19  ALKPHOS 54 41  BILITOT 0.8 0.6  PROT 7.7 6.0  ALBUMIN 3.9 3.0*    Recent Labs  06/01/14 1533  LIPASE 94*    CBC:  Recent Labs  06/01/14 1533 06/02/14 0307 06/03/14 0552  WBC 8.1 5.7 6.0  NEUTROABS 5.9  --   --   HGB 12.7 9.9* 9.3*  HCT 38.2 30.0* 28.1*  MCV 85.5 86.5 85.4  PLT 261 210 197    Cardiac Enzymes:  Recent Labs  06/01/14 1533 06/01/14 2132 06/02/14 0307  TROPONINI <0.03 <0.03 <0.03     Fasting Lipid Panel:  Recent Labs  06/02/14 0307  CHOL 118  HDL 20*  LDLCALC 85  TRIG 63  CHOLHDL 5.9    Thyroid Function Tests:  Recent Labs  06/01/14 1533  TSH 0.011*     Studies/Results: Nm Thyroid Sng Uptake W/imaging  06/03/2014   CLINICAL DATA:  Hyperthyroidism without crisis  EXAM: THYROID  SCAN AND UPTAKE - 24 HOURS  TECHNIQUE: Following the per oral administration of I-131 sodium iodide, the patient returned at 24 hours and uptake measurements were acquired with the uptake probe centered on the neck. Thyroid imaging was performed following the intravenous administration of the Tc-48m Pertechnetate.  RADIOPHARMACEUTICALS:  9 microCuries I-131 Sodium Iodide and 10 mCi TC-20m Pertechnetate  COMPARISON:  None  FINDINGS: 24 hour radio iodine uptake is calculated at 45%, above the normal range consistent with hyperthyroidism.  Imaging the thyroid gland in 3 projections demonstrates homogeneous tracer distribution throughout both lobes with a cold nodule at the inferior pole of the RIGHT lobe.  IMPRESSION: Elevated 24 hour radio iodine uptake of 45% consistent with hyperthyroidism.  Cold nodule at inferior pole RIGHT lobe; ultrasound assessment recommended prior to consideration of radioactive iodine ablation.   Electronically Signed   By: Lavonia Dana M.D.   On: 06/03/2014 13:13   US Abdomen Complete  06/02/2014   CLINICAL DATA:  History of pancreatitis, epigastric pain, some nausea  EXAM: ULTRASOUND ABDOMEN COMPLETE  COMPARISON:  Acute abdomen films of 06/01/2014 and CT  abdomen pelvis of 05/23/2014  FINDINGS: Gallbladder: The gallbladder is visualized and no gallstones are noted. There is no pain over the gallbladder with compression  Common bile duct: Diameter: The common bile duct is normal measuring 4.7 mm.  Liver: The liver has a normal echogenic pattern. No focal hepatic abnormality is seen.  IVC: No abnormality visualized.  Pancreas: The pancreas is largely obscured by bowel gas and cannot be evaluated.  Spleen: The spleen is normal measuring 4.9 cm.  Right Kidney: Length: 11.3 cm.  No hydronephrosis is seen.  Left Kidney: Length: 11.0 cm.  No hydronephrosis is seen.  Abdominal aorta: The abdominal aorta is normal in caliber.  Other findings: None.  IMPRESSION: 1. No gallstones. 2. No abnormality  of the liver is seen. 3. The pancreas is largely obscured by bowel gas.   Electronically Signed   By: Ivar Drape M.D.   On: 06/02/2014 14:05   US Pelvis Complete  06/03/2014   CLINICAL DATA:  Right ovarian cyst per CT.  EXAM: TRANSABDOMINAL ULTRASOUND OF PELVIS  TECHNIQUE: Transabdominal ultrasound examination of the pelvis was performed including evaluation of the uterus, ovaries, adnexal regions, and pelvic cul-de-sac.  COMPARISON:  CT 05/23/2014  FINDINGS: Uterus  Measurements: Prior hysterectomy.  Endometrium  Thickness: N/A.  Right ovary  Measurements: 4.6 x 2.3 x 1.6 cm. Normal appearance/no adnexal mass.  Left ovary  Measurements: Not visualized.  Other findings: No free fluid. Patient refused transvaginal imaging.  IMPRESSION: No adnexal masses seen.  Left ovary not visualized.  Prior hysterectomy.   Electronically Signed   By: Rolm Baptise M.D.   On: 06/03/2014 15:35   Dg Abd Acute W/chest  06/01/2014   CLINICAL DATA:  49 year old female with weakness.  EXAM: ACUTE ABDOMEN SERIES (ABDOMEN 2 VIEW & CHEST 1 VIEW)  COMPARISON:  Chest x-ray 05/24/2014. CT of the abdomen and pelvis 05/23/2014.  FINDINGS: Lung volumes are normal. No consolidative airspace disease. No pleural effusions. No pneumothorax. No pulmonary nodule or mass noted. Pulmonary vasculature and the cardiomediastinal silhouette are within normal limits.  Gas and stool are seen scattered throughout the colon extending to the level of the distal rectum. No pathologic distension of small bowel is noted. No gross evidence of pneumoperitoneum.  IMPRESSION: 1.  Nonobstructive bowel gas pattern. 2. No pneumoperitoneum. 3. No radiographic evidence of acute cardiopulmonary disease.   Electronically Signed   By: Vinnie Langton M.D.   On: 06/01/2014 16:38   Thyroid uptake and scan:   Elevated 24 hour radio iodine uptake of 45% consistent with hyperthyroidism.  Cold nodule at inferior pole RIGHT lobe; ultrasound assessment recommended prior  to consideration of radioactive iodine ablation.  Medications:  Scheduled Meds: . cefTRIAXone (ROCEPHIN)  IV  1 g Intravenous Q24H  . metoCLOPramide (REGLAN) injection  5 mg Intravenous 4 times per day  . pantoprazole  40 mg Oral BID AC  . phenazopyridine  100 mg Oral TID WC  . polyethylene glycol  17 g Oral Daily  . propranolol  20 mg Oral BID  . senna  1 tablet Oral BID  . sodium chloride  3 mL Intravenous Q12H  . sodium chloride       Continuous Infusions:  PRN Meds:.acetaminophen **OR** acetaminophen, gi cocktail, morphine injection, ondansetron **OR** ondansetron (ZOFRAN) IV, promethazine  Assessment/Plan: Principal Problem:   Abdominal pain Active Problems:   Pancreatitis   Essential hypertension   Tachycardia   Dehydration   UTI (urinary tract infection)   Altered mental status  Hyperthyroidism   Chest pain   Malnutrition of moderate degree   Elevated d-dimer  Her labs and thyroid uptake and scan are c/w  hyperthyroidism which will need definitive therapy. Due to incidental finding of cold nodule in the right lobe, she will need thyroid ultrasound.  If significant nodule identified, she may need FNA prior to radioactive iodine therapy. This therapy needs to be administered prior to discharge. Her ongoing nausea and vomiting is out of proportion to  hyperthyroidism, and may need further work up. Continue to support with IV and add dextrose until she tolerates oral nutrition.  I will continue to follow up.   LOS: 2 days    Loni Beckwith, MD 06/03/2014, 4:10 PM

## 2014-06-04 ENCOUNTER — Inpatient Hospital Stay (HOSPITAL_COMMUNITY): Payer: 59

## 2014-06-04 ENCOUNTER — Encounter (HOSPITAL_COMMUNITY): Payer: Self-pay | Admitting: Emergency Medicine

## 2014-06-04 LAB — URINE CULTURE: Colony Count: >=100000

## 2014-06-04 LAB — CBC
HCT: 29.9 % — ABNORMAL LOW (ref 36.0–46.0)
HEMOGLOBIN: 9.9 g/dL — AB (ref 12.0–15.0)
MCH: 28.4 pg (ref 26.0–34.0)
MCHC: 33.1 g/dL (ref 30.0–36.0)
MCV: 85.9 fL (ref 78.0–100.0)
PLATELETS: 181 10*3/uL (ref 150–400)
RBC: 3.48 MIL/uL — AB (ref 3.87–5.11)
RDW: 11.2 % — ABNORMAL LOW (ref 11.5–15.5)
WBC: 5.3 10*3/uL (ref 4.0–10.5)

## 2014-06-04 LAB — COMPREHENSIVE METABOLIC PANEL
ALBUMIN: 3 g/dL — AB (ref 3.5–5.2)
ALT: 18 U/L (ref 0–35)
AST: 21 U/L (ref 0–37)
Alkaline Phosphatase: 41 U/L (ref 39–117)
Anion gap: 11 (ref 5–15)
BUN: 7 mg/dL (ref 6–23)
CALCIUM: 9.5 mg/dL (ref 8.4–10.5)
CO2: 22 mmol/L (ref 19–32)
Chloride: 107 mmol/L (ref 96–112)
Creatinine, Ser: 0.46 mg/dL — ABNORMAL LOW (ref 0.50–1.10)
GFR calc Af Amer: >90 mL/min (ref >90)
GFR calc non Af Amer: >90 mL/min (ref >90)
Glucose, Bld: 74 mg/dL (ref 70–99)
Potassium: 3.6 mmol/L (ref 3.5–5.1)
Sodium: 140 mmol/L (ref 135–145)
TOTAL PROTEIN: 6.3 g/dL (ref 6.0–8.3)
Total Bilirubin: 0.8 mg/dL (ref 0.3–1.2)

## 2014-06-04 LAB — LIPASE, BLOOD: Lipase: 118 U/L — ABNORMAL HIGH (ref 11–59)

## 2014-06-04 MED ORDER — LIDOCAINE HCL (PF) 2 % IJ SOLN
INTRAMUSCULAR | Status: AC
  Administered 2014-06-04: 10 mL
  Filled 2014-06-04: qty 10

## 2014-06-04 NOTE — Progress Notes (Signed)
Patient tolerated Thyroid biopsy well and without complication. Vital signs stable post procedure. Respirations even and unlabored.

## 2014-06-04 NOTE — Sedation Documentation (Signed)
Patient tolerating procedure well at this time. No distress.

## 2014-06-04 NOTE — Progress Notes (Signed)
Her thyroid uptake and scan showed 45 % uptake confirming hyperthyroidism. Pt was also  found to have multinodular goiter with 1.5 cms cold nodule on right lobe. She is currently having  FNA of the the cold nodule , before ablative therapy is administered for hyperthyroidism. Continue to monitor. If FNA is negative she will get I131 therapy for hyperthyroidism.

## 2014-06-04 NOTE — Progress Notes (Signed)
Patient's spouse bought patient hamburger and fries. Patient ate half of it. Patient is on clear liquid diet. Dr. Rockne Menghini notified. New order to advance diet as tolerated. Patient also had loss of IV access. Attempted IV access x4 by nursing staff without success. Dr. Rockne Menghini notified. New order for PICC. IV nurse notified. IV nurses states she can get IV access. Dr. Rockne Menghini notified, states if peripheral IV access successful then discontinue order for PICC.

## 2014-06-04 NOTE — Progress Notes (Signed)
Progress Note   [name] SWN:[id number] DOB: Jun 28, 1965 DOA: 06/01/2014 PCP: No primary care provider on file.   Brief Narrative:   [name] is an 49 y.o. female with a PMH of hyperthyroidism, hypertension, GERD who was admitted 06/01/14 with complaints of a several day history of ongoing nausea, vomiting and epigastric pain. She also reported intermittent chest pain with mild shortness of breath and dysuria on admission. Patient was hemodynamically stable on admission. Blood work showed normal troponin level, normal renal function. UA showed large leukocytes and many bacteria. Pt was started on empiric rocephin. In addition, TSH level was low at 0.011 and T4 high at 8.6 suggestive of hyperthyroidism, so endocrinology was consulted.  Assessment/Plan:   Principal Problem: Abdominal pain, nausea and vomiting / low TSH and elevated T4 / hyperthyroidism - Signs and symptoms suggestive of hyperthyroidism; with concurrent pancreatitis.  - TSH low at 0.011, T4 is 8.64. - Status post endocrinology consultation with RAI ablation planned.  Given thyroid ultrasound findings of a cold nodule, will get thyroid biopsy. - Continue supportive care with IV fluids, analgesia and antiemetics as needed. - F/U H. Pylori screen. - Reglan added 06/03/14. - Given patient reports of similar pain when she had an ovarian cyst, pelvic ultrasound done 06/03/14, no adnexal masses seen, left ovary not visualized.  Active Problems: Hypokalemia - Supplemented.  Secondary to GI losses.  Acute Pancreatitis - Lipase level was 94 on admission. - Continue CL diet today. - Continue IV fluids, analgesia and antiemetics as needed. - Abdominal x ray showed no obstruction; abdominal ultrasound showed no gallstones or liver abnormalities.   Elevated D dimer - VQ scan ordered to R/O PE.  Canceled.  Patient not dyspneic, tachycardia explained by hyperthyroidism.  No chest pain.  UTI (urinary tract infection) - UA  on admission showed large leukocytes. - Continue rocephin IV daily. - Follow up final urine culture results.  Preliminary results growing >100,000 colonies of coagulase negative staph.  Malnutrition of moderate degree - Evaluated by dietician 06/02/24.   DVT Prophylaxis  - Continue SCDs.   Code Status: Full.  Family Communication: Plan of care discussed with the patient and her husband. Disposition Plan: Home when stable.   IV Access:    Peripheral IV   Procedures and diagnostic studies:   Ct Head Wo Contrast 06/01/2014 Stable and normal noncontrast CT appearance of the brain.   Dg Abd Acute W/chest 06/01/2014 1. Nonobstructive bowel gas pattern. 2. No pneumoperitoneum. 3. No radiographic evidence of acute cardiopulmonary disease.   US Abdomen Complete 06/02/2014 1. No gallstones. 2. No abnormality of the liver is seen. 3. The pancreas is largely obscured by bowel gas.   Nm Thyroid Sng Uptake W/imaging 06/03/2014: Elevated 24 hour radio iodine uptake of 45% consistent with hyperthyroidism.  Cold nodule at inferior pole RIGHT lobe; ultrasound assessment recommended prior to consideration of radioactive iodine ablation.     US Soft Tissue Head/neck 06/03/2014: Two solid nodules are noted in the inferior pole of the right thyroid lobe which correspond to cold defect seen on nuclear medicine study. The largest nodule measures 1.5 cm in diameter. Findings meet consensus criteria for biopsy. Ultrasound-guided fine needle aspiration should be considered, as per the consensus statement: Management of Thyroid Nodules Detected at [place]: Society of Radiologists in [place]. Radiology 2005; [id number].   US Pelvis Complete 06/03/2014: No adnexal masses seen.  Left ovary not visualized.  Prior hysterectomy.     Medical Consultants:  Cassandria Anger, MD, Endocrinology  Anti-Infectives:    Rocephin 06/01/14--->  Subjective:   [name]  reports improved nausea.  Last BM was yesterday.  No complaints of pain.  No dyspnea or palpitations.  Objective:    Filed Vitals:   06/03/14 0602 06/03/14 1731 06/03/14 2121 06/04/14 0504  BP: 119/59 153/64 156/76 132/66  Pulse: 92 81 84 85  Temp: 99 F (37.2 C) 99.1 F (37.3 C) 98.5 F (36.9 C) 97.6 F (36.4 C)  TempSrc: Oral Oral Oral Oral  Resp: 20 20 20 20   Height:      Weight:      SpO2: 100% 100% 100% 100%    Intake/Output Summary (Last 24 hours) at 06/04/14 0731 Last data filed at 06/04/14 0653  Gross per 24 hour  Intake    890 ml  Output    702 ml  Net    188 ml    Exam: Gen:  NAD Psych: Depressed affect Cardiovascular:  RRR, No M/R/G Respiratory:  Lungs CTAB Gastrointestinal:  Abdomen soft, NT/ND, + BS Extremities:  No C/E/C   Data Reviewed:    Labs: Basic Metabolic Panel:  Recent Labs Lab 06/01/14 1533 06/02/14 0307 06/03/14 0552 06/04/14 0545  NA 136 138 138 140  K 3.6 3.7 3.2* 3.6  CL 102 109 106 107  CO2 27 21 26 22   GLUCOSE 110* 87 94 74  BUN 22 18 8 7   CREATININE 0.67 0.61 0.42* 0.46*  CALCIUM 10.4 9.0 9.2 9.5   GFR Estimated Creatinine Clearance: 75.1 mL/min (by C-G formula based on Cr of 0.46). Liver Function Tests:  Recent Labs Lab 06/01/14 1533 06/02/14 0307 06/04/14 0545  AST 24 19 21   ALT 25 19 18   ALKPHOS 54 41 41  BILITOT 0.8 0.6 0.8  PROT 7.7 6.0 6.3  ALBUMIN 3.9 3.0* 3.0*    Recent Labs Lab 06/01/14 1533 06/04/14 0545  LIPASE 94* 118*   CBC:  Recent Labs Lab 06/01/14 1533 06/02/14 0307 06/03/14 0552 06/04/14 0545  WBC 8.1 5.7 6.0 5.3  NEUTROABS 5.9  --   --   --   HGB 12.7 9.9* 9.3* 9.9*  HCT 38.2 30.0* 28.1* 29.9*  MCV 85.5 86.5 85.4 85.9  PLT 261 210 197 181   Cardiac Enzymes:  Recent Labs Lab 06/01/14 1533 06/01/14 2132 06/02/14 0307  TROPONINI <0.03 <0.03 <0.03   D-Dimer:  Recent Labs  06/01/14 2132  DDIMER 1.44*   Lipid Profile:  Recent Labs  06/02/14 0307  CHOL 118    HDL 20*  LDLCALC 85  TRIG 63  CHOLHDL 5.9   Thyroid function studies:  Recent Labs  06/01/14 1533  TSH 0.011*   Sepsis Labs:  Recent Labs Lab 06/01/14 1530 06/01/14 1533 06/01/14 2132 06/01/14 2342 06/02/14 0307 06/03/14 0552 06/04/14 0545  WBC  --  8.1  --   --  5.7 6.0 5.3  LATICACIDVEN 1.87  --  1.1 0.9  --   --   --    Microbiology Recent Results (from the past 240 hour(s))  Clostridium Difficile by PCR     Status: None   Collection Time: 05/27/14  7:17 PM  Result Value Ref Range Status   C difficile by pcr NEGATIVE NEGATIVE Final  Culture, Urine     Status: None (Preliminary result)   Collection Time: 06/02/14 11:20 AM  Result Value Ref Range Status   Specimen Description URINE, CLEAN CATCH  Final   Special Requests NONE  Final  Colony Count   Final    >=100,000 COLONIES/ML Performed at [facility]    Culture   Final    STAPHYLOCOCCUS SPECIES (COAGULASE NEGATIVE) Note: RIFAMPIN AND GENTAMICIN SHOULD NOT BE USED AS SINGLE DRUGS FOR TREATMENT OF STAPH INFECTIONS. Performed at [facility]    Report Status PENDING  Incomplete     Medications:   . cefTRIAXone (ROCEPHIN)  IV  1 g Intravenous Q24H  . metoCLOPramide (REGLAN) injection  5 mg Intravenous 4 times per day  . pantoprazole  40 mg Oral BID AC  . phenazopyridine  100 mg Oral TID WC  . polyethylene glycol  17 g Oral Daily  . propranolol  20 mg Oral BID  . senna  1 tablet Oral BID  . sodium chloride  3 mL Intravenous Q12H   Continuous Infusions:    Time spent: 35 minutes with > 50% of time discussing current diagnostic test results, clinical impression and plan of care.   LOS: 3 days   [facility] Pager [phone]. If unable to reach me by pager, please call my cell phone at [phone].  *Please refer to [email or web address], password [email or web address] to get updated schedule on who will round on this patient, as hospitalists switch teams weekly. If 7PM-7AM, please contact  night-coverage at [email or web address], password [email or web address] for any overnight needs.  06/04/2014, 7:31 AM

## 2014-06-05 MED ORDER — ONDANSETRON 4 MG PO TBDP: 4.0000 mg | ORAL_TABLET | Freq: Three times a day (TID) | ORAL | Status: DC | PRN

## 2014-06-05 MED ORDER — OXYCODONE HCL 5 MG PO TABS: 5.0000 mg | ORAL_TABLET | ORAL | Status: DC | PRN

## 2014-06-05 MED ORDER — NITROFURANTOIN MONOHYD MACRO 100 MG PO CAPS: 100.0000 mg | ORAL_CAPSULE | Freq: Two times a day (BID) | ORAL | Status: DC

## 2014-06-05 NOTE — Procedures (Signed)
PreOperative Dx: RIGHT thyroid nodule Postoperative Dx: RIGHT thyroid nodule Procedure:   US guided FNA of RIGHT thyroid nodule Radiologist:  Thornton Papas Anesthesia:  3 ml of 1 lidocaine Specimen:  FNA x 4  EBL:   < 1 ml Complications: None

## 2014-06-05 NOTE — Discharge Summary (Signed)
Physician Discharge Summary  [name] LOV:[id number] DOB: December 07, 1965 DOA: 06/01/2014  PCP: No primary care provider on file.  Admit date: 06/01/2014 Discharge date: 06/05/2014  Time spent: 45 minutes  Recommendations for Outpatient Follow-up:  -Will be discharged home today. -Will follow up with Dr. Dorris Fetch later this week for results of thyroid biopsy and for determination on iodine ablation.   Discharge Diagnoses:  Principal Problem:   Abdominal pain Active Problems:   Pancreatitis   Essential hypertension   Tachycardia   Dehydration   UTI (urinary tract infection)   Altered mental status   Hyperthyroidism   Chest pain   Malnutrition of moderate degree   Elevated d-dimer   Discharge Condition: Stable and improved  Filed Weights   06/01/14 2132  Weight: 57.7 kg (127 lb 3.3 oz)    History of present illness:  [name] is a 49 y.o. female  2 day history of nausea and vomiting. Associated with epigastric pain. Comes and goes. Worse with food. Denies fever, diarrhea, rash. Patient also endorsing intermittent chest pain which is described as pressure in the center of her chest associated with shortness of breath. Worse with breathing. Onset of dysuria without frequency today. Denies back pain. Symptoms are getting worse. Patient has not tried anything to alleviate any of her symptoms.  Hospital Course:   Acute Pancreatitis -Clinically resolved. -Tolerating PO diet with minimal pain medication requirement.  Hyperthyroidism/Thyroid Nodule -Continue methimazole and propranolol. -Had thyroid nodule biopsy on 2/25. -OP f/u with endocrinology.  CNS UTI -Will DC on 5 days of macrobid.  Procedures:  None   Consultations:  Endocrinology, Dr. Dorris Fetch  Discharge Instructions  Discharge Instructions    Increase activity slowly    Complete by:  As directed             Medication List    TAKE these medications        methimazole 10 MG tablet  Commonly  known as:  TAPAZOLE  Take 10 mg by mouth daily.     multivitamin with minerals Tabs tablet  Take 1 tablet by mouth daily.     nitrofurantoin (macrocrystal-monohydrate) 100 MG capsule  Commonly known as:  MACROBID  Take 1 capsule (100 mg total) by mouth 2 (two) times daily.     ondansetron 4 MG disintegrating tablet  Commonly known as:  ZOFRAN ODT  Take 1 tablet (4 mg total) by mouth every 8 (eight) hours as needed for nausea or vomiting.     oxyCODONE 5 MG immediate release tablet  Commonly known as:  Oxy IR/ROXICODONE  Take 1 tablet (5 mg total) by mouth every 4 (four) hours as needed for severe pain.     pantoprazole 40 MG tablet  Commonly known as:  PROTONIX  Take 1 tablet (40 mg total) by mouth 2 (two) times daily before a meal.     promethazine 25 MG tablet  Commonly known as:  PHENERGAN  Take 25 mg by mouth every 6 (six) hours as needed for nausea or vomiting.     propranolol 20 MG tablet  Commonly known as:  INDERAL  Take 20 mg by mouth 2 (two) times daily.       Allergies  Allergen Reactions  . Contrast Media [Iodinated Diagnostic Agents] Rash  . Iohexol Nausea And Vomiting    Pt. Vomited immediately after injection of Omni 350.    . Vicodin [Hydrocodone-Acetaminophen] Rash       Follow-up Information    Follow up with  NIDA,GEBRESELASSIE, MD. Schedule an appointment as soon as possible for a visit in 3 days.   Specialty:  Endocrinology   Contact information:   [place] [address] [phone]       Schedule an appointment as soon as possible for a visit in 2 weeks to follow up.   Why:  with your regular physician       The results of significant diagnostics from this hospitalization (including imaging, microbiology, ancillary and laboratory) are listed below for reference.    Significant Diagnostic Studies: Ct Abdomen Pelvis Wo Contrast  05/23/2014   CLINICAL DATA:  49 year old female abdominal pain and vomiting. Endoscopy  yesterday. Initial encounter.  EXAM: CT ABDOMEN AND PELVIS WITHOUT CONTRAST  TECHNIQUE: Multidetector CT imaging of the abdomen and pelvis was performed following the standard protocol without IV contrast.  COMPARISON:  CT Abdomen and Pelvis 05/11/2014 and earlier  FINDINGS: minor lung base atelectasis. No pericardial or pleural effusion. No acute osseous abnormality identified.  Diminutive bladder. No pelvic free fluid. Uterus surgically absent. Stable right ovary.  No large bowel inflammation identified. Oral contrast has reached the terminal ileum. Appendix not identified today. No dilated small bowel. Negative stomach. No definite duodenum inflammation.  Noncontrast liver, gallbladder, spleen pancreas, adrenal glands, and kidneys appear stable and within normal limits. No abdominal free fluid or free air.  IMPRESSION: No definite acute or inflammatory process identified in the abdomen or pelvis in the absence of IV contrast.   Electronically Signed   By: Genevie Ann M.D.   On: 05/23/2014 17:13   Ct Abdomen Pelvis Wo Contrast  05/11/2014   CLINICAL DATA:  Persistent abdominal pain for approximately 13 days with vomiting.  EXAM: CT ABDOMEN AND PELVIS WITHOUT CONTRAST  TECHNIQUE: Multidetector CT imaging of the abdomen and pelvis was performed following the standard protocol without IV contrast. (Patient has a IV contrast allergy)  COMPARISON:  07/23/2010  FINDINGS: Lower chest: Clear lung bases. Normal heart size. No pericardial or pleural effusion.  Abdomen: The kidneys demonstrate no acute hydronephrosis, perinephric inflammation, or obstructing ureteral calculus on either side.  The liver, gallbladder, biliary system, pancreas, spleen, and adrenal glands are within normal limits for age and noncontrast imaging.  Slight wall prominence of the distal stomach and the duodenum, difficult to exclude mild gastritis/ duodenitis.  No associated bowel obstruction, dilatation, ileus, or free air.  Pelvis: Prior  hysterectomy. No pelvic free fluid, fluid collection, hemorrhage, abscess, adenopathy, inguinal abnormality, or hernia. Urinary bladder unremarkable. Pelvic calcifications consistent with venous phleboliths. No acute distal bowel process. Portions of the appendix are demonstrated across the right iliac vessels, images 59 and 60 without evidence of appendicitis. Right lower quadrant lobulated soft tissue overlies the right iliopsoas muscle, image 60. This measures 5.0 x 2.1 cm, image 60 and correlates with an enlarged right ovary when compared to prior contrast exam.  No acute osseous finding.  IMPRESSION: Distal stomach and diffuse duodenal slightly wall prominence, nonspecific but could represent mild gastritis/ duodenitis. No associated bowel obstruction, abscess or free air.  No acute obstructing urinary tract calculus or hydronephrosis.  Normal appendix.  Slightly smaller but still enlarged right ovary measuring 5 x 2.1 cm. This remains nonspecific. Consider follow-up nonemergent pelvic ultrasound.   Electronically Signed   By: Daryll Brod M.D.   On: 05/11/2014 16:55   Dg Chest 2 View  05/11/2014   CLINICAL DATA:  Nausea, vomiting, diarrhea and headache.  EXAM: CHEST - 2 VIEW  COMPARISON:  CTA of the chest on 04/30/2011  FINDINGS: The heart size and mediastinal contours are within normal limits. There is no evidence of pulmonary edema, consolidation, pneumothorax, nodule or pleural fluid. The visualized skeletal structures are unremarkable.  IMPRESSION: No active disease.   Electronically Signed   By: Aletta Edouard M.D.   On: 05/11/2014 16:30   Ct Head Wo Contrast  06/01/2014   CLINICAL DATA:  49 year old female with altered mental status weakness nausea vomiting diarrhea and headache. Initial encounter.  EXAM: CT HEAD WITHOUT CONTRAST  TECHNIQUE: Contiguous axial images were obtained from the base of the skull through the vertex without intravenous contrast.  COMPARISON:  Normal noncontrast CT  appearance of the brain. MRI 07/18/2011 and earlier.  FINDINGS: Visualized paranasal sinuses and mastoids are clear. No acute osseous abnormality identified. Visualized orbits and scalp soft tissues are within normal limits.  Cerebral volume is stable and within normal limits for age. No midline shift, ventriculomegaly, mass effect, evidence of mass lesion, intracranial hemorrhage or evidence of cortically based acute infarction. Gray-white matter differentiation is within normal limits throughout the brain. No suspicious intracranial vascular hyperdensity.  IMPRESSION: Stable and normal noncontrast CT appearance of the brain.   Electronically Signed   By: Genevie Ann M.D.   On: 06/01/2014 16:16   Nm Thyroid Sng Uptake W/imaging  06/03/2014   CLINICAL DATA:  Hyperthyroidism without crisis  EXAM: THYROID SCAN AND UPTAKE - 24 HOURS  TECHNIQUE: Following the per oral administration of I-131 sodium iodide, the patient returned at 24 hours and uptake measurements were acquired with the uptake probe centered on the neck. Thyroid imaging was performed following the intravenous administration of the Tc-40m Pertechnetate.  RADIOPHARMACEUTICALS:  9 microCuries I-131 Sodium Iodide and 10 mCi TC-60m Pertechnetate  COMPARISON:  None  FINDINGS: 24 hour radio iodine uptake is calculated at 45%, above the normal range consistent with hyperthyroidism.  Imaging the thyroid gland in 3 projections demonstrates homogeneous tracer distribution throughout both lobes with a cold nodule at the inferior pole of the RIGHT lobe.  IMPRESSION: Elevated 24 hour radio iodine uptake of 45% consistent with hyperthyroidism.  Cold nodule at inferior pole RIGHT lobe; ultrasound assessment recommended prior to consideration of radioactive iodine ablation.   Electronically Signed   By: Lavonia Dana M.D.   On: 06/03/2014 13:13   US Soft Tissue Head/neck  06/03/2014   CLINICAL DATA:  Cold nodules seen on nuclear medicine study.  EXAM: THYROID ULTRASOUND   TECHNIQUE: Ultrasound examination of the thyroid gland and adjacent soft tissues was performed.  COMPARISON:  Nuclear medicine study of same day.  FINDINGS: Right thyroid lobe  Measurements: 4.8 x 1.8 x 1.6 cm. Solid nodule measuring 1.5 x 1.1 x 1.0 cm is noted in lower pole. Adjacent solid nodule measuring 1.4 x 1.1 x 1.0 cm is noted. These correspond in location to defect seen on nuclear medicine study.  Left thyroid lobe  Measurements: 5.0 x 2.1 x 1.3 cm.  No nodules visualized.  Isthmus  Thickness: 5 mm.  No nodules visualized.  Lymphadenopathy  None visualized.  IMPRESSION: Two solid nodules are noted in the inferior pole of the right thyroid lobe which correspond to cold defect seen on nuclear medicine study. The largest nodule measures 1.5 cm in diameter. Findings meet consensus criteria for biopsy. Ultrasound-guided fine needle aspiration should be considered, as per the consensus statement: Management of Thyroid Nodules Detected at [place]: Society of Radiologists in [place]. Radiology 2005; [id number].   Electronically  Signed   By: Marijo Conception, M.D.   On: 06/03/2014 16:56   US Abdomen Complete  06/02/2014   CLINICAL DATA:  History of pancreatitis, epigastric pain, some nausea  EXAM: ULTRASOUND ABDOMEN COMPLETE  COMPARISON:  Acute abdomen films of 06/01/2014 and CT abdomen pelvis of 05/23/2014  FINDINGS: Gallbladder: The gallbladder is visualized and no gallstones are noted. There is no pain over the gallbladder with compression  Common bile duct: Diameter: The common bile duct is normal measuring 4.7 mm.  Liver: The liver has a normal echogenic pattern. No focal hepatic abnormality is seen.  IVC: No abnormality visualized.  Pancreas: The pancreas is largely obscured by bowel gas and cannot be evaluated.  Spleen: The spleen is normal measuring 4.9 cm.  Right Kidney: Length: 11.3 cm.  No hydronephrosis is seen.  Left Kidney: Length: 11.0 cm.  No hydronephrosis is seen.   Abdominal aorta: The abdominal aorta is normal in caliber.  Other findings: None.  IMPRESSION: 1. No gallstones. 2. No abnormality of the liver is seen. 3. The pancreas is largely obscured by bowel gas.   Electronically Signed   By: Ivar Drape M.D.   On: 06/02/2014 14:05   US Pelvis Complete  06/03/2014   CLINICAL DATA:  Right ovarian cyst per CT.  EXAM: TRANSABDOMINAL ULTRASOUND OF PELVIS  TECHNIQUE: Transabdominal ultrasound examination of the pelvis was performed including evaluation of the uterus, ovaries, adnexal regions, and pelvic cul-de-sac.  COMPARISON:  CT 05/23/2014  FINDINGS: Uterus  Measurements: Prior hysterectomy.  Endometrium  Thickness: N/A.  Right ovary  Measurements: 4.6 x 2.3 x 1.6 cm. Normal appearance/no adnexal mass.  Left ovary  Measurements: Not visualized.  Other findings: No free fluid. Patient refused transvaginal imaging.  IMPRESSION: No adnexal masses seen.  Left ovary not visualized.  Prior hysterectomy.   Electronically Signed   By: Rolm Baptise M.D.   On: 06/03/2014 15:35   US Abdomen Limited  05/23/2014   CLINICAL DATA:  49 year old female with subacute right upper quadrant pain, vomiting and weight loss. Initial encounter.  EXAM: US ABDOMEN LIMITED - RIGHT UPPER QUADRANT  COMPARISON:  05/11/2014 and prior CTs.  FINDINGS: Gallbladder:  The gallbladder is unremarkable. There is no evidence of cholelithiasis or acute cholecystitis.  Common bile duct:  Diameter: 3.0 mm. There is no evidence of intrahepatic or extrahepatic biliary dilatation.  Liver:  No focal lesion identified. Within normal limits in parenchymal echogenicity.  There is no evidence of ascites within the right upper abdomen.  IMPRESSION: Unremarkable right upper quadrant abdominal ultrasound.   Electronically Signed   By: Margarette Canada M.D.   On: 05/23/2014 13:21   US Thyroid Biopsy  06/05/2014   CLINICAL DATA:  RIGHT thyroid nodules  EXAM: ULTRASOUND GUIDED FINE NEEDLE ASPIRATE BIOPSY OF A RIGHT THYROID NODULE   MEDICATIONS: 3 mL of 1% lidocaine local anesthesia  PROCEDURE: Procedure, risks, benefits and alternatives discussed with the patient.  Patient's questions answered.  Written informed consent for thyroid nodule FNA obtained.  Time-out protocol followed.  Dominant nodule in inferior RIGHT thyroid lobe localized by ultrasound  Nodule measures 1.6 cm asthma diameter.  Skin prepped and draped in usual sterile fashion.  Skin and overlying soft tissues anesthetized with 3 mL of 1% lidocaine.  Under direct sonographic visualization, 4 fine-needle aspirates of the RIGHT lobe nodule were obtained.  Procedure tolerated well by patient.  Specimen sent to cytology for evaluation.  COMPLICATIONS: None  FINDINGS: As above  IMPRESSION: Ultrasound-guided FNA of  an inferior pole RIGHT lobe thyroid nodule.   Electronically Signed   By: Lavonia Dana M.D.   On: 06/05/2014 08:11   Dg Chest Port 1 View  05/24/2014   CLINICAL DATA:  Feeling very weak para nausea and vomiting, diarrhea for 3 weeks.  EXAM: PORTABLE CHEST - 1 VIEW  COMPARISON:  05/11/2014  FINDINGS: Heart size is normal. Lungs are clear. No pulmonary edema. Visualized osseous structures have a normal appearance.  IMPRESSION: No active disease.   Electronically Signed   By: Nolon Nations M.D.   On: 05/24/2014 15:08   Dg Abd Acute W/chest  06/01/2014   CLINICAL DATA:  49 year old female with weakness.  EXAM: ACUTE ABDOMEN SERIES (ABDOMEN 2 VIEW & CHEST 1 VIEW)  COMPARISON:  Chest x-ray 05/24/2014. CT of the abdomen and pelvis 05/23/2014.  FINDINGS: Lung volumes are normal. No consolidative airspace disease. No pleural effusions. No pneumothorax. No pulmonary nodule or mass noted. Pulmonary vasculature and the cardiomediastinal silhouette are within normal limits.  Gas and stool are seen scattered throughout the colon extending to the level of the distal rectum. No pathologic distension of small bowel is noted. No gross evidence of pneumoperitoneum.  IMPRESSION: 1.   Nonobstructive bowel gas pattern. 2. No pneumoperitoneum. 3. No radiographic evidence of acute cardiopulmonary disease.   Electronically Signed   By: Vinnie Langton M.D.   On: 06/01/2014 16:38    Microbiology: Recent Results (from the past 240 hour(s))  Clostridium Difficile by PCR     Status: None   Collection Time: 05/27/14  7:17 PM  Result Value Ref Range Status   C difficile by pcr NEGATIVE NEGATIVE Final  Culture, Urine     Status: None   Collection Time: 06/02/14 11:20 AM  Result Value Ref Range Status   Specimen Description URINE, CLEAN CATCH  Final   Special Requests NONE  Final   Colony Count   Final    >=100,000 COLONIES/ML Performed at [facility]    Culture   Final    STAPHYLOCOCCUS SPECIES (COAGULASE NEGATIVE) Note: RIFAMPIN AND GENTAMICIN SHOULD NOT BE USED AS SINGLE DRUGS FOR TREATMENT OF STAPH INFECTIONS. Performed at [facility]    Report Status 06/04/2014 FINAL  Final   Organism ID, Bacteria STAPHYLOCOCCUS SPECIES (COAGULASE NEGATIVE)  Final      Susceptibility   Staphylococcus species (coagulase negative) - MIC*    GENTAMICIN >=16 RESISTANT Resistant     LEVOFLOXACIN >=8 RESISTANT Resistant     NITROFURANTOIN <=16 SENSITIVE Sensitive     OXACILLIN >=4 RESISTANT Resistant     PENICILLIN >=0.5 RESISTANT Resistant     RIFAMPIN <=0.5 SENSITIVE Sensitive     TRIMETH/SULFA 160 RESISTANT Resistant     VANCOMYCIN 2 SENSITIVE Sensitive     TETRACYCLINE 2 SENSITIVE Sensitive     * STAPHYLOCOCCUS SPECIES (COAGULASE NEGATIVE)     Labs: Basic Metabolic Panel:  Recent Labs Lab 06/01/14 1533 06/02/14 0307 06/03/14 0552 06/04/14 0545  NA 136 138 138 140  K 3.6 3.7 3.2* 3.6  CL 102 109 106 107  CO2 27 21 26 22   GLUCOSE 110* 87 94 74  BUN 22 18 8 7   CREATININE 0.67 0.61 0.42* 0.46*  CALCIUM 10.4 9.0 9.2 9.5   Liver Function Tests:  Recent Labs Lab 06/01/14 1533 06/02/14 0307 06/04/14 0545  AST 24 19 21   ALT 25 19 18   ALKPHOS 54 41  41  BILITOT 0.8 0.6 0.8  PROT 7.7 6.0 6.3  ALBUMIN 3.9  3.0* 3.0*    Recent Labs Lab 06/01/14 1533 06/04/14 0545  LIPASE 94* 118*   No results for input(s): AMMONIA in the last 168 hours. CBC:  Recent Labs Lab 06/01/14 1533 06/02/14 0307 06/03/14 0552 06/04/14 0545  WBC 8.1 5.7 6.0 5.3  NEUTROABS 5.9  --   --   --   HGB 12.7 9.9* 9.3* 9.9*  HCT 38.2 30.0* 28.1* 29.9*  MCV 85.5 86.5 85.4 85.9  PLT 261 210 197 181   Cardiac Enzymes:  Recent Labs Lab 06/01/14 1533 06/01/14 2132 06/02/14 0307  TROPONINI <0.03 <0.03 <0.03   BNP: BNP (last 3 results) No results for input(s): BNP in the last 8760 hours.  ProBNP (last 3 results) No results for input(s): PROBNP in the last 8760 hours.  CBG: No results for input(s): GLUCAP in the last 168 hours.     SignedLelon Frohlich  [facility] Pager: [phone] 06/05/2014, 4:19 PM

## 2014-06-05 NOTE — Progress Notes (Addendum)
Patient discharged with instructions, prescription, and care notes.  Verbalized understanding via teach back.  IV was removed and the site was WNL. Patient voiced no further complaints or concerns at the time of discharge.  Appointment scheduled per instructions.  Patient left the floor via w/c with staff and family in stable condition.  Prior to d/c notified Dr. Jerilee Hoh about patients request for filling out FMLA/Disability forms.  MD stated that the Primary Care Physician would need to fill out the documents since she would not have all the information to fill them out.  She stated that she would go down and discuss with the patient.  Discussed with the patient and significant other.  They verbalized understanding.

## 2014-06-06 LAB — H.PYLORI ANTIGEN, STOOL: H. pylori ag, stool: POSITIVE

## 2014-06-09 ENCOUNTER — Inpatient Hospital Stay (HOSPITAL_COMMUNITY): Payer: 59

## 2014-06-09 ENCOUNTER — Encounter (HOSPITAL_COMMUNITY): Payer: Self-pay

## 2014-06-09 ENCOUNTER — Inpatient Hospital Stay (HOSPITAL_COMMUNITY)
Admission: EM | Admit: 2014-06-09 | Discharge: 2014-06-15 | DRG: 644 | Disposition: A | Discharge status: Home / Self Care | Payer: 59 | Attending: Internal Medicine | Admitting: Internal Medicine

## 2014-06-09 DIAGNOSIS — Z3202 Encounter for pregnancy test, result negative: Secondary | ICD-10-CM | POA: Diagnosis present

## 2014-06-09 DIAGNOSIS — R739 Hyperglycemia, unspecified: Secondary | ICD-10-CM | POA: Diagnosis present

## 2014-06-09 DIAGNOSIS — T50905A Adverse effect of unspecified drugs, medicaments and biological substances, initial encounter: Secondary | ICD-10-CM

## 2014-06-09 DIAGNOSIS — E860 Dehydration: Secondary | ICD-10-CM | POA: Diagnosis present

## 2014-06-09 DIAGNOSIS — E0590 Thyrotoxicosis, unspecified without thyrotoxic crisis or storm: Secondary | ICD-10-CM

## 2014-06-09 DIAGNOSIS — R112 Nausea with vomiting, unspecified: Secondary | ICD-10-CM | POA: Diagnosis not present

## 2014-06-09 DIAGNOSIS — Z681 Body mass index (BMI) 19 or less, adult: Secondary | ICD-10-CM

## 2014-06-09 DIAGNOSIS — D6489 Other specified anemias: Secondary | ICD-10-CM | POA: Diagnosis present

## 2014-06-09 DIAGNOSIS — Z90710 Acquired absence of both cervix and uterus: Secondary | ICD-10-CM | POA: Diagnosis not present

## 2014-06-09 DIAGNOSIS — E440 Moderate protein-calorie malnutrition: Secondary | ICD-10-CM | POA: Diagnosis present

## 2014-06-09 DIAGNOSIS — R109 Unspecified abdominal pain: Secondary | ICD-10-CM | POA: Diagnosis present

## 2014-06-09 DIAGNOSIS — I10 Essential (primary) hypertension: Secondary | ICD-10-CM | POA: Diagnosis present

## 2014-06-09 DIAGNOSIS — E0591 Thyrotoxicosis, unspecified with thyrotoxic crisis or storm: Secondary | ICD-10-CM

## 2014-06-09 DIAGNOSIS — D638 Anemia in other chronic diseases classified elsewhere: Secondary | ICD-10-CM | POA: Diagnosis present

## 2014-06-09 DIAGNOSIS — J45909 Unspecified asthma, uncomplicated: Secondary | ICD-10-CM | POA: Diagnosis present

## 2014-06-09 DIAGNOSIS — Z833 Family history of diabetes mellitus: Secondary | ICD-10-CM

## 2014-06-09 DIAGNOSIS — T380X5A Adverse effect of glucocorticoids and synthetic analogues, initial encounter: Secondary | ICD-10-CM | POA: Diagnosis present

## 2014-06-09 DIAGNOSIS — R0602 Shortness of breath: Secondary | ICD-10-CM

## 2014-06-09 DIAGNOSIS — R000 Tachycardia, unspecified: Secondary | ICD-10-CM | POA: Diagnosis present

## 2014-06-09 DIAGNOSIS — E876 Hypokalemia: Secondary | ICD-10-CM | POA: Diagnosis present

## 2014-06-09 DIAGNOSIS — Z8249 Family history of ischemic heart disease and other diseases of the circulatory system: Secondary | ICD-10-CM | POA: Diagnosis not present

## 2014-06-09 DIAGNOSIS — E0511 Thyrotoxicosis with toxic single thyroid nodule with thyrotoxic crisis or storm: Secondary | ICD-10-CM | POA: Diagnosis present

## 2014-06-09 DIAGNOSIS — R531 Weakness: Secondary | ICD-10-CM

## 2014-06-09 HISTORY — DX: Thyrotoxicosis, unspecified with thyrotoxic crisis or storm: E05.91

## 2014-06-09 LAB — COMPREHENSIVE METABOLIC PANEL
ALBUMIN: 3.7 g/dL (ref 3.5–5.2)
ALK PHOS: 58 U/L (ref 39–117)
ALT: 25 U/L (ref 0–35)
ANION GAP: 15 (ref 5–15)
AST: 34 U/L (ref 0–37)
BUN: 20 mg/dL (ref 6–23)
CO2: 20 mmol/L (ref 19–32)
Calcium: 10.7 mg/dL — ABNORMAL HIGH (ref 8.4–10.5)
Chloride: 98 mmol/L (ref 96–112)
Creatinine, Ser: 0.71 mg/dL (ref 0.50–1.10)
GFR calc Af Amer: >90 mL/min (ref >90)
GFR calc non Af Amer: >90 mL/min (ref >90)
Glucose, Bld: 116 mg/dL — ABNORMAL HIGH (ref 70–99)
Potassium: 3.2 mmol/L — ABNORMAL LOW (ref 3.5–5.1)
Sodium: 133 mmol/L — ABNORMAL LOW (ref 135–145)
TOTAL PROTEIN: 7.8 g/dL (ref 6.0–8.3)
Total Bilirubin: 0.8 mg/dL (ref 0.3–1.2)

## 2014-06-09 LAB — CBC WITH DIFFERENTIAL/PLATELET
Basophils Absolute: 0 10*3/uL (ref 0.0–0.1)
Basophils Relative: 0 % (ref 0–1)
Eosinophils Absolute: 0 10*3/uL (ref 0.0–0.7)
Eosinophils Relative: 0 % (ref 0–5)
HEMATOCRIT: 37.7 % (ref 36.0–46.0)
HEMOGLOBIN: 12.5 g/dL (ref 12.0–15.0)
Lymphocytes Relative: 49 % — ABNORMAL HIGH (ref 12–46)
Lymphs Abs: 2.7 10*3/uL (ref 0.7–4.0)
MCH: 28.7 pg (ref 26.0–34.0)
MCHC: 33.2 g/dL (ref 30.0–36.0)
MCV: 86.7 fL (ref 78.0–100.0)
MONOS PCT: 12 % (ref 3–12)
Monocytes Absolute: 0.7 10*3/uL (ref 0.1–1.0)
Neutro Abs: 2.2 10*3/uL (ref 1.7–7.7)
Neutrophils Relative %: 39 % — ABNORMAL LOW (ref 43–77)
Platelets: 286 10*3/uL (ref 150–400)
RBC: 4.35 MIL/uL (ref 3.87–5.11)
RDW: 11.5 % (ref 11.5–15.5)
WBC: 5.6 10*3/uL (ref 4.0–10.5)

## 2014-06-09 LAB — URINALYSIS, ROUTINE W REFLEX MICROSCOPIC
Glucose, UA: NEGATIVE mg/dL
LEUKOCYTES UA: NEGATIVE
NITRITE: NEGATIVE
PROTEIN: 30 mg/dL — AB
Specific Gravity, Urine: >1.03 — ABNORMAL HIGH (ref 1.005–1.030)
Urobilinogen, UA: 0.2 mg/dL (ref 0.0–1.0)
pH: 6 (ref 5.0–8.0)

## 2014-06-09 LAB — TROPONIN I: Troponin I: <0.03 ng/mL (ref <0.031)

## 2014-06-09 LAB — URINE MICROSCOPIC-ADD ON

## 2014-06-09 LAB — BRAIN NATRIURETIC PEPTIDE: B Natriuretic Peptide: 5 pg/mL (ref 0.0–100.0)

## 2014-06-09 MED ORDER — DEXTROSE-NACL 5-0.9 % IV SOLN
INTRAVENOUS | Status: DC
  Administered 2014-06-09: 13:00:00 via INTRAVENOUS

## 2014-06-09 MED ORDER — PROPYLTHIOURACIL 50 MG PO TABS
150.0000 mg | ORAL_TABLET | Freq: Four times a day (QID) | ORAL | Status: DC
  Administered 2014-06-09 – 2014-06-11 (×8): 150 mg via ORAL
  Filled 2014-06-09 (×13): qty 3

## 2014-06-09 MED ORDER — BOOST / RESOURCE BREEZE PO LIQD
1.0000 | Freq: Three times a day (TID) | ORAL | Status: DC
  Administered 2014-06-09 – 2014-06-10 (×2): 1 via ORAL

## 2014-06-09 MED ORDER — PROPRANOLOL HCL 20 MG PO TABS
40.0000 mg | ORAL_TABLET | Freq: Four times a day (QID) | ORAL | Status: DC
  Administered 2014-06-09 – 2014-06-11 (×8): 40 mg via ORAL
  Filled 2014-06-09 (×3): qty 2
  Filled 2014-06-09: qty 4
  Filled 2014-06-09 (×4): qty 2

## 2014-06-09 MED ORDER — ACETAMINOPHEN 650 MG RE SUPP: 650.0000 mg | Freq: Four times a day (QID) | RECTAL | Status: DC | PRN

## 2014-06-09 MED ORDER — ENOXAPARIN SODIUM 40 MG/0.4ML ~~LOC~~ SOLN
40.0000 mg | SUBCUTANEOUS | Status: DC
  Filled 2014-06-09 (×5): qty 0.4

## 2014-06-09 MED ORDER — HYDROCORTISONE NA SUCCINATE PF 100 MG IJ SOLR
60.0000 mg | Freq: Four times a day (QID) | INTRAMUSCULAR | Status: DC
  Administered 2014-06-09 – 2014-06-11 (×8): 60 mg via INTRAVENOUS
  Filled 2014-06-09 (×8): qty 2

## 2014-06-09 MED ORDER — KCL IN DEXTROSE-NACL 40-5-0.9 MEQ/L-%-% IV SOLN
INTRAVENOUS | Status: DC
  Administered 2014-06-09 – 2014-06-10 (×2): via INTRAVENOUS
  Filled 2014-06-09 (×3): qty 1000

## 2014-06-09 MED ORDER — ONDANSETRON HCL 4 MG PO TABS: 4.0000 mg | ORAL_TABLET | Freq: Four times a day (QID) | ORAL | Status: DC | PRN

## 2014-06-09 MED ORDER — PROPRANOLOL HCL 1 MG/ML IV SOLN
1.0000 mg | Freq: Once | INTRAVENOUS | Status: AC
  Administered 2014-06-09: 1 mg via INTRAVENOUS
  Filled 2014-06-09: qty 1

## 2014-06-09 MED ORDER — SODIUM CHLORIDE 0.9 % IV SOLN: INTRAVENOUS | Status: DC

## 2014-06-09 MED ORDER — ACETAMINOPHEN 325 MG PO TABS: 650.0000 mg | ORAL_TABLET | Freq: Four times a day (QID) | ORAL | Status: DC | PRN

## 2014-06-09 MED ORDER — SODIUM CHLORIDE 0.9 % IV BOLUS (SEPSIS)
1000.0000 mL | Freq: Once | INTRAVENOUS | Status: AC
  Administered 2014-06-09: 1000 mL via INTRAVENOUS

## 2014-06-09 MED ORDER — ONDANSETRON HCL 4 MG/2ML IJ SOLN
4.0000 mg | Freq: Four times a day (QID) | INTRAMUSCULAR | Status: DC | PRN
  Administered 2014-06-09 – 2014-06-10 (×2): 4 mg via INTRAVENOUS
  Filled 2014-06-09 (×2): qty 2

## 2014-06-09 MED ORDER — POTASSIUM CHLORIDE CRYS ER 20 MEQ PO TBCR
40.0000 meq | EXTENDED_RELEASE_TABLET | Freq: Once | ORAL | Status: AC
  Administered 2014-06-09: 40 meq via ORAL
  Filled 2014-06-09: qty 2

## 2014-06-09 NOTE — Consult Note (Signed)
[name] MRN: [id number] DOB/AGE: 1966-01-12 49 y.o. Primary Care Physician:No primary care provider on file. Admit date: 06/09/2014 Chief Complaint:  Hyperthyroidism HPI:  49 yr old female with following medical hx . This is the 3rd time she is coming to ER for same complaints.  She is being seen in consultation for hyperthyroidism diagnosed last week. She was in the hospital  last week for her second admission  for intractable nausea nad voming .  Workup incidentally showed  Hyperthyroidism. During thyroid uptake and scan , she was found to have cold nodule on the left lobe of the  thyroid . Subsequently she underwent FNA of the nodule in preparation for radioactive iodine  therapy. Her biopsy was released this AM as  benign, but RAI was not administered yet. She continued to have intractable nausea and vomiting over the weekend , her husband brought her to clinic.  We had to call ambulance to take her to ER . She has had hypotension, tachycardia, nausea/vomiting.  She has dealt with symptoms of heat intolerance, fatigue, sleep disturbance for at least 2 weeks. she is s/p hysterectomy. She lost an unquantified amount of weight over 3 weeks of symptoms of nausea and vomiting.  Past Medical History  Diagnosis Date  . Hypertension   . Asthma   . Hyperthyroidism   . Enlarged ovary     right CT 05/2014    Past Surgical History  Procedure Laterality Date  . Abdominal hysterectomy    . Ovarian cyst surgery    . Esophagogastroduodenoscopy N/A 05/26/2014    Procedure: ESOPHAGOGASTRODUODENOSCOPY (EGD);  Surgeon: Danie Binder, MD;  Location: [facility];  Service: Endoscopy;  Laterality: N/A;  . Balloon dilation N/A 05/26/2014    Procedure: BALLOON DILATION OF THE PYLORUS;  Surgeon: Danie Binder, MD;  Location: [facility];  Service: Endoscopy;  Laterality: N/A;     Family History  Problem Relation Age of Onset  . Diabetes Mother   . Diabetes Father   . Hypertension Mother   .  Hypertension Father     Social History:  reports that she has never smoked. She does not have any smokeless tobacco history on file. She reports that she does not drink alcohol or use illicit drugs.   Allergies:  Allergies  Allergen Reactions  . Contrast Media [Iodinated Diagnostic Agents] Rash  . Iohexol Nausea And Vomiting    Pt. Vomited immediately after injection of Omni 350.    . Vicodin [Hydrocodone-Acetaminophen] Rash     (Not in a hospital admission)     CNO:BSJGG from the symptoms mentioned above,there are no other symptoms referable to all systems reviewed.  Physical Exam: Blood pressure 113/77, pulse 113, temperature 98.5 F (36.9 C), temperature source Oral, resp. rate 27, height 5\' 4"  (1.626 m), weight 49.896 kg (110 lb), SpO2 100 %.   GEN:  weak, but no acute distress. HEENT: no exophthalmos Neck: no gross goiter Chest: CLTB CVS : RRR, no murmur. She has tachycardia. Abdomen: mild BLQ tenderness Ext: no edema, global wasting of skeletal muscles. CNS:No gross deficit.    Recent Labs  06/09/14 1114  WBC 5.6  NEUTROABS 2.2  HGB 12.5  HCT 37.7  MCV 86.7  PLT 286    Recent Labs  06/09/14 1114  NA 133*  K 3.2*  CL 98  CO2 20  GLUCOSE 116*  BUN 20  CREATININE 0.71  CALCIUM 10.7*    Recent Labs  06/09/14 1114  AST 34  ALT  25  ALKPHOS 11  BILITOT 0.8  PROT 7.8  ALBUMIN 3.7   Recent Results (from the past 240 hour(s))  Culture, Urine     Status: None   Collection Time: 06/02/14 11:20 AM  Result Value Ref Range Status   Specimen Description URINE, CLEAN CATCH  Final   Special Requests NONE  Final   Colony Count   Final    >=100,000 COLONIES/ML Performed at [facility]    Culture   Final    STAPHYLOCOCCUS SPECIES (COAGULASE NEGATIVE) Note: RIFAMPIN AND GENTAMICIN SHOULD NOT BE USED AS SINGLE DRUGS FOR TREATMENT OF STAPH INFECTIONS. Performed at [facility]    Report Status 06/04/2014 FINAL  Final   Organism  ID, Bacteria STAPHYLOCOCCUS SPECIES (COAGULASE NEGATIVE)  Final      Susceptibility   Staphylococcus species (coagulase negative) - MIC*    GENTAMICIN >=16 RESISTANT Resistant     LEVOFLOXACIN >=8 RESISTANT Resistant     NITROFURANTOIN <=16 SENSITIVE Sensitive     OXACILLIN >=4 RESISTANT Resistant     PENICILLIN >=0.5 RESISTANT Resistant     RIFAMPIN <=0.5 SENSITIVE Sensitive     TRIMETH/SULFA 160 RESISTANT Resistant     VANCOMYCIN 2 SENSITIVE Sensitive     TETRACYCLINE 2 SENSITIVE Sensitive     * STAPHYLOCOCCUS SPECIES (COAGULASE NEGATIVE)  H.pylori Antigen, Stool     Status: None   Collection Time: 06/03/14  2:00 PM  Result Value Ref Range Status   Specimen Description STOOL  Final   Special Requests NONE  Final   H. pylori ag, stool POSITIVE Performed at [facility]   Final   Report Status 06/06/2014 FINAL  Final    Ct Abdomen Pelvis Wo Contrast  05/23/2014   CLINICAL DATA:  49 year old female abdominal pain and vomiting. Endoscopy yesterday. Initial encounter.  EXAM: CT ABDOMEN AND PELVIS WITHOUT CONTRAST  TECHNIQUE: Multidetector CT imaging of the abdomen and pelvis was performed following the standard protocol without IV contrast.  COMPARISON:  CT Abdomen and Pelvis 05/11/2014 and earlier  FINDINGS: minor lung base atelectasis. No pericardial or pleural effusion. No acute osseous abnormality identified.  Diminutive bladder. No pelvic free fluid. Uterus surgically absent. Stable right ovary.  No large bowel inflammation identified. Oral contrast has reached the terminal ileum. Appendix not identified today. No dilated small bowel. Negative stomach. No definite duodenum inflammation.  Noncontrast liver, gallbladder, spleen pancreas, adrenal glands, and kidneys appear stable and within normal limits. No abdominal free fluid or free air.  IMPRESSION: No definite acute or inflammatory process identified in the abdomen or pelvis in the absence of IV contrast.   Electronically Signed    By: Genevie Ann M.D.   On: 05/23/2014 17:13   Ct Abdomen Pelvis Wo Contrast  05/11/2014   CLINICAL DATA:  Persistent abdominal pain for approximately 13 days with vomiting.  EXAM: CT ABDOMEN AND PELVIS WITHOUT CONTRAST  TECHNIQUE: Multidetector CT imaging of the abdomen and pelvis was performed following the standard protocol without IV contrast. (Patient has a IV contrast allergy)  COMPARISON:  07/23/2010  FINDINGS: Lower chest: Clear lung bases. Normal heart size. No pericardial or pleural effusion.  Abdomen: The kidneys demonstrate no acute hydronephrosis, perinephric inflammation, or obstructing ureteral calculus on either side.  The liver, gallbladder, biliary system, pancreas, spleen, and adrenal glands are within normal limits for age and noncontrast imaging.  Slight wall prominence of the distal stomach and the duodenum, difficult to exclude mild gastritis/ duodenitis.  No associated bowel obstruction,  dilatation, ileus, or free air.  Pelvis: Prior hysterectomy. No pelvic free fluid, fluid collection, hemorrhage, abscess, adenopathy, inguinal abnormality, or hernia. Urinary bladder unremarkable. Pelvic calcifications consistent with venous phleboliths. No acute distal bowel process. Portions of the appendix are demonstrated across the right iliac vessels, images 59 and 60 without evidence of appendicitis. Right lower quadrant lobulated soft tissue overlies the right iliopsoas muscle, image 60. This measures 5.0 x 2.1 cm, image 60 and correlates with an enlarged right ovary when compared to prior contrast exam.  No acute osseous finding.  IMPRESSION: Distal stomach and diffuse duodenal slightly wall prominence, nonspecific but could represent mild gastritis/ duodenitis. No associated bowel obstruction, abscess or free air.  No acute obstructing urinary tract calculus or hydronephrosis.  Normal appendix.  Slightly smaller but still enlarged right ovary measuring 5 x 2.1 cm. This remains nonspecific. Consider  follow-up nonemergent pelvic ultrasound.   Electronically Signed   By: Daryll Brod M.D.   On: 05/11/2014 16:55   Dg Chest 2 View  05/11/2014   CLINICAL DATA:  Nausea, vomiting, diarrhea and headache.  EXAM: CHEST - 2 VIEW  COMPARISON:  CTA of the chest on 04/30/2011  FINDINGS: The heart size and mediastinal contours are within normal limits. There is no evidence of pulmonary edema, consolidation, pneumothorax, nodule or pleural fluid. The visualized skeletal structures are unremarkable.  IMPRESSION: No active disease.   Electronically Signed   By: Aletta Edouard M.D.   On: 05/11/2014 16:30   Ct Head Wo Contrast  06/01/2014   CLINICAL DATA:  49 year old female with altered mental status weakness nausea vomiting diarrhea and headache. Initial encounter.  EXAM: CT HEAD WITHOUT CONTRAST  TECHNIQUE: Contiguous axial images were obtained from the base of the skull through the vertex without intravenous contrast.  COMPARISON:  Normal noncontrast CT appearance of the brain. MRI 07/18/2011 and earlier.  FINDINGS: Visualized paranasal sinuses and mastoids are clear. No acute osseous abnormality identified. Visualized orbits and scalp soft tissues are within normal limits.  Cerebral volume is stable and within normal limits for age. No midline shift, ventriculomegaly, mass effect, evidence of mass lesion, intracranial hemorrhage or evidence of cortically based acute infarction. Gray-white matter differentiation is within normal limits throughout the brain. No suspicious intracranial vascular hyperdensity.  IMPRESSION: Stable and normal noncontrast CT appearance of the brain.   Electronically Signed   By: Genevie Ann M.D.   On: 06/01/2014 16:16   Nm Thyroid Sng Uptake W/imaging  06/03/2014   CLINICAL DATA:  Hyperthyroidism without crisis  EXAM: THYROID SCAN AND UPTAKE - 24 HOURS  TECHNIQUE: Following the per oral administration of I-131 sodium iodide, the patient returned at 24 hours and uptake measurements were acquired  with the uptake probe centered on the neck. Thyroid imaging was performed following the intravenous administration of the Tc-52m Pertechnetate.  RADIOPHARMACEUTICALS:  9 microCuries I-131 Sodium Iodide and 10 mCi TC-54m Pertechnetate  COMPARISON:  None  FINDINGS: 24 hour radio iodine uptake is calculated at 45%, above the normal range consistent with hyperthyroidism.  Imaging the thyroid gland in 3 projections demonstrates homogeneous tracer distribution throughout both lobes with a cold nodule at the inferior pole of the RIGHT lobe.  IMPRESSION: Elevated 24 hour radio iodine uptake of 45% consistent with hyperthyroidism.  Cold nodule at inferior pole RIGHT lobe; ultrasound assessment recommended prior to consideration of radioactive iodine ablation.   Electronically Signed   By: Lavonia Dana M.D.   On: 06/03/2014 13:13   US Soft Tissue Head/neck  06/03/2014   CLINICAL DATA:  Cold nodules seen on nuclear medicine study.  EXAM: THYROID ULTRASOUND  TECHNIQUE: Ultrasound examination of the thyroid gland and adjacent soft tissues was performed.  COMPARISON:  Nuclear medicine study of same day.  FINDINGS: Right thyroid lobe  Measurements: 4.8 x 1.8 x 1.6 cm. Solid nodule measuring 1.5 x 1.1 x 1.0 cm is noted in lower pole. Adjacent solid nodule measuring 1.4 x 1.1 x 1.0 cm is noted. These correspond in location to defect seen on nuclear medicine study.  Left thyroid lobe  Measurements: 5.0 x 2.1 x 1.3 cm.  No nodules visualized.  Isthmus  Thickness: 5 mm.  No nodules visualized.  Lymphadenopathy  None visualized.  IMPRESSION: Two solid nodules are noted in the inferior pole of the right thyroid lobe which correspond to cold defect seen on nuclear medicine study. The largest nodule measures 1.5 cm in diameter. Findings meet consensus criteria for biopsy. Ultrasound-guided fine needle aspiration should be considered, as per the consensus statement: Management of Thyroid Nodules Detected at [place]: Society of Radiologists in  [place]. Radiology 2005; [id number].   Electronically Signed   By: Marijo Conception, M.D.   On: 06/03/2014 16:56   US Abdomen Complete  06/02/2014   CLINICAL DATA:  History of pancreatitis, epigastric pain, some nausea  EXAM: ULTRASOUND ABDOMEN COMPLETE  COMPARISON:  Acute abdomen films of 06/01/2014 and CT abdomen pelvis of 05/23/2014  FINDINGS: Gallbladder: The gallbladder is visualized and no gallstones are noted. There is no pain over the gallbladder with compression  Common bile duct: Diameter: The common bile duct is normal measuring 4.7 mm.  Liver: The liver has a normal echogenic pattern. No focal hepatic abnormality is seen.  IVC: No abnormality visualized.  Pancreas: The pancreas is largely obscured by bowel gas and cannot be evaluated.  Spleen: The spleen is normal measuring 4.9 cm.  Right Kidney: Length: 11.3 cm.  No hydronephrosis is seen.  Left Kidney: Length: 11.0 cm.  No hydronephrosis is seen.  Abdominal aorta: The abdominal aorta is normal in caliber.  Other findings: None.  IMPRESSION: 1. No gallstones. 2. No abnormality of the liver is seen. 3. The pancreas is largely obscured by bowel gas.   Electronically Signed   By: Ivar Drape M.D.   On: 06/02/2014 14:05   US Pelvis Complete  06/03/2014   CLINICAL DATA:  Right ovarian cyst per CT.  EXAM: TRANSABDOMINAL ULTRASOUND OF PELVIS  TECHNIQUE: Transabdominal ultrasound examination of the pelvis was performed including evaluation of the uterus, ovaries, adnexal regions, and pelvic cul-de-sac.  COMPARISON:  CT 05/23/2014  FINDINGS: Uterus  Measurements: Prior hysterectomy.  Endometrium  Thickness: N/A.  Right ovary  Measurements: 4.6 x 2.3 x 1.6 cm. Normal appearance/no adnexal mass.  Left ovary  Measurements: Not visualized.  Other findings: No free fluid. Patient refused transvaginal imaging.  IMPRESSION: No adnexal masses seen.  Left ovary not visualized.  Prior hysterectomy.   Electronically Signed   By:  Rolm Baptise M.D.   On: 06/03/2014 15:35   US Abdomen Limited  05/23/2014   CLINICAL DATA:  49 year old female with subacute right upper quadrant pain, vomiting and weight loss. Initial encounter.  EXAM: US ABDOMEN LIMITED - RIGHT UPPER QUADRANT  COMPARISON:  05/11/2014 and prior CTs.  FINDINGS: Gallbladder:  The gallbladder is unremarkable. There is no evidence of cholelithiasis or acute cholecystitis.  Common bile duct:  Diameter: 3.0 mm. There is no evidence of intrahepatic or extrahepatic biliary dilatation.  Liver:  No focal lesion identified. Within normal limits in parenchymal echogenicity.  There is no evidence of ascites within the right upper abdomen.  IMPRESSION: Unremarkable right upper quadrant abdominal ultrasound.   Electronically Signed   By: Margarette Canada M.D.   On: 05/23/2014 13:21   US Thyroid Biopsy  06/05/2014   CLINICAL DATA:  RIGHT thyroid nodules  EXAM: ULTRASOUND GUIDED FINE NEEDLE ASPIRATE BIOPSY OF A RIGHT THYROID NODULE  MEDICATIONS: 3 mL of 1% lidocaine local anesthesia  PROCEDURE: Procedure, risks, benefits and alternatives discussed with the patient.  Patient's questions answered.  Written informed consent for thyroid nodule FNA obtained.  Time-out protocol followed.  Dominant nodule in inferior RIGHT thyroid lobe localized by ultrasound  Nodule measures 1.6 cm asthma diameter.  Skin prepped and draped in usual sterile fashion.  Skin and overlying soft tissues anesthetized with 3 mL of 1% lidocaine.  Under direct sonographic visualization, 4 fine-needle aspirates of the RIGHT lobe nodule were obtained.  Procedure tolerated well by patient.  Specimen sent to cytology for evaluation.  COMPLICATIONS: None  FINDINGS: As above  IMPRESSION: Ultrasound-guided FNA of an inferior pole RIGHT lobe thyroid nodule.   Electronically Signed   By: Lavonia Dana M.D.   On: 06/05/2014 08:11   Dg Chest Port 1 View  05/24/2014   CLINICAL DATA:  Feeling very weak para nausea and vomiting, diarrhea for  3 weeks.  EXAM: PORTABLE CHEST - 1 VIEW  COMPARISON:  05/11/2014  FINDINGS: Heart size is normal. Lungs are clear. No pulmonary edema. Visualized osseous structures have a normal appearance.  IMPRESSION: No active disease.   Electronically Signed   By: Nolon Nations M.D.   On: 05/24/2014 15:08   Dg Abd Acute W/chest  06/01/2014   CLINICAL DATA:  49 year old female with weakness.  EXAM: ACUTE ABDOMEN SERIES (ABDOMEN 2 VIEW & CHEST 1 VIEW)  COMPARISON:  Chest x-ray 05/24/2014. CT of the abdomen and pelvis 05/23/2014.  FINDINGS: Lung volumes are normal. No consolidative airspace disease. No pleural effusions. No pneumothorax. No pulmonary nodule or mass noted. Pulmonary vasculature and the cardiomediastinal silhouette are within normal limits.  Gas and stool are seen scattered throughout the colon extending to the level of the distal rectum. No pathologic distension of small bowel is noted. No gross evidence of pneumoperitoneum.  IMPRESSION: 1.  Nonobstructive bowel gas pattern. 2. No pneumoperitoneum. 3. No radiographic evidence of acute cardiopulmonary disease.   Electronically Signed   By: Vinnie Langton M.D.   On: 06/01/2014 16:38                      Results for EBELIN, DILLEHAY (MRN [id number]) as of 06/09/2014 12:44  Ref. Range 06/01/2014 15:33  TSH Latest Range: 0.350-4.500 uIU/mL 0.011 (L)  Free T4 Latest Range: 0.80-1.80 ng/dL 8.64 (H)    Impression: Severe hyperthyroidism Recurrent nausea/Vomiting  Active Problems:   * No active hospital problems. *     Plan:  She has confirmed severe hyperthyroidism, her thyroid hormones are high and thyroid uptake was 45 %. She was in preparation for definitive ablative therapy with radioactive iodine this week. However, she deteriorated clinically from intractable nausea/vomiting . Even though her clinical presentation is atypical for hyperthyroidism, she has  signs and symptoms suspicious for  thyroid storm. Thyroid storm is rare and a  clinical diagnosis of exclusion. She has estimated score of 60 on thyroid storm diagnostic criteria. ( >45 highly suggestive). Not to miss a chance, she will  need a quick  antithyroid therapy.  I will follow the thyroid storm protocol. Pt needs to be in ICU for  cardiac monitoring. She will receive beta blocker, thionamide with high dose PTU, and high dose steroid therapy. She has a documented allergy to iodinated contrast, hence will avoid potassium iodide for now. She will need dextrose support as she is unable to tolerate oral feeding, oxygen as needed. Close follow up and monitoring required. She will probably need further investigation for the severe and intractable nausea/vomiting. I will follow with you.   Loni Beckwith, MD 06/09/2014, 12:19 PM

## 2014-06-09 NOTE — ED Notes (Signed)
EMS administered 4mg  zofran IV and fluid bolus.

## 2014-06-09 NOTE — H&P (Signed)
[facility] History and Physical  [name] FIE:[id number] DOB: 1966-01-15 DOA: 06/09/2014  Referring physician: Dr. Eulis Foster PCP: No primary care provider on file.   Chief Complaint: nausea and vomiting  HPI: [name] is a 49 y.o. female with a history of hyperthyroidism was followed by endocrinology, has had recurrent admissions for nausea, vomiting and diarrhea. Patient was recently discharged from hospital approximately 5 days ago after being treated for a urinary tract infection, nausea and vomiting. She had a thyroid biopsy done at that time for hyperthyroidism and plans were to start treatment for hyperthyroidism. The patient reported that she didn't fairly well on the first and second day after returning home. On the third day, patient began to develop significant nausea. The following day the patient began to develop vomiting and has been unable to keep any food or liquid down for the past 2 days. She followed up with her endocrinologist today and was noted to be hypotensive and tachycardic. She was sent to the ER for evaluation. Endocrinology has seen the patient and the emergency room feels that she may be in thyroid storm. They have requested admission to the ICU under the hospitalist service.   Review of Systems:  Pertinent positives as per HPI, otherwise negative.   Past Medical History  Diagnosis Date  . Hypertension   . Asthma   . Hyperthyroidism   . Enlarged ovary     right CT 05/2014   Past Surgical History  Procedure Laterality Date  . Abdominal hysterectomy    . Ovarian cyst surgery    . Esophagogastroduodenoscopy N/A 05/26/2014    Procedure: ESOPHAGOGASTRODUODENOSCOPY (EGD);  Surgeon: Danie Binder, MD;  Location: [facility];  Service: Endoscopy;  Laterality: N/A;  . Balloon dilation N/A 05/26/2014    Procedure: BALLOON DILATION OF THE PYLORUS;  Surgeon: Danie Binder, MD;  Location: [facility];  Service: Endoscopy;  Laterality: N/A;   Social  History:  reports that she has never smoked. She does not have any smokeless tobacco history on file. She reports that she does not drink alcohol or use illicit drugs.  Allergies  Allergen Reactions  . Contrast Media [Iodinated Diagnostic Agents] Rash  . Iohexol Nausea And Vomiting    Pt. Vomited immediately after injection of Omni 350.    . Vicodin [Hydrocodone-Acetaminophen] Rash    Family History  Problem Relation Age of Onset  . Diabetes Mother   . Diabetes Father   . Hypertension Mother   . Hypertension Father     Prior to Admission medications   Medication Sig Start Date End Date Taking? Authorizing Provider  hydrochlorothiazide (HYDRODIURIL) 25 MG tablet Take 25 mg by mouth daily.   Yes Historical Provider, MD  methimazole (TAPAZOLE) 10 MG tablet Take 10 mg by mouth daily.   Yes Historical Provider, MD  Multiple Vitamin (MULITIVITAMIN WITH MINERALS) TABS Take 1 tablet by mouth daily.   Yes Historical Provider, MD  nitrofurantoin, macrocrystal-monohydrate, (MACROBID) 100 MG capsule Take 1 capsule (100 mg total) by mouth 2 (two) times daily. Patient taking differently: Take 100 mg by mouth 2 (two) times daily. For 5 days 06/05/14  Yes Erline Hau, MD  ondansetron (ZOFRAN ODT) 4 MG disintegrating tablet Take 1 tablet (4 mg total) by mouth every 8 (eight) hours as needed for nausea or vomiting. 06/05/14  Yes Erline Hau, MD  pantoprazole (PROTONIX) 40 MG tablet Take 1 tablet (40 mg total) by mouth 2 (two) times daily before a meal.  05/28/14  Yes Radene Gunning, NP  promethazine (PHENERGAN) 25 MG suppository Place 25 mg rectally every 8 (eight) hours as needed for nausea or vomiting.  06/01/14  Yes Historical Provider, MD  promethazine (PHENERGAN) 25 MG tablet Take 25 mg by mouth every 6 (six) hours as needed for nausea or vomiting.   Yes Historical Provider, MD  propranolol (INDERAL) 20 MG tablet Take 20 mg by mouth 2 (two) times daily.   Yes Historical  Provider, MD  oxyCODONE (OXY IR/ROXICODONE) 5 MG immediate release tablet Take 1 tablet (5 mg total) by mouth every 4 (four) hours as needed for severe pain. Patient not taking: Reported on 06/09/2014 06/05/14   Erline Hau, MD   Physical Exam: Filed Vitals:   06/09/14 1500 06/09/14 1600 06/09/14 1641 06/09/14 1700  BP: 122/76  119/67 112/63  Pulse: 117  110 110  Temp:  98.9 F (37.2 C)    TempSrc:  Oral    Resp: 30   27  Height:      Weight:      SpO2: 95%   100%    Wt Readings from Last 3 Encounters:  06/09/14 49.896 kg (110 lb)  06/01/14 57.7 kg (127 lb 3.3 oz)  05/23/14 58.3 kg (128 lb 8.5 oz)    General:  Appears calm and comfortable Eyes: PERRL, normal lids, irises & conjunctiva ENT: grossly normal hearing, lips & tongue Cardiovascular: s1, s2, tachycardic, no m/r/g. No LE edema. Telemetry: sinus tachycardia Respiratory: CTA bilaterally, no w/r/r. Normal respiratory effort. Abdomen: soft, tender in lower abdomen bilaterally, bs+ Skin: no rash or induration seen on limited exam Musculoskeletal: grossly normal tone BUE/BLE Psychiatric: patient does not really engage in conversation, affect is flat Neurologic: grossly non-focal.          Labs on Admission:  Basic Metabolic Panel:  Recent Labs Lab 06/03/14 0552 06/04/14 0545 06/09/14 1114  NA 138 140 133*  K 3.2* 3.6 3.2*  CL 106 107 98  CO2 26 22 20   GLUCOSE 94 74 116*  BUN 8 7 20   CREATININE 0.42* 0.46* 0.71  CALCIUM 9.2 9.5 10.7*   Liver Function Tests:  Recent Labs Lab 06/04/14 0545 06/09/14 1114  AST 21 34  ALT 18 25  ALKPHOS 41 58  BILITOT 0.8 0.8  PROT 6.3 7.8  ALBUMIN 3.0* 3.7    Recent Labs Lab 06/04/14 0545  LIPASE 118*   No results for input(s): AMMONIA in the last 168 hours. CBC:  Recent Labs Lab 06/03/14 0552 06/04/14 0545 06/09/14 1114  WBC 6.0 5.3 5.6  NEUTROABS  --   --  2.2  HGB 9.3* 9.9* 12.5  HCT 28.1* 29.9* 37.7  MCV 85.4 85.9 86.7  PLT 197 181 286    Cardiac Enzymes:  Recent Labs Lab 06/09/14 1114  TROPONINI <0.03    BNP (last 3 results)  Recent Labs  06/09/14 1114  BNP 5.0    ProBNP (last 3 results) No results for input(s): PROBNP in the last 8760 hours.  CBG: No results for input(s): GLUCAP in the last 168 hours.  Radiological Exams on Admission: Dg Chest Port 1 View  06/09/2014   CLINICAL DATA:  Nausea, vomiting, abdominal pain  EXAM: PORTABLE CHEST - 1 VIEW  COMPARISON:  06/01/2014  FINDINGS: Cardiomediastinal silhouette is stable. No acute infiltrate or pleural effusion. No pulmonary edema. Bony thorax is unremarkable.  IMPRESSION: No active disease.   Electronically Signed   By: Lahoma Crocker M.D.   On:  06/09/2014 16:09   Dg Abd Portable 1v  06/09/2014   CLINICAL DATA:  Nausea, vomiting, abdominal pain  EXAM: PORTABLE ABDOMEN - 1 VIEW  COMPARISON:  06/01/2014  FINDINGS: Mild gaseous distended small bowel loops are noted mid abdomen without air-fluid levels. Ileus or enteritis cannot be excluded. No free abdominal air is noted.  IMPRESSION: Mild gaseous distended small bowel loops mid abdomen without air-fluid levels. Ileus or enteritis cannot be excluded. Clinical correlation is necessary. No free abdominal air.   Electronically Signed   By: Lahoma Crocker M.D.   On: 06/09/2014 16:10    EKG: Independently reviewed. Sinus tachycardia  Assessment/Plan Principal Problem:   Hyperthyroidism Active Problems:   Essential hypertension   Tachycardia   Dehydration   Nausea with vomiting   Abdominal pain   Generalized weakness   Thyroid storm   1. 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 Endocrinology is following the patient. She's been started on beta blockers, intravenous steroids, thionamide and high dose PTU. We'll continue to monitor in the ICU. She'll be continued on intravenous hydration. 2. Dehydration. Likely related to persistent nausea and vomiting. Continue with IV fluids 3. Nausea and vomiting. Etiology is  not entirely clear. The patient had extensive workups including CT of the abdomen and pelvis, endoscopies. I suspect that her symptoms may be related to her hyperthyroidism. We'll continue with supportive measures for now, but if her symptoms do not resolve would consider calling gastroenterology. 4. Hypertension. Appears to be stable at this time. Continue current management. 5. Tachycardia. Related to volume depletion/hyperthyroid. Continue with rehydration 6. Abdominal pain. Appears to be a somewhat chronic issue. She's had multiple CT scans as well as endoscopy. Will treat supportively at this time. Check urine pregnancy test 7. Recent urinary tract infection. Was discharged home on Macrodantin. Repeat urinalysis is unremarkable. We'll hold further antibiotics and continue to monitor. 8. Hypokalemia. Replace   Code Status: full code DVT Prophylaxis: lovenox Family Communication: discussed with husband at the bedside Disposition Plan: discharge home once improved  Time spent: 29mins  Dawson,Joanna [facility] Pager [phone]

## 2014-06-09 NOTE — ED Provider Notes (Signed)
CSN: [id number]     Arrival date & time 06/09/14  1107 History  This chart was scribe for Joanna Blade, MD by Judithann Sauger, ED Scribe. The patient was seen in room APA01/[facility]01 and the patient's care was started at 11:08 AM.    Chief Complaint  Patient presents with  . Hypotension   The history is provided by the patient. No language interpreter was used.   HPI Comments: [name] is a 49 y.o. female brought in by ambulance, who presents to the Emergency Department from Dr. Liliane Channel office. She has been having recurrent N/V for about 6 weeks now.Dr. Dorris Fetch reports that she could be in a thyroid storm and she is currently hypotensive. The pt denies diarrhea, fever, and eating or drinking anything today. She reports that she was able to eat and drink last a couple of days ago.   Past Medical History  Diagnosis Date  . Hypertension   . Asthma   . Hyperthyroidism   . Enlarged ovary     right CT 05/2014   Past Surgical History  Procedure Laterality Date  . Abdominal hysterectomy    . Ovarian cyst surgery    . Esophagogastroduodenoscopy N/A 05/26/2014    Procedure: ESOPHAGOGASTRODUODENOSCOPY (EGD);  Surgeon: Danie Binder, MD;  Location: [facility];  Service: Endoscopy;  Laterality: N/A;  . Balloon dilation N/A 05/26/2014    Procedure: BALLOON DILATION OF THE PYLORUS;  Surgeon: Danie Binder, MD;  Location: [facility];  Service: Endoscopy;  Laterality: N/A;   Family History  Problem Relation Age of Onset  . Diabetes Mother   . Diabetes Father   . Hypertension Mother   . Hypertension Father    History  Substance Use Topics  . Smoking status: Never Smoker   . Smokeless tobacco: Not on file  . Alcohol Use: No   OB History    No data available     Review of Systems  Constitutional: Positive for appetite change.  Gastrointestinal: Positive for nausea and vomiting. Negative for diarrhea.  All other systems reviewed and are negative.     Allergies  Contrast media;  Iohexol; and Vicodin  Home Medications   Prior to Admission medications   Medication Sig Start Date End Date Taking? Authorizing Provider  methimazole (TAPAZOLE) 10 MG tablet Take 10 mg by mouth daily.    Historical Provider, MD  Multiple Vitamin (MULITIVITAMIN WITH MINERALS) TABS Take 1 tablet by mouth daily.    Historical Provider, MD  nitrofurantoin, macrocrystal-monohydrate, (MACROBID) 100 MG capsule Take 1 capsule (100 mg total) by mouth 2 (two) times daily. 06/05/14   Erline Hau, MD  ondansetron (ZOFRAN ODT) 4 MG disintegrating tablet Take 1 tablet (4 mg total) by mouth every 8 (eight) hours as needed for nausea or vomiting. 06/05/14   Erline Hau, MD  oxyCODONE (OXY IR/ROXICODONE) 5 MG immediate release tablet Take 1 tablet (5 mg total) by mouth every 4 (four) hours as needed for severe pain. 06/05/14   Erline Hau, MD  pantoprazole (PROTONIX) 40 MG tablet Take 1 tablet (40 mg total) by mouth 2 (two) times daily before a meal. 05/28/14   Radene Gunning, NP  promethazine (PHENERGAN) 25 MG tablet Take 25 mg by mouth every 6 (six) hours as needed for nausea or vomiting.    Historical Provider, MD  propranolol (INDERAL) 20 MG tablet Take 20 mg by mouth 2 (two) times daily.    Historical Provider, MD  BP 123/74 mmHg  Pulse 121  Temp(Src) 98.5 F (36.9 C) (Oral)  Resp 14  Ht 5\' 4"  (1.626 m)  Wt 110 lb (49.896 kg)  BMI 18.87 kg/m2  SpO2 97% Physical Exam  Constitutional: She is oriented to person, place, and time. She appears well-developed and well-nourished.  Anxious- flattering eyelids  HENT:  Head: Normocephalic and atraumatic.  Eyes: Conjunctivae and EOM are normal. Pupils are equal, round, and reactive to light.  Neck: Normal range of motion and phonation normal. Neck supple.  Cardiovascular: Normal rate and regular rhythm.   Pulmonary/Chest: Effort normal and breath sounds normal. She exhibits no tenderness.  Abdominal: Soft. She exhibits  no distension. There is no tenderness. There is no guarding.  Musculoskeletal: Normal range of motion.  Neurological: She is alert and oriented to person, place, and time. She exhibits normal muscle tone.  Skin: Skin is warm and dry.  Psychiatric: She has a normal mood and affect. Her behavior is normal. Judgment and thought content normal.  Nursing note and vitals reviewed.   ED Course  Procedures (including critical care time)  Medications  hydrocortisone sodium succinate (SOLU-CORTEF) 100 MG injection 60 mg (60 mg Intravenous Given 06/09/14 1141)  propranolol (INDERAL) injection 1 mg (not administered)  propylthiouracil (PTU) tablet 150 mg (not administered)  propranolol (INDERAL) tablet 40 mg (not administered)  dextrose 5 %-0.9 % sodium chloride infusion (not administered)  sodium chloride 0.9 % bolus 1,000 mL (1,000 mLs Intravenous New Bag/Given 06/09/14 1118)    Patient Vitals for the past 24 hrs:  BP Temp Temp src Pulse Resp SpO2 Height Weight  06/09/14 1305 123/74 mmHg - - (!) 121 14 97 % - -  06/09/14 1130 113/77 mmHg - - 113 (!) 27 100 % - -  06/09/14 1120 - - - - - 100 % - -  06/09/14 1111 135/94 mmHg 98.5 F (36.9 C) Oral (!) 123 22 100 % 5\' 4"  (1.626 m) 110 lb (49.896 kg)   Dr. Dorris Fetch, saw the patient in the office, then came to see her here in the ED, and wrote orders to treat for thyroid storm   1:19 PM Reevaluation with update and discussion. After initial assessment and treatment, an updated evaluation reveals she feels better at this time. She is not actively vomiting. Her partner is with her, and states that she has been having trouble with her hands locking up. He also states that she has not tolerated the Macrodantin that she was prescribed discharge 5 days ago, history for UTI. Findings discussed with patient and partner, all questions answered. Contina Strain L   1:23 PM-Consult complete with hospitalist, Dr. Roderic Palau. Patient case explained and discussed. He agrees to  admit patient for further evaluation and treatment. Call ended at 13:50   CRITICAL CARE Performed by: [name] Total critical care time: 35 Critical care time was exclusive of separately billable procedures and treating other patients. Critical care was necessary to treat or prevent imminent or life-threatening deterioration. Critical care was time spent personally by me on the following activities: development of treatment plan with patient and/or surrogate as well as nursing, discussions with consultants, evaluation of patient's response to treatment, examination of patient, obtaining history from patient or surrogate, ordering and performing treatments and interventions, ordering and review of laboratory studies, ordering and review of radiographic studies, pulse oximetry and re-evaluation of patient's condition.'  DIAGNOSTIC STUDIES: Oxygen Saturation is 100% on RA, normal by my interpretation.    COORDINATION OF CARE: Medications  hydrocortisone sodium succinate (SOLU-CORTEF) 100 MG injection 60 mg (60 mg Intravenous Given 06/09/14 1141)  propranolol (INDERAL) injection 1 mg (not administered)  propylthiouracil (PTU) tablet 150 mg (not administered)  propranolol (INDERAL) tablet 40 mg (not administered)  dextrose 5 %-0.9 % sodium chloride infusion (not administered)  sodium chloride 0.9 % bolus 1,000 mL (1,000 mLs Intravenous New Bag/Given 06/09/14 1118)    Patient Vitals for the past 24 hrs:  BP Temp Temp src Pulse Resp SpO2 Height Weight  06/09/14 1305 123/74 mmHg - - (!) 121 14 97 % - -  06/09/14 1130 113/77 mmHg - - 113 (!) 27 100 % - -  06/09/14 1120 - - - - - 100 % - -  06/09/14 1111 135/94 mmHg 98.5 F (36.9 C) Oral (!) 123 22 100 % 5\' 4"  (1.626 m) 110 lb (49.896 kg)   11:12 AM- Pt advised of plan for treatment and pt agrees.    Labs Review Labs Reviewed  COMPREHENSIVE METABOLIC PANEL - Abnormal; Notable for the following:    Sodium 133 (*)    Potassium 3.2 (*)     Glucose, Bld 116 (*)    Calcium 10.7 (*)    All other components within normal limits  CBC WITH DIFFERENTIAL/PLATELET - Abnormal; Notable for the following:    Neutrophils Relative % 39 (*)    Lymphocytes Relative 49 (*)    All other components within normal limits  URINALYSIS, ROUTINE W REFLEX MICROSCOPIC - Abnormal; Notable for the following:    Specific Gravity, Urine >1.030 (*)    Hgb urine dipstick TRACE (*)    Bilirubin Urine MODERATE (*)    Ketones, ur >80 (*)    Protein, ur 30 (*)    All other components within normal limits  URINE MICROSCOPIC-ADD ON - Abnormal; Notable for the following:    Squamous Epithelial / LPF MANY (*)    Bacteria, UA MANY (*)    All other components within normal limits  URINE CULTURE  DRUG SCREEN PANEL (SERUM)    Imaging Review No results found.   EKG Interpretation   Date/Time:  Tuesday June 09 2014 11:15:17 EST Ventricular Rate:  116 PR Interval:  126 QRS Duration: 83 QT Interval:  329 QTC Calculation: 457 R Axis:   54 Text Interpretation:  Sinus tachycardia Consider left ventricular  hypertrophy since last tracing no significant change Confirmed by Braylyn Eye   MD, Vira Agar ([id number]) on 06/09/2014 12:03:41 PM      MDM   Final diagnoses:  Hyperthyroidism  Non-intractable vomiting with nausea, vomiting of unspecified type     Recurrent nausea and vomiting, with hyperthyroidism. She had recent thyroid biopsy which was negative for malignancy. Consideration has been given to thyroid ablation, but she is unable to receive iodine due to an allergy. She is being treated for thyroid storm in the ED, by her endocrinologist. She will require admission for observation and additional treatment. Urinalysis is nondiagnostic. Urine culture is pending.  Nursing Notes Reviewed/ Care Coordinated, and agree without changes. Applicable Imaging Reviewed.  Interpretation of Laboratory Data incorporated into ED treatment   I personally performed the  services described in this documentation, which was scribed in my presence. The recorded information has been reviewed and is accurate.      Joanna Blade, MD 06/09/14 [phone]

## 2014-06-09 NOTE — ED Notes (Signed)
EMS reports pt came from PCP's office this morning, n/v, decreased loc, and hypotensive.

## 2014-06-10 DIAGNOSIS — E876 Hypokalemia: Secondary | ICD-10-CM | POA: Diagnosis present

## 2014-06-10 DIAGNOSIS — T50905A Adverse effect of unspecified drugs, medicaments and biological substances, initial encounter: Secondary | ICD-10-CM | POA: Diagnosis present

## 2014-06-10 DIAGNOSIS — R739 Hyperglycemia, unspecified: Secondary | ICD-10-CM | POA: Diagnosis present

## 2014-06-10 DIAGNOSIS — D6489 Other specified anemias: Secondary | ICD-10-CM

## 2014-06-10 DIAGNOSIS — E0581 Other thyrotoxicosis with thyrotoxic crisis or storm: Secondary | ICD-10-CM

## 2014-06-10 LAB — FOLATE: Folate: >20 ng/mL

## 2014-06-10 LAB — URINE CULTURE: Colony Count: >=100000

## 2014-06-10 LAB — GLUCOSE, CAPILLARY: Glucose-Capillary: 174 mg/dL — ABNORMAL HIGH (ref 70–99)

## 2014-06-10 LAB — TROPONIN I

## 2014-06-10 LAB — CBC
HEMATOCRIT: 27.5 % — AB (ref 36.0–46.0)
Hemoglobin: 9.1 g/dL — ABNORMAL LOW (ref 12.0–15.0)
MCH: 28.5 pg (ref 26.0–34.0)
MCHC: 33.1 g/dL (ref 30.0–36.0)
MCV: 86.2 fL (ref 78.0–100.0)
PLATELETS: 207 10*3/uL (ref 150–400)
RBC: 3.19 MIL/uL — ABNORMAL LOW (ref 3.87–5.11)
RDW: 11.5 % (ref 11.5–15.5)
WBC: 4.5 10*3/uL (ref 4.0–10.5)

## 2014-06-10 LAB — BASIC METABOLIC PANEL
Anion gap: 4 — ABNORMAL LOW (ref 5–15)
BUN: 13 mg/dL (ref 6–23)
CHLORIDE: 113 mmol/L — AB (ref 96–112)
CO2: 23 mmol/L (ref 19–32)
CREATININE: 0.49 mg/dL — AB (ref 0.50–1.10)
Calcium: 9.1 mg/dL (ref 8.4–10.5)
GFR calc Af Amer: >90 mL/min (ref >90)
GFR calc non Af Amer: >90 mL/min (ref >90)
Glucose, Bld: 177 mg/dL — ABNORMAL HIGH (ref 70–99)
POTASSIUM: 4.6 mmol/L (ref 3.5–5.1)
Sodium: 140 mmol/L (ref 135–145)

## 2014-06-10 LAB — VITAMIN B12: VITAMIN B 12: 396 pg/mL (ref 211–911)

## 2014-06-10 LAB — IRON AND TIBC
Iron: 165 ug/dL — ABNORMAL HIGH (ref 42–145)
SATURATION RATIOS: 75 % — AB (ref 20–55)
TIBC: 221 ug/dL — ABNORMAL LOW (ref 250–470)
UIBC: 56 ug/dL — ABNORMAL LOW (ref 125–400)

## 2014-06-10 LAB — RETICULOCYTES
RBC.: 3.58 MIL/uL — AB (ref 3.87–5.11)
RETIC COUNT ABSOLUTE: 46.5 10*3/uL (ref 19.0–186.0)
Retic Ct Pct: 1.3 % (ref 0.4–3.1)

## 2014-06-10 LAB — MRSA PCR SCREENING: MRSA BY PCR: NEGATIVE

## 2014-06-10 LAB — LIPASE, BLOOD: Lipase: 97 U/L — ABNORMAL HIGH (ref 11–59)

## 2014-06-10 LAB — FERRITIN: Ferritin: 692 ng/mL — ABNORMAL HIGH (ref 10–291)

## 2014-06-10 MED ORDER — INSULIN DETEMIR 100 UNIT/ML ~~LOC~~ SOLN
5.0000 [IU] | Freq: Two times a day (BID) | SUBCUTANEOUS | Status: DC
Start: 2014-06-10 — End: 2014-06-10
  Administered 2014-06-10: 5 [IU] via SUBCUTANEOUS
  Filled 2014-06-10 (×3): qty 0.05

## 2014-06-10 MED ORDER — PANTOPRAZOLE SODIUM 40 MG PO TBEC
40.0000 mg | DELAYED_RELEASE_TABLET | Freq: Every day | ORAL | Status: DC
  Administered 2014-06-10 – 2014-06-15 (×6): 40 mg via ORAL
  Filled 2014-06-10 (×6): qty 1

## 2014-06-10 MED ORDER — INSULIN ASPART 100 UNIT/ML ~~LOC~~ SOLN: 0.0000 [IU] | Freq: Three times a day (TID) | SUBCUTANEOUS | Status: DC

## 2014-06-10 MED ORDER — SODIUM CHLORIDE 0.9 % IV SOLN
INTRAVENOUS | Status: DC
  Administered 2014-06-10 – 2014-06-11 (×3): via INTRAVENOUS

## 2014-06-10 MED ORDER — ENSURE COMPLETE PO LIQD
237.0000 mL | Freq: Two times a day (BID) | ORAL | Status: DC
  Administered 2014-06-10 – 2014-06-15 (×11): 237 mL via ORAL

## 2014-06-10 MED ORDER — INSULIN ASPART 100 UNIT/ML ~~LOC~~ SOLN: 0.0000 [IU] | Freq: Every day | SUBCUTANEOUS | Status: DC

## 2014-06-10 NOTE — Progress Notes (Signed)
[facility] PROGRESS NOTE  [name] MLY:[id number] DOB: 1965-04-26 DOA: 06/09/2014 PCP: No primary care provider on file.    Code Status: Full code Family Communication: Family not available; discussed with patient Disposition Plan: Discharge to home when clinically appropriate   Consultants:  Endocrinology, Dr. Dorris Fetch  Procedures:  None  Antibiotics:  None  HPI/Subjective: Patient denies vomiting and her nausea has subsided. Her abdominal pain has subsided. She denies pain with urination. She has no chest pain or shortness of breath. She wants to try solid foods.  Objective: Filed Vitals:   06/10/14 0600  BP: 111/53  Pulse: 83  Temp:   Resp: 24   temperature 97.7. Oxygen saturation 100% on room air.  Intake/Output Summary (Last 24 hours) at 06/10/14 0811 Last data filed at 06/10/14 0600  Gross per 24 hour  Intake 1578.33 ml  Output    750 ml  Net 828.33 ml   Filed Weights   06/09/14 1111 06/10/14 0500  Weight: 49.896 kg (110 lb) 55.3 kg (121 lb 14.6 oz)    Exam:   General:  49 year old African-American woman lying in bed, in no acute distress.  Cardiovascular: S1, S2, no murmurs rubs or gallops.  Respiratory: Clear to auscultation bilaterally.  Abdomen: Positive bowel sounds, soft, mildly tender generally, no distention, masses, or rigidity.  Musculoskeletal: No acute hot red joints. No pedal edema.   Data Reviewed: Basic Metabolic Panel:  Recent Labs Lab 06/04/14 0545 06/09/14 1114 06/10/14 0306  NA 140 133* 140  K 3.6 3.2* 4.6  CL 107 98 113*  CO2 22 20 23   GLUCOSE 74 116* 177*  BUN 7 20 13   CREATININE 0.46* 0.71 0.49*  CALCIUM 9.5 10.7* 9.1   Liver Function Tests:  Recent Labs Lab 06/04/14 0545 06/09/14 1114  AST 21 34  ALT 18 25  ALKPHOS 41 58  BILITOT 0.8 0.8  PROT 6.3 7.8  ALBUMIN 3.0* 3.7    Recent Labs Lab 06/04/14 0545  LIPASE 118*   No results for input(s): AMMONIA in the last 168 hours. CBC:  Recent  Labs Lab 06/04/14 0545 06/09/14 1114 06/10/14 0306  WBC 5.3 5.6 4.5  NEUTROABS  --  2.2  --   HGB 9.9* 12.5 9.1*  HCT 29.9* 37.7 27.5*  MCV 85.9 86.7 86.2  PLT 181 286 207   Cardiac Enzymes:  Recent Labs Lab 06/09/14 1114 06/09/14 2107 06/10/14 0306  TROPONINI <0.03 <0.03 <0.03   BNP (last 3 results)  Recent Labs  06/09/14 1114  BNP 5.0    ProBNP (last 3 results) No results for input(s): PROBNP in the last 8760 hours.  CBG: No results for input(s): GLUCAP in the last 168 hours.  Recent Results (from the past 240 hour(s))  Culture, Urine     Status: None   Collection Time: 06/02/14 11:20 AM  Result Value Ref Range Status   Specimen Description URINE, CLEAN CATCH  Final   Special Requests NONE  Final   Colony Count   Final    >=100,000 COLONIES/ML Performed at [facility]    Culture   Final    STAPHYLOCOCCUS SPECIES (COAGULASE NEGATIVE) Note: RIFAMPIN AND GENTAMICIN SHOULD NOT BE USED AS SINGLE DRUGS FOR TREATMENT OF STAPH INFECTIONS. Performed at [facility]    Report Status 06/04/2014 FINAL  Final   Organism ID, Bacteria STAPHYLOCOCCUS SPECIES (COAGULASE NEGATIVE)  Final      Susceptibility   Staphylococcus species (coagulase negative) - MIC*    GENTAMICIN >=16 RESISTANT  Resistant     LEVOFLOXACIN >=8 RESISTANT Resistant     NITROFURANTOIN <=16 SENSITIVE Sensitive     OXACILLIN >=4 RESISTANT Resistant     PENICILLIN >=0.5 RESISTANT Resistant     RIFAMPIN <=0.5 SENSITIVE Sensitive     TRIMETH/SULFA 160 RESISTANT Resistant     VANCOMYCIN 2 SENSITIVE Sensitive     TETRACYCLINE 2 SENSITIVE Sensitive     * STAPHYLOCOCCUS SPECIES (COAGULASE NEGATIVE)  H.pylori Antigen, Stool     Status: None   Collection Time: 06/03/14  2:00 PM  Result Value Ref Range Status   Specimen Description STOOL  Final   Special Requests NONE  Final   H. pylori ag, stool POSITIVE Performed at [facility]   Final   Report Status 06/06/2014 FINAL   Final  MRSA PCR Screening     Status: None   Collection Time: 06/09/14  3:22 PM  Result Value Ref Range Status   MRSA by PCR NEGATIVE NEGATIVE Final    Comment:        The GeneXpert MRSA Assay (FDA approved for NASAL specimens only), is one component of a comprehensive MRSA colonization surveillance program. It is not intended to diagnose MRSA infection nor to guide or monitor treatment for MRSA infections.      Studies: Dg Chest Port 1 View  06/09/2014   CLINICAL DATA:  Nausea, vomiting, abdominal pain  EXAM: PORTABLE CHEST - 1 VIEW  COMPARISON:  06/01/2014  FINDINGS: Cardiomediastinal silhouette is stable. No acute infiltrate or pleural effusion. No pulmonary edema. Bony thorax is unremarkable.  IMPRESSION: No active disease.   Electronically Signed   By: Lahoma Crocker M.D.   On: 06/09/2014 16:09   Dg Abd Portable 1v  06/09/2014   CLINICAL DATA:  Nausea, vomiting, abdominal pain  EXAM: PORTABLE ABDOMEN - 1 VIEW  COMPARISON:  06/01/2014  FINDINGS: Mild gaseous distended small bowel loops are noted mid abdomen without air-fluid levels. Ileus or enteritis cannot be excluded. No free abdominal air is noted.  IMPRESSION: Mild gaseous distended small bowel loops mid abdomen without air-fluid levels. Ileus or enteritis cannot be excluded. Clinical correlation is necessary. No free abdominal air.   Electronically Signed   By: Lahoma Crocker M.D.   On: 06/09/2014 16:10    Scheduled Meds: . enoxaparin (LOVENOX) injection  40 mg Subcutaneous Q24H  . feeding supplement (RESOURCE BREEZE)  1 Container Oral TID BM  . hydrocortisone sod succinate (SOLU-CORTEF) inj  60 mg Intravenous Q6H  . propranolol  40 mg Oral QID  . propylthiouracil  150 mg Oral Q6H   Continuous Infusions: . dextrose 5 % and 0.9 % NaCl with KCl 40 mEq/L 100 mL/hr at 06/10/14 0600    Principal Problem:   Nausea with vomiting Active Problems:   Hyperthyroidism   Thyroid storm   Essential hypertension   Abdominal pain    Tachycardia   Dehydration   Generalized weakness   Malnutrition of moderate degree   Hyperglycemia, drug-induced   Anemia due to other cause   Hypokalemia   1. Recurrent nausea, vomiting, and abdominal pain. Etiology not quite clear, but hyperthyroidism could be playing a part. Previous hospitalization noted for acute pancreatitis. Will order a lipase level this morning. Patient has had extensive recent workup with an EGD on 05/26/14 revealing mild nonerosive gastritis and possible pyloric stenosis, status post dilatation. Stool antigen was H. pylori positive during previous admission. Not sure if treatment will be indicated at this time given her recurrent nausea and vomiting-treatment  may exacerbate symptoms, but will add Protonix daily. Continue Zofran as needed. CT scan of the abdomen and pelvis on 05/23/14 revealed no definite acute abnormalities Ultrasound of the abdomen on 05/23/14 was unremarkable. Recent UTI was treated briefly with Rocephin. Current UA is not sterile and she has no complaints of dysuria, so will not start an antibiotic. --We'll order urine pregnancy test -We'll advance diet to heart healthy.  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 Status post thyroid biopsy on 06/05/14 with results revealing benign thyroid tissue. Dr. Dorris Fetch was consulted and is following. Per his recommendations, the patient is being treated with IV Solu-Cortef, thionamide, and PTU and propanolol. Patient's heart rate and blood pressure are much improved. Continue current treatment or per Dr. Dorris Fetch.  Hypertension and tachycardia, in part secondary to hyperthyroidism. Currently improved on propanolol.  Recurrent hypokalemia. Status post potassium chloride repletion and IV fluids. Currently resolved. We'll discontinue both potassium chloride from the IV fluids. We'll check a magnesium level tomorrow morning.  Anemia. Etiology unknown. We'll order an anemia panel and  guaiac stools. Patient noted for history of nonerosive gastritis, but doubt that this is to etiology. Protonix ordered.  Hyperglycemia. Likely steroid-induced and from dextrose and IV fluids. We'll discontinue dextrose and IV fluids. We'll taper IV Solu-Cortef per Dr. Dorris Fetch. Will start sliding scale NovoLog and Levemir.        Time spent: 35 minutes    [facility] Pager [phone]. If 7PM-7AM, please contact night-coverage at [email or web address], password [facility] 06/10/2014, 8:11 AM  LOS: 1 day

## 2014-06-10 NOTE — Care Management Utilization Note (Signed)
UR completed 

## 2014-06-10 NOTE — Progress Notes (Signed)
INITIAL NUTRITION ASSESSMENT  DOCUMENTATION CODES Per approved criteria  -Severe malnutrition in the context of acute illness or injury  Pt meets criteria for severe MALNUTRITION in the context of acute illness as evidenced by weight loss of >5% in 1 month and energy intake < 75% for >/=1 month   INTERVENTION: D/c Resource Breeze (pt says she's unable to drink)  When diet is advanced to full liquids: start Ensure Complete (vanilla) po BID, each supplement provides 350 kcal and 13 grams of protein   Recommend check mag and phos  NUTRITION DIAGNOSIS: Malnutiriton related to ongoing nausea and vomiting as evidenced by pt diet hx and wt loss of 6% in 1 month.   Goal: Pt will tolerate diet advancement to meet >90% of est nutrition needs  Monitor:  Progression of po intake, weight changes also labs  Reason for Assessment: Malnutrition Screen   49 y.o. female  Admitting Dx: Nausea with vomiting  ASSESSMENT: Pt is s/p EGD on 05/26/14 with balloon dilation of pylorus. Pt has been experiencing intermittent nausea and vomiting for > 30 days. Assessed by RD initially on 2/15 and again on 2/23 with c/o abdominal pain N/V. She was diagnosed with moderate malnutrition. Since discharge she has continued to have poor tolerance of oral intake. She says her nausea is improved this morning and she asked for assistance with tray set-up. She was sipping on her juice and eating jello. Pt isn't tolerating the  [employer] and is asking for Ensure vanilla. Will add BID when diet is advanced.  If pt continues to be unable to meet nutrition needs via oral intake recommend considered alternate method enteral vs TPN. She is at risk for refeeding syndrome given her prolonged hypocaloric intake.  Last BM  2/25.  Labs: glucose 177 (high)  Nutrition Focused Physical Exam:  Subcutaneous Fat:  Orbital Region: mild depletion Upper Arm Region: mild depletion Thoracic and Lumbar Region: WDL  Muscle:   Temple Region: mild-moderate wasting Clavicle Bone Region: mild-moderate wasting Clavicle and Acromion Bone Region: moderate wasting Scapular Bone Region: mild wasting Dorsal Hand: WDL Patellar Region: mild wasting Anterior Thigh Region: not assessed Posterior Calf Region: not assessed  Edema: none noted    Height: Ht Readings from Last 1 Encounters:  06/09/14 5\' 4"  (1.626 m)    Weight: Wt Readings from Last 1 Encounters:  06/10/14 121 lb 14.6 oz (55.3 kg)    Ideal Body Weight: 120#  % Ideal Body Weight: 102%  Wt Readings from Last 10 Encounters:  06/10/14 121 lb 14.6 oz (55.3 kg)  06/01/14 127 lb 3.3 oz (57.7 kg)  05/23/14 128 lb 8.5 oz (58.3 kg)  05/18/14 130 lb (58.968 kg)  05/11/14 131 lb (59.421 kg)  03/31/14 137 lb (62.143 kg)  04/30/11 125 lb (56.7 kg)    Usual Body Weight: 130-135#  % Usual Body Weight: 94%  BMI:  Body mass index is 20.92 kg/(m^2). normal range  Estimated Nutritional Needs: Kcal: 1650-1760 Protein: 82-93 gr Fluid: 1.7-1.8 liters daily  Skin: no new issues  Diet Order: Diet Heart   EDUCATION NEEDS: -No education needs identified at this time   Intake/Output Summary (Last 24 hours) at 06/10/14 0819 Last data filed at 06/10/14 0600  Gross per 24 hour  Intake 1578.33 ml  Output    750 ml  Net 828.33 ml    Last BM: 06/04/14   Labs:   Recent Labs Lab 06/04/14 0545 06/09/14 1114 06/10/14 0306  NA 140 133* 140  K 3.6  3.2* 4.6  CL 107 98 113*  CO2 22 20 23   BUN 7 20 13   CREATININE 0.46* 0.71 0.49*  CALCIUM 9.5 10.7* 9.1  GLUCOSE 74 116* 177*    CBG (last 3)  No results for input(s): GLUCAP in the last 72 hours.  Scheduled Meds: . enoxaparin (LOVENOX) injection  40 mg Subcutaneous Q24H  . feeding supplement (RESOURCE BREEZE)  1 Container Oral TID BM  . hydrocortisone sod succinate (SOLU-CORTEF) inj  60 mg Intravenous Q6H  . insulin aspart  0-5 Units Subcutaneous QHS  . insulin aspart  0-9 Units Subcutaneous TID  WC  . insulin detemir  5 Units Subcutaneous BID  . pantoprazole  40 mg Oral Daily  . propranolol  40 mg Oral QID  . propylthiouracil  150 mg Oral Q6H    Continuous Infusions: . dextrose 5 % and 0.9 % NaCl with KCl 40 mEq/L 100 mL/hr at 06/10/14 0600    Past Medical History  Diagnosis Date  . Hypertension   . Asthma   . Hyperthyroidism   . Enlarged ovary     right CT 05/2014    Past Surgical History  Procedure Laterality Date  . Abdominal hysterectomy    . Ovarian cyst surgery    . Esophagogastroduodenoscopy N/A 05/26/2014    Procedure: ESOPHAGOGASTRODUODENOSCOPY (EGD);  Surgeon: Danie Binder, MD;  Location: [facility];  Service: Endoscopy;  Laterality: N/A;  . Balloon dilation N/A 05/26/2014    Procedure: BALLOON DILATION OF THE PYLORUS;  Surgeon: Danie Binder, MD;  Location: [facility];  Service: Endoscopy;  Laterality: N/A;    Colman Cater MS,RD,CSG,LDN Office: [phone] Pager: [phone]

## 2014-06-10 NOTE — Progress Notes (Signed)
Subjective: F/U for severe thyrotoxicosis . She is feeling better, stays in ICU on cardiac monitoring. She has tolerated her medications and  Some liquid diet.  Objective: Vital signs in last 24 hours:  Filed Vitals:   06/10/14 0800 06/10/14 0900 06/10/14 0942 06/10/14 1000  BP: 139/78 155/80 146/56 129/64  Pulse: 95 87 106 105  Temp:      TempSrc:      Resp: 26 24  19   Height:      Weight:      SpO2: 100% 100%  100%    Intake/Output Summary (Last 24 hours) at 06/10/14 1028 Last data filed at 06/10/14 0600  Gross per 24 hour  Intake 1578.33 ml  Output    750 ml  Net 828.33 ml     GEN: weak, but no acute distress. HEENT: no exophthalmos Neck: no gross goiter Chest: CLTB CVS : RRR, no murmur. She has tachycardia. Abdomen: mild BLQ tenderness Ext: no edema, global wasting of skeletal muscles. CNS:No gross deficit.  Lab Results:  Basic Metabolic Panel:  Recent Labs  06/09/14 1114 06/10/14 0306  NA 133* 140  K 3.2* 4.6  CL 98 113*  CO2 20 23  GLUCOSE 116* 177*  BUN 20 13  CREATININE 0.71 0.49*  CALCIUM 10.7* 9.1    Liver Function Tests:   Recent Labs  06/09/14 1114  AST 34  ALT 25  ALKPHOS 58  BILITOT 0.8  PROT 7.8  ALBUMIN 3.7   No results for input(s): LIPASE, AMYLASE in the last 72 hours.  CBC:  Recent Labs  06/09/14 1114 06/10/14 0306  WBC 5.6 4.5  NEUTROABS 2.2  --   HGB 12.5 9.1*  HCT 37.7 27.5*  MCV 86.7 86.2  PLT 286 207    Cardiac Enzymes:  Recent Labs  06/09/14 1114 06/09/14 2107 06/10/14 0306  TROPONINI <0.03 <0.03 <0.03      Studies/Results: Dg Chest Port 1 View  06/09/2014   CLINICAL DATA:  Nausea, vomiting, abdominal pain  EXAM: PORTABLE CHEST - 1 VIEW  COMPARISON:  06/01/2014  FINDINGS: Cardiomediastinal silhouette is stable. No acute infiltrate or pleural effusion. No pulmonary edema. Bony thorax is unremarkable.  IMPRESSION: No active disease.   Electronically Signed   By: Lahoma Crocker M.D.   On: 06/09/2014  16:09   Dg Abd Portable 1v  06/09/2014   CLINICAL DATA:  Nausea, vomiting, abdominal pain  EXAM: PORTABLE ABDOMEN - 1 VIEW  COMPARISON:  06/01/2014  FINDINGS: Mild gaseous distended small bowel loops are noted mid abdomen without air-fluid levels. Ileus or enteritis cannot be excluded. No free abdominal air is noted.  IMPRESSION: Mild gaseous distended small bowel loops mid abdomen without air-fluid levels. Ileus or enteritis cannot be excluded. Clinical correlation is necessary. No free abdominal air.   Electronically Signed   By: Lahoma Crocker M.D.   On: 06/09/2014 16:10     Medications:  Scheduled Meds: . enoxaparin (LOVENOX) injection  40 mg Subcutaneous Q24H  . feeding supplement (RESOURCE BREEZE)  1 Container Oral TID BM  . hydrocortisone sod succinate (SOLU-CORTEF) inj  60 mg Intravenous Q6H  . insulin aspart  0-5 Units Subcutaneous QHS  . insulin aspart  0-9 Units Subcutaneous TID WC  . insulin detemir  5 Units Subcutaneous BID  . pantoprazole  40 mg Oral Daily  . propranolol  40 mg Oral QID  . propylthiouracil  150 mg Oral Q6H   Continuous Infusions: . sodium chloride 65 mL/hr at 06/10/14 [id number]  PRN Meds:.acetaminophen **OR** acetaminophen, ondansetron **OR** ondansetron (ZOFRAN) IV  Assessment/Plan: Principal Problem:   Nausea with vomiting Active Problems:   Essential hypertension   Tachycardia   Dehydration   Abdominal pain   Hyperthyroidism   Generalized weakness   Malnutrition of moderate degree   Thyroid storm   Hyperglycemia, drug-induced   Anemia due to other cause   Hypokalemia  Plan: She is responding to therapy. She has confirmed severe hyperthyroidism, her thyroid hormones are high and thyroid uptake was 45 %. She was in preparation for definitive ablative therapy with radioactive iodine this week. However, she deteriorated clinically from intractable nausea/vomiting . Even though her clinical presentation is atypical, she has signs and symptoms suspicious  for thyroid storm. Thyroid storm is rare and a clinical diagnosis of exclusion. She has estimated score of 60 on thyroid storm diagnostic criteria. ( >45 highly suggestive).  she  needed  a quick antithyroid therapy, vs the slower I131 ablation.  I will continue to  follow the thyroid storm protocol. Pt needs to stay  in ICU for cardiac monitoring. She will continue  beta blocker, thionamide with high dose PTU, and high dose steroid therapy. She has a documented allergy to iodinated contrast, hence will avoid potassium iodide for now. She will need dextrose support as she is unable to tolerate adequate  oral feeding, oxygen as needed. Close follow up and monitoring required. D/C insulin , will lower flow rate of D5%. She will probably need further investigation for the severe and intractable nausea/vomiting. I will follow with you.    LOS: 1 day    Bonne Whack, MD 06/10/2014, 10:28 AM

## 2014-06-10 NOTE — Care Management Note (Addendum)
    Page 1 of 1   06/15/2014     11:39:22 AM CARE MANAGEMENT NOTE 06/15/2014  Patient:  KEDRA, MCGLADE   Account Number:  [id number]  Date Initiated:  06/10/2014  Documentation initiated by:  Jolene Provost  Subjective/Objective Assessment:   Pt admitted with sepsis. Pt is from home, lives with husband. Pt is independent at baseline, has no HH services, DME's or med needs. Pt's PCP is Dr. Alroy Dust.     Action/Plan:   Pt plans to discharge home with self care. No CM needs.   Anticipated DC Date:  06/13/2014   Anticipated DC Plan:  [place]  CM consult      Choice offered to / List presented to:             Status of service:  Completed, signed off Medicare Important Message given?   (If response is "NO", the following Medicare IM given date fields will be blank) Date Medicare IM given:   Medicare IM given by:   Date Additional Medicare IM given:   Additional Medicare IM given by:    Discharge Disposition:  HOME/SELF CARE  Per UR Regulation:  Reviewed for med. necessity/level of care/duration of stay  If discussed at [place] of Stay Meetings, dates discussed:    Comments:  06/15/2014 [place], RN, MSN, CM Pt discharging home with self care. No CM needs identified. 06/10/2014 [place], RN, MSN, CM

## 2014-06-11 DIAGNOSIS — R739 Hyperglycemia, unspecified: Secondary | ICD-10-CM

## 2014-06-11 DIAGNOSIS — E876 Hypokalemia: Secondary | ICD-10-CM

## 2014-06-11 LAB — GLUCOSE, CAPILLARY
GLUCOSE-CAPILLARY: 155 mg/dL — AB (ref 70–99)
GLUCOSE-CAPILLARY: 139 mg/dL — AB (ref 70–99)
Glucose-Capillary: 216 mg/dL — ABNORMAL HIGH (ref 70–99)
Glucose-Capillary: 142 mg/dL — ABNORMAL HIGH (ref 70–99)

## 2014-06-11 LAB — CBC
HCT: 27.2 % — ABNORMAL LOW (ref 36.0–46.0)
HEMOGLOBIN: 9 g/dL — AB (ref 12.0–15.0)
MCH: 28.6 pg (ref 26.0–34.0)
MCHC: 33.1 g/dL (ref 30.0–36.0)
MCV: 86.3 fL (ref 78.0–100.0)
Platelets: 198 10*3/uL (ref 150–400)
RBC: 3.15 MIL/uL — AB (ref 3.87–5.11)
RDW: 11.7 % (ref 11.5–15.5)
WBC: 8.1 10*3/uL (ref 4.0–10.5)

## 2014-06-11 LAB — COMPREHENSIVE METABOLIC PANEL
ALBUMIN: 3.1 g/dL — AB (ref 3.5–5.2)
ALK PHOS: 47 U/L (ref 39–117)
ALT: 20 U/L (ref 0–35)
AST: 19 U/L (ref 0–37)
Anion gap: 6 (ref 5–15)
BUN: 10 mg/dL (ref 6–23)
CALCIUM: 9.3 mg/dL (ref 8.4–10.5)
CO2: 24 mmol/L (ref 19–32)
Chloride: 110 mmol/L (ref 96–112)
Creatinine, Ser: 0.41 mg/dL — ABNORMAL LOW (ref 0.50–1.10)
GFR calc Af Amer: >90 mL/min (ref >90)
GFR calc non Af Amer: >90 mL/min (ref >90)
Glucose, Bld: 119 mg/dL — ABNORMAL HIGH (ref 70–99)
POTASSIUM: 3.1 mmol/L — AB (ref 3.5–5.1)
SODIUM: 140 mmol/L (ref 135–145)
Total Bilirubin: 0.3 mg/dL (ref 0.3–1.2)
Total Protein: 6 g/dL (ref 6.0–8.3)

## 2014-06-11 LAB — PREGNANCY, URINE: Preg Test, Ur: NEGATIVE

## 2014-06-11 LAB — OCCULT BLOOD X 1 CARD TO LAB, STOOL: FECAL OCCULT BLD: NEGATIVE

## 2014-06-11 LAB — DRUG SCREEN PANEL (SERUM)

## 2014-06-11 MED ORDER — HYDROCORTISONE NA SUCCINATE PF 100 MG IJ SOLR
25.0000 mg | Freq: Four times a day (QID) | INTRAMUSCULAR | Status: DC
  Administered 2014-06-11 – 2014-06-12 (×4): 25 mg via INTRAVENOUS
  Filled 2014-06-11 (×4): qty 2

## 2014-06-11 MED ORDER — PROPYLTHIOURACIL 50 MG PO TABS
100.0000 mg | ORAL_TABLET | Freq: Four times a day (QID) | ORAL | Status: DC
  Administered 2014-06-11 – 2014-06-12 (×4): 100 mg via ORAL
  Filled 2014-06-11 (×9): qty 2

## 2014-06-11 MED ORDER — POTASSIUM CHLORIDE CRYS ER 20 MEQ PO TBCR
20.0000 meq | EXTENDED_RELEASE_TABLET | Freq: Two times a day (BID) | ORAL | Status: AC
  Administered 2014-06-11 (×2): 20 meq via ORAL
  Filled 2014-06-11 (×2): qty 1

## 2014-06-11 MED ORDER — PROPRANOLOL HCL 20 MG PO TABS
40.0000 mg | ORAL_TABLET | Freq: Three times a day (TID) | ORAL | Status: DC
  Administered 2014-06-11 – 2014-06-12 (×3): 40 mg via ORAL
  Filled 2014-06-11 (×3): qty 2

## 2014-06-11 MED ORDER — HYDRALAZINE HCL 20 MG/ML IJ SOLN: 5.0000 mg | Freq: Four times a day (QID) | INTRAMUSCULAR | Status: DC | PRN

## 2014-06-11 NOTE — Progress Notes (Addendum)
[facility] PROGRESS NOTE  [name] POE:[id number] DOB: 11-15-1965 DOA: 06/09/2014 PCP: No primary care provider on file.    Code Status: Full code Family Communication: Family not available; discussed with patient Disposition Plan: Discharge to home when clinically appropriate   Consultants:  Endocrinology, Dr. Dorris Fetch  Procedures:  None  Antibiotics:  None  HPI/Subjective: Patient denies nausea, vomiting, or abdominal pain. She denies chest pain.  Objective: Filed Vitals:   06/11/14 0900  BP: 164/81  Pulse: 81  Temp:   Resp: 18   temperature 97.9. Oxygen saturation 100% on room air.  Intake/Output Summary (Last 24 hours) at 06/11/14 1028 Last data filed at 06/11/14 0600  Gross per 24 hour  Intake   1313 ml  Output    650 ml  Net    663 ml   Filed Weights   06/09/14 1111 06/10/14 0500 06/11/14 0500  Weight: 49.896 kg (110 lb) 55.3 kg (121 lb 14.6 oz) 54.2 kg (119 lb 7.8 oz)    Exam:   General:  49 year old African-American woman lying in bed, in no acute distress.  Cardiovascular: S1, S2, no murmurs rubs or gallops.  Respiratory: Clear to auscultation bilaterally.  Abdomen: Positive bowel sounds, soft, mildly tender generally, no distention, masses, or rigidity.  Musculoskeletal: No acute hot red joints. No pedal edema.   Data Reviewed: Basic Metabolic Panel:  Recent Labs Lab 06/09/14 1114 06/10/14 0306 06/11/14 0756  NA 133* 140 140  K 3.2* 4.6 3.1*  CL 98 113* 110  CO2 20 23 24   GLUCOSE 116* 177* 119*  BUN 20 13 10   CREATININE 0.71 0.49* 0.41*  CALCIUM 10.7* 9.1 9.3   Liver Function Tests:  Recent Labs Lab 06/09/14 1114 06/11/14 0756  AST 34 19  ALT 25 20  ALKPHOS 58 47  BILITOT 0.8 0.3  PROT 7.8 6.0  ALBUMIN 3.7 3.1*    Recent Labs Lab 06/10/14 1041  LIPASE 97*   No results for input(s): AMMONIA in the last 168 hours. CBC:  Recent Labs Lab 06/09/14 1114 06/10/14 0306 06/11/14 0756  WBC 5.6 4.5 8.1   NEUTROABS 2.2  --   --   HGB 12.5 9.1* 9.0*  HCT 37.7 27.5* 27.2*  MCV 86.7 86.2 86.3  PLT 286 207 198   Cardiac Enzymes:  Recent Labs Lab 06/09/14 1114 06/09/14 2107 06/10/14 0306  TROPONINI <0.03 <0.03 <0.03   BNP (last 3 results)  Recent Labs  06/09/14 1114  BNP 5.0    ProBNP (last 3 results) No results for input(s): PROBNP in the last 8760 hours.  CBG:  Recent Labs Lab 06/10/14 1153 06/10/14 2040  GLUCAP 174* 216*    Recent Results (from the past 240 hour(s))  Culture, Urine     Status: None   Collection Time: 06/02/14 11:20 AM  Result Value Ref Range Status   Specimen Description URINE, CLEAN CATCH  Final   Special Requests NONE  Final   Colony Count   Final    >=100,000 COLONIES/ML Performed at [facility]    Culture   Final    STAPHYLOCOCCUS SPECIES (COAGULASE NEGATIVE) Note: RIFAMPIN AND GENTAMICIN SHOULD NOT BE USED AS SINGLE DRUGS FOR TREATMENT OF STAPH INFECTIONS. Performed at [facility]    Report Status 06/04/2014 FINAL  Final   Organism ID, Bacteria STAPHYLOCOCCUS SPECIES (COAGULASE NEGATIVE)  Final      Susceptibility   Staphylococcus species (coagulase negative) - MIC*    GENTAMICIN >=16 RESISTANT Resistant  LEVOFLOXACIN >=8 RESISTANT Resistant     NITROFURANTOIN <=16 SENSITIVE Sensitive     OXACILLIN >=4 RESISTANT Resistant     PENICILLIN >=0.5 RESISTANT Resistant     RIFAMPIN <=0.5 SENSITIVE Sensitive     TRIMETH/SULFA 160 RESISTANT Resistant     VANCOMYCIN 2 SENSITIVE Sensitive     TETRACYCLINE 2 SENSITIVE Sensitive     * STAPHYLOCOCCUS SPECIES (COAGULASE NEGATIVE)  H.pylori Antigen, Stool     Status: None   Collection Time: 06/03/14  2:00 PM  Result Value Ref Range Status   Specimen Description STOOL  Final   Special Requests NONE  Final   H. pylori ag, stool POSITIVE Performed at [facility]   Final   Report Status 06/06/2014 FINAL  Final  Urine culture     Status: None   Collection Time:  06/09/14 11:10 AM  Result Value Ref Range Status   Specimen Description URINE, CLEAN CATCH  Final   Special Requests NONE  Final   Colony Count   Final    >=100,000 COLONIES/ML Performed at [facility]    Culture   Final    Multiple bacterial morphotypes present, none predominant. Suggest appropriate recollection if clinically indicated. Performed at [facility]    Report Status 06/10/2014 FINAL  Final  MRSA PCR Screening     Status: None   Collection Time: 06/09/14  3:22 PM  Result Value Ref Range Status   MRSA by PCR NEGATIVE NEGATIVE Final    Comment:        The GeneXpert MRSA Assay (FDA approved for NASAL specimens only), is one component of a comprehensive MRSA colonization surveillance program. It is not intended to diagnose MRSA infection nor to guide or monitor treatment for MRSA infections.      Studies: Dg Chest Port 1 View  06/09/2014   CLINICAL DATA:  Nausea, vomiting, abdominal pain  EXAM: PORTABLE CHEST - 1 VIEW  COMPARISON:  06/01/2014  FINDINGS: Cardiomediastinal silhouette is stable. No acute infiltrate or pleural effusion. No pulmonary edema. Bony thorax is unremarkable.  IMPRESSION: No active disease.   Electronically Signed   By: Lahoma Crocker M.D.   On: 06/09/2014 16:09   Dg Abd Portable 1v  06/09/2014   CLINICAL DATA:  Nausea, vomiting, abdominal pain  EXAM: PORTABLE ABDOMEN - 1 VIEW  COMPARISON:  06/01/2014  FINDINGS: Mild gaseous distended small bowel loops are noted mid abdomen without air-fluid levels. Ileus or enteritis cannot be excluded. No free abdominal air is noted.  IMPRESSION: Mild gaseous distended small bowel loops mid abdomen without air-fluid levels. Ileus or enteritis cannot be excluded. Clinical correlation is necessary. No free abdominal air.   Electronically Signed   By: Lahoma Crocker M.D.   On: 06/09/2014 16:10    Scheduled Meds: . enoxaparin (LOVENOX) injection  40 mg Subcutaneous Q24H  . feeding supplement (ENSURE  COMPLETE)  237 mL Oral BID BM  . hydrocortisone sod succinate (SOLU-CORTEF) inj  25 mg Intravenous Q6H  . pantoprazole  40 mg Oral Daily  . propranolol  40 mg Oral TID  . propylthiouracil  100 mg Oral Q6H   Continuous Infusions: . sodium chloride 65 mL/hr at 06/11/14 0600    Principal Problem:   Nausea with vomiting Active Problems:   Hyperthyroidism   Thyroid storm   Essential hypertension   Abdominal pain   Tachycardia   Dehydration   Generalized weakness   Malnutrition of moderate degree   Hyperglycemia, drug-induced   Anemia due to  other cause   Hypokalemia   1. Recurrent nausea, vomiting, and abdominal pain. Etiology not quite clear, but hyperthyroidism could be playing a part. Previous hospitalization noted for acute pancreatitis. Follow-up lipase was marginally elevated, doubt recurrent acute pancreatitis, but we'll continue to monitor. Patient has had extensive recent workup with an EGD on 05/26/14 revealing mild nonerosive gastritis and possible pyloric stenosis, status post dilatation. Stool antigen was H. pylori positive during previous admission. Not sure if treatment will be indicated at this time given her recurrent nausea and vomiting-treatment may exacerbate symptoms, but Protonix was started. Continue Zofran as needed. CT scan of the abdomen and pelvis on 05/23/14 revealed no definite acute abnormalities Ultrasound of the abdomen on 05/23/14 was unremarkable. Recent UTI was treated briefly with Rocephin. Current UA is not sterile and she has no complaints of dysuria, so will not start an antibiotic. Urine pregnancy test She is currently tolerating solid foods.  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 Status post thyroid biopsy on 06/05/14 with results revealing benign thyroid tissue. Dr. Dorris Fetch was consulted and is following. Per his recommendations, the patient is being treated with IV Solu-Cortef, thionamide, and PTU and propanolol. She  will need a total of 5 days of PTU before I-131 therapy. Patient's heart rate and blood pressure are much improved. IV Solu-Cortef, PTU, and propanolol dosing have been decreased some by Dr. Dorris Fetch. Continue current treatment or per Dr. Dorris Fetch.  Hypertension and tachycardia, in part secondary to hyperthyroidism. The patient's blood pressure is not optimal, but will hold off on adding another antihypertensive medication for now. Will add when necessary hydralazine for systolic blood pressure greater than 170. We'll continue to monitor.  Recurrent hypokalemia. Status post potassium chloride repletion and IV fluids; her serum potassium had improved, but is low again. We'll start potassium chloride by mouth if tolerated. We'll check a magnesium level.  Anemia. Etiology unknown. Her anemia panel reveals a total iron of 165, TIBC of 221, ferritin of 692, folate of greater than 20, and vitamin B12 396. Her anemia is likely secondary to chronic disease or hyperthyroidism. Patient noted for history of nonerosive gastritis, but doubt that this is to etiology. Protonix started.  Hyperglycemia. Likely steroid-induced and from dextrose and IV fluids. Dextrose discontinued from IV fluids as the patient is eating better. Her CBGs have improved and Dr. Dorris Fetch discontinued sliding scale insulin. Agree.   Will transfer patient out of the ICU to telemetry.   Time spent: 35 minutes    [facility] Pager [phone]. If 7PM-7AM, please contact night-coverage at [email or web address], password [email or web address] 06/11/2014, 10:28 AM  LOS: 2 days

## 2014-06-11 NOTE — Plan of Care (Signed)
Problem: Phase I Progression Outcomes Goal: Pain controlled with appropriate interventions Outcome: Progressing Denies pain at this time Goal: OOB as tolerated unless otherwise ordered Outcome: Progressing OOB to [facility] with minimal assistance, steady gait Goal: Initial discharge plan identified Outcome: [place] with family Goal: Voiding-avoid urinary catheter unless indicated Outcome: Completed/Met Date Met:  06/11/14 Voided without difficulty, no catheter at this time Goal: Hemodynamically stable Outcome: Progressing Vital signs stable

## 2014-06-11 NOTE — Progress Notes (Signed)
Subjective: F/U for severe thyrotoxicosis . She is feeling better, stays in ICU on cardiac monitoring. She has tolerated her medications and some solid diet. She still has some nausea.  Objective: Vital signs in last 24 hours:  Filed Vitals:   06/11/14 0400 06/11/14 0500 06/11/14 0600 06/11/14 0809  BP: 155/82 148/82 162/88 152/85  Pulse: 98 85 94 81  Temp: 97.9 F (36.6 C)     TempSrc: Oral     Resp:  27 24   Height:      Weight:  54.2 kg (119 lb 7.8 oz)    SpO2: 100% 100% 100%     Intake/Output Summary (Last 24 hours) at 06/11/14 0830 Last data filed at 06/11/14 0600  Gross per 24 hour  Intake   1313 ml  Output    650 ml  Net    663 ml     GEN: weak, but no acute distress. HEENT: no exophthalmos Neck: no gross goiter Chest: CLTB CVS : RRR, no murmur. She has tachycardia. Abdomen: mild BLQ tenderness Ext: no edema, global wasting of skeletal muscles. CNS:No gross deficit.  Lab Results:  Basic Metabolic Panel:  Recent Labs  06/09/14 1114 06/10/14 0306  NA 133* 140  K 3.2* 4.6  CL 98 113*  CO2 20 23  GLUCOSE 116* 177*  BUN 20 13  CREATININE 0.71 0.49*  CALCIUM 10.7* 9.1    Liver Function Tests:   Recent Labs  06/09/14 1114  AST 34  ALT 25  ALKPHOS 58  BILITOT 0.8  PROT 7.8  ALBUMIN 3.7    Recent Labs  06/10/14 1041  LIPASE 97*    CBC:  Recent Labs  06/09/14 1114 06/10/14 0306 06/11/14 0756  WBC 5.6 4.5 8.1  NEUTROABS 2.2  --   --   HGB 12.5 9.1* 9.0*  HCT 37.7 27.5* 27.2*  MCV 86.7 86.2 86.3  PLT 286 207 198    Cardiac Enzymes:  Recent Labs  06/09/14 1114 06/09/14 2107 06/10/14 0306  TROPONINI <0.03 <0.03 <0.03      Studies/Results: Dg Chest Port 1 View  06/09/2014   CLINICAL DATA:  Nausea, vomiting, abdominal pain  EXAM: PORTABLE CHEST - 1 VIEW  COMPARISON:  06/01/2014  FINDINGS: Cardiomediastinal silhouette is stable. No acute infiltrate or pleural effusion. No pulmonary edema. Bony thorax is unremarkable.   IMPRESSION: No active disease.   Electronically Signed   By: Lahoma Crocker M.D.   On: 06/09/2014 16:09   Dg Abd Portable 1v  06/09/2014   CLINICAL DATA:  Nausea, vomiting, abdominal pain  EXAM: PORTABLE ABDOMEN - 1 VIEW  COMPARISON:  06/01/2014  FINDINGS: Mild gaseous distended small bowel loops are noted mid abdomen without air-fluid levels. Ileus or enteritis cannot be excluded. No free abdominal air is noted.  IMPRESSION: Mild gaseous distended small bowel loops mid abdomen without air-fluid levels. Ileus or enteritis cannot be excluded. Clinical correlation is necessary. No free abdominal air.   Electronically Signed   By: Lahoma Crocker M.D.   On: 06/09/2014 16:10     Medications:  Scheduled Meds: . enoxaparin (LOVENOX) injection  40 mg Subcutaneous Q24H  . feeding supplement (ENSURE COMPLETE)  237 mL Oral BID BM  . hydrocortisone sod succinate (SOLU-CORTEF) inj  25 mg Intravenous Q6H  . pantoprazole  40 mg Oral Daily  . propranolol  40 mg Oral TID  . propylthiouracil  100 mg Oral Q6H   Continuous Infusions: . sodium chloride 65 mL/hr at 06/11/14 0600   PRN  Meds:.acetaminophen **OR** acetaminophen, ondansetron **OR** ondansetron (ZOFRAN) IV  Assessment/Plan: Principal Problem:   Nausea with vomiting Active Problems:   Essential hypertension   Tachycardia   Dehydration   Abdominal pain   Hyperthyroidism   Generalized weakness   Malnutrition of moderate degree   Thyroid storm   Hyperglycemia, drug-induced   Anemia due to other cause   Hypokalemia  Plan: She is responding to therapy. She has confirmed severe hyperthyroidism, her thyroid hormones are high and thyroid uptake was 45 %. She was in preparation for definitive ablative therapy with radioactive iodine this week. However, she deteriorated clinically from intractable nausea/vomiting . Even though her clinical presentation is atypical, she has signs and symptoms suspicious for thyroid storm. Thyroid storm is rare and a  clinical diagnosis of exclusion. She has estimated score of 60 on thyroid storm diagnostic criteria. ( >45 highly suggestive).  she  needed  a quick antithyroid therapy, vs the slower I131 ablation.  I will continue to  follow the thyroid storm protocol. She can be transferred to monitored bed out of ICU.  She will continue  beta blocker, thionamide with high dose PTU, and high dose steroid therapy. I have adjusted the doses this AM. She has a documented allergy to iodinated contrast, hence will avoid potassium iodide for now. She will need dextrose support as she is unable to tolerate adequate  oral feeding, oxygen as needed. Close follow up and monitoring required.  She will probably need further investigation for the severe and intractable nausea/vomiting. Once she receives adequate thyroid control, she will be reconsidered for I131 therapy as out patient ( she needs 5 days off PTU before I131 therapy). I will follow with you.    LOS: 2 days    Celica Kotowski, MD 06/11/2014, 8:30 AM

## 2014-06-12 LAB — GLUCOSE, CAPILLARY: Glucose-Capillary: 113 mg/dL — ABNORMAL HIGH (ref 70–99)

## 2014-06-12 MED ORDER — HYDROCORTISONE NA SUCCINATE PF 100 MG IJ SOLR
25.0000 mg | Freq: Three times a day (TID) | INTRAMUSCULAR | Status: DC
  Administered 2014-06-12 – 2014-06-13 (×3): 25 mg via INTRAVENOUS
  Filled 2014-06-12 (×3): qty 2

## 2014-06-12 MED ORDER — PROPRANOLOL HCL 20 MG PO TABS
40.0000 mg | ORAL_TABLET | Freq: Three times a day (TID) | ORAL | Status: DC
  Administered 2014-06-12 (×2): 40 mg via ORAL
  Filled 2014-06-12 (×2): qty 2

## 2014-06-12 MED ORDER — PROPYLTHIOURACIL 50 MG PO TABS
100.0000 mg | ORAL_TABLET | Freq: Three times a day (TID) | ORAL | Status: DC
  Administered 2014-06-12 – 2014-06-15 (×10): 100 mg via ORAL
  Filled 2014-06-12 (×16): qty 2

## 2014-06-12 NOTE — Progress Notes (Signed)
[facility] PROGRESS NOTE  [name] CWC:[id number] DOB: 1965-07-21 DOA: 06/09/2014 PCP: No primary care provider on file.    Code Status: Full code Family Communication:  Discussed with husband Disposition Plan: Discharge to home when clinically appropriate ; likely in 3 days.   Consultants:  Endocrinology, Dr. Dorris Fetch  Procedures:  None  Antibiotics:  None  HPI/Subjective: The patient has some nausea, but no vomiting. She is tolerating her diet , but is afraid that when she returns home, the nausea and vomiting will worsen. No complaint of chest pain.  Objective: Filed Vitals:   06/12/14 1305  BP: 117/61  Pulse: 85  Temp: 98.5 F (36.9 C)  Resp: 20   temperature 97.9. Oxygen saturation 100% on room air.  Intake/Output Summary (Last 24 hours) at 06/12/14 1323 Last data filed at 06/12/14 1304  Gross per 24 hour  Intake 392.17 ml  Output   1525 ml  Net -1132.83 ml   Filed Weights   06/10/14 0500 06/11/14 0500 06/12/14 0630  Weight: 55.3 kg (121 lb 14.6 oz) 54.2 kg (119 lb 7.8 oz) 56.473 kg (124 lb 8 oz)    Exam:   General:  49 year old African-American woman lying in bed, in no acute distress.  Cardiovascular: S1, S2, no murmurs rubs or gallops.  Respiratory: Clear to auscultation bilaterally.  Abdomen: Positive bowel sounds, soft, mildly tender generally, no distention, masses, or rigidity.  Musculoskeletal: No acute hot red joints. No pedal edema.   Data Reviewed: Basic Metabolic Panel:  Recent Labs Lab 06/09/14 1114 06/10/14 0306 06/11/14 0756  NA 133* 140 140  K 3.2* 4.6 3.1*  CL 98 113* 110  CO2 20 23 24   GLUCOSE 116* 177* 119*  BUN 20 13 10   CREATININE 0.71 0.49* 0.41*  CALCIUM 10.7* 9.1 9.3   Liver Function Tests:  Recent Labs Lab 06/09/14 1114 06/11/14 0756  AST 34 19  ALT 25 20  ALKPHOS 58 47  BILITOT 0.8 0.3  PROT 7.8 6.0  ALBUMIN 3.7 3.1*    Recent Labs Lab 06/10/14 1041  LIPASE 97*   No results for  input(s): AMMONIA in the last 168 hours. CBC:  Recent Labs Lab 06/09/14 1114 06/10/14 0306 06/11/14 0756  WBC 5.6 4.5 8.1  NEUTROABS 2.2  --   --   HGB 12.5 9.1* 9.0*  HCT 37.7 27.5* 27.2*  MCV 86.7 86.2 86.3  PLT 286 207 198   Cardiac Enzymes:  Recent Labs Lab 06/09/14 1114 06/09/14 2107 06/10/14 0306  TROPONINI <0.03 <0.03 <0.03   BNP (last 3 results)  Recent Labs  06/09/14 1114  BNP 5.0    ProBNP (last 3 results) No results for input(s): PROBNP in the last 8760 hours.  CBG:  Recent Labs Lab 06/10/14 2040 06/11/14 0844 06/11/14 1650 06/11/14 2203 06/12/14 0735  GLUCAP 216* 142* 155* 139* 113*    Recent Results (from the past 240 hour(s))  H.pylori Antigen, Stool     Status: None   Collection Time: 06/03/14  2:00 PM  Result Value Ref Range Status   Specimen Description STOOL  Final   Special Requests NONE  Final   H. pylori ag, stool POSITIVE Performed at [facility]   Final   Report Status 06/06/2014 FINAL  Final  Urine culture     Status: None   Collection Time: 06/09/14 11:10 AM  Result Value Ref Range Status   Specimen Description URINE, CLEAN CATCH  Final   Special Requests NONE  Final   Colony  Count   Final    >=100,000 COLONIES/ML Performed at [facility]    Culture   Final    Multiple bacterial morphotypes present, none predominant. Suggest appropriate recollection if clinically indicated. Performed at [facility]    Report Status 06/10/2014 FINAL  Final  MRSA PCR Screening     Status: None   Collection Time: 06/09/14  3:22 PM  Result Value Ref Range Status   MRSA by PCR NEGATIVE NEGATIVE Final    Comment:        The GeneXpert MRSA Assay (FDA approved for NASAL specimens only), is one component of a comprehensive MRSA colonization surveillance program. It is not intended to diagnose MRSA infection nor to guide or monitor treatment for MRSA infections.      Studies: No results  found.  Scheduled Meds: . enoxaparin (LOVENOX) injection  40 mg Subcutaneous Q24H  . feeding supplement (ENSURE COMPLETE)  237 mL Oral BID BM  . hydrocortisone sod succinate (SOLU-CORTEF) inj  25 mg Intravenous Q8H  . pantoprazole  40 mg Oral Daily  . propranolol  40 mg Oral TID  . propylthiouracil  100 mg Oral 3 times per day   Continuous Infusions: . sodium chloride 10 mL/hr at 06/11/14 1505    Principal Problem:   Nausea with vomiting Active Problems:   Hyperthyroidism   Thyroid storm   Essential hypertension   Abdominal pain   Tachycardia   Dehydration   Generalized weakness   Malnutrition of moderate degree   Hyperglycemia, drug-induced   Anemia due to other cause   Hypokalemia   1. Recurrent nausea, vomiting, and abdominal pain. Etiology not quite clear, but hyperthyroidism could be playing a part. Previous hospitalization noted for acute pancreatitis. Follow-up lipase was marginally elevated, doubt recurrent acute pancreatitis, but we'll continue to monitor. Patient has had extensive recent workup with an EGD on 05/26/14 revealing mild nonerosive gastritis and possible pyloric stenosis, status post dilatation. Stool antigen was H. pylori positive during previous admission. Not sure if treatment will be indicated at this time given her recurrent nausea and vomiting-treatment may exacerbate symptoms, but Protonix was started. Continue Zofran as needed. CT scan of the abdomen and pelvis on 05/23/14 revealed no definite acute abnormalities Ultrasound of the abdomen on 05/23/14 was unremarkable. Recent UTI was treated briefly with Rocephin. Current UA is not sterile and she has no complaints of dysuria, so will not start an antibiotic. Urine pregnancy test negative. She is currently tolerating solid foods.  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 Status post thyroid biopsy on 06/05/14 with results revealing benign thyroid tissue. Dr. Dorris Fetch was  consulted and is following. Per his recommendations, the patient is being treated with IV Solu-Cortef, thionamide, and PTU and propanolol. She will need a total of 5 days of PTU before I-131 therapy. Tapazole discontinued. Patient's heart rate and blood pressure are much improved. IV Solu-Cortef, PTU, and propanolol dosing have been decreased some by Dr. Dorris Fetch. Continue current treatment or per Dr. Dorris Fetch.  Hypertension and tachycardia, in part secondary to hyperthyroidism. The patient was discharged previously on propanolol 21 g twice a day.  It has been titrated up to 40 mg 3 times a day , per Dr. Dorris Fetch. The patient's blood pressure  had not optimal, so IV hydralazine was added.  her blood pressures trending downward , so there is concern for symptomatic  Hypotension. Will place parameters on propanolol to hold it for systolic blood pressure of less than 110 ; otherwise will  likely need to decrease the current dosing  Recurrent hypokalemia. Status post potassium chloride repletion and IV fluids; her serum potassium had improved,  low again. Potassium chloride by mouth if tolerated. We'll check a magnesium level. Blood work was not taken this morning due to difficult venipuncture.   Anemia. Etiology unknown. Her anemia panel reveals a total iron of 165, TIBC of 221, ferritin of 692, folate of greater than 20, and vitamin B12 396. Her anemia is likely secondary to chronic disease or hyperthyroidism. Patient noted for history of nonerosive gastritis, but doubt that this is the etiology. Protonix started.  Hyperglycemia. Likely steroid-induced and from dextrose and IV fluids. Dextrose discontinued from IV fluids as the patient is eating better. Her CBGs have improved and Dr. Dorris Fetch discontinued sliding scale insulin. Agree.      Time spent: 30 minutes    [facility] Pager [phone]. If 7PM-7AM, please contact night-coverage at [email or web address], password  [facility] 06/12/2014, 1:23 PM  LOS: 3 days

## 2014-06-12 NOTE — Progress Notes (Signed)
Subjective: F/U for severe thyrotoxicosis . She is feeling better. She is out of  ICU .  She has tolerated her medications and solid diet. She still has some nausea.  Objective: Vital signs in last 24 hours:  Filed Vitals:   06/11/14 0809 06/11/14 0900 06/11/14 2217 06/12/14 0630  BP: 152/85 164/81 148/88 156/71  Pulse: 81 81 83 79  Temp:   98.1 F (36.7 C) 98.8 F (37.1 C)  TempSrc:   Oral Oral  Resp:  18 20 20   Height:      Weight:    56.473 kg (124 lb 8 oz)  SpO2:  100% 99% 99%    Intake/Output Summary (Last 24 hours) at 06/12/14 1154 Last data filed at 06/12/14 0932  Gross per 24 hour  Intake 152.17 ml  Output   1325 ml  Net -1172.83 ml     GEN: weak, but no acute distress. HEENT: no exophthalmos Neck: no gross goiter Chest: CLTB CVS : RRR, no murmur. She has tachycardia. Abdomen: mild BLQ tenderness Ext: no edema, global wasting of skeletal muscles. CNS:No gross deficit.  Lab Results:  Basic Metabolic Panel:  Recent Labs  06/10/14 0306 06/11/14 0756  NA 140 140  K 4.6 3.1*  CL 113* 110  CO2 23 24  GLUCOSE 177* 119*  BUN 13 10  CREATININE 0.49* 0.41*  CALCIUM 9.1 9.3    Liver Function Tests:   Recent Labs  06/11/14 0756  AST 19  ALT 20  ALKPHOS 47  BILITOT 0.3  PROT 6.0  ALBUMIN 3.1*    Recent Labs  06/10/14 1041  LIPASE 97*    CBC:  Recent Labs  06/10/14 0306 06/11/14 0756  WBC 4.5 8.1  HGB 9.1* 9.0*  HCT 27.5* 27.2*  MCV 86.2 86.3  PLT 207 198    Cardiac Enzymes:  Recent Labs  06/09/14 2107 06/10/14 0306  TROPONINI <0.03 <0.03      Studies/Results: No results found.   Medications:  Scheduled Meds: . enoxaparin (LOVENOX) injection  40 mg Subcutaneous Q24H  . feeding supplement (ENSURE COMPLETE)  237 mL Oral BID BM  . hydrocortisone sod succinate (SOLU-CORTEF) inj  25 mg Intravenous Q8H  . pantoprazole  40 mg Oral Daily  . propranolol  40 mg Oral TID  . propylthiouracil  100 mg Oral 3 times per day    Continuous Infusions: . sodium chloride 10 mL/hr at 06/11/14 1505   PRN Meds:.acetaminophen **OR** acetaminophen, hydrALAZINE, ondansetron **OR** ondansetron (ZOFRAN) IV  Assessment/Plan: Principal Problem:   Nausea with vomiting Active Problems:   Essential hypertension   Tachycardia   Dehydration   Abdominal pain   Hyperthyroidism   Generalized weakness   Malnutrition of moderate degree   Thyroid storm   Hyperglycemia, drug-induced   Anemia due to other cause   Hypokalemia  Plan: She is responding to therapy.   She will need to stay in hospital through  the weekend.  She will have repeat thyroid function  test tomorrow. If she continues to make progress, she will be discharged on oral steroids , propranolol, and PTU. I have adjusted the doses this AM.    She has confirmed severe hyperthyroidism, her thyroid hormones are high and thyroid uptake was 45 %. She was in preparation for definitive ablative therapy with radioactive iodine this week. However, she deteriorated clinically from intractable nausea/vomiting . Even though her clinical presentation is atypical, she has had signs and symptoms suspicious for thyroid storm. Thyroid storm is rare and a  clinical diagnosis of exclusion. She has estimated score of 60 on thyroid storm diagnostic criteria. ( >45 highly suggestive).  she  needed  a quick antithyroid therapy, vs the slower I131 ablation.   She will continue  beta blocker, thionamide with high dose PTU, and high dose steroid therapy.She has a documented allergy to iodinated contrast, hence will avoid potassium iodide for now.  She is tolerating oral feeding, d/c dextrose support. Close follow up and monitoring required.  She will probably need further investigation, if she continues to have nausea/vomiting.  Once she receives adequate thyroid control, she will be reconsidered for I131 therapy as out patient ( she needs 5 days off PTU before I131 therapy).  I will  follow with you.    LOS: 3 days    Loni Beckwith, MD 06/12/2014, 11:54 AM

## 2014-06-13 DIAGNOSIS — E0511 Thyrotoxicosis with toxic single thyroid nodule with thyrotoxic crisis or storm: Principal | ICD-10-CM

## 2014-06-13 LAB — BASIC METABOLIC PANEL
Anion gap: 6 (ref 5–15)
BUN: 10 mg/dL (ref 6–23)
CALCIUM: 9.2 mg/dL (ref 8.4–10.5)
CO2: 29 mmol/L (ref 19–32)
Chloride: 106 mmol/L (ref 96–112)
Creatinine, Ser: 0.39 mg/dL — ABNORMAL LOW (ref 0.50–1.10)
GFR calc non Af Amer: >90 mL/min (ref >90)
Glucose, Bld: 99 mg/dL (ref 70–99)
POTASSIUM: 3 mmol/L — AB (ref 3.5–5.1)
Sodium: 141 mmol/L (ref 135–145)

## 2014-06-13 LAB — TSH: TSH: <0.01 u[IU]/mL — ABNORMAL LOW (ref 0.350–4.500)

## 2014-06-13 LAB — MAGNESIUM: MAGNESIUM: 1.4 mg/dL — AB (ref 1.5–2.5)

## 2014-06-13 MED ORDER — HYDROCORTISONE 20 MG PO TABS
30.0000 mg | ORAL_TABLET | Freq: Two times a day (BID) | ORAL | Status: DC
  Administered 2014-06-13 – 2014-06-14 (×2): 30 mg via ORAL
  Filled 2014-06-13 (×4): qty 1

## 2014-06-13 MED ORDER — HYDROCORTISONE NA SUCCINATE PF 100 MG IJ SOLR: 25.0000 mg | Freq: Two times a day (BID) | INTRAMUSCULAR | Status: DC

## 2014-06-13 MED ORDER — MAGNESIUM HYDROXIDE 400 MG/5ML PO SUSP
15.0000 mL | Freq: Two times a day (BID) | ORAL | Status: DC
Start: 2014-06-13 — End: 2014-06-15
  Administered 2014-06-13 – 2014-06-15 (×4): 15 mL via ORAL
  Filled 2014-06-13 (×4): qty 30

## 2014-06-13 MED ORDER — HYDROCORTISONE 20 MG PO TABS
ORAL_TABLET | ORAL | Status: AC
  Filled 2014-06-13: qty 2

## 2014-06-13 MED ORDER — MAGNESIUM SULFATE 50 % IJ SOLN: 2.0000 g | Freq: Once | INTRAVENOUS | Status: DC

## 2014-06-13 MED ORDER — POTASSIUM CHLORIDE CRYS ER 20 MEQ PO TBCR
40.0000 meq | EXTENDED_RELEASE_TABLET | Freq: Two times a day (BID) | ORAL | Status: AC
  Administered 2014-06-13 (×2): 40 meq via ORAL
  Filled 2014-06-13 (×2): qty 2

## 2014-06-13 MED ORDER — MAGNESIUM SULFATE 2 GM/50ML IV SOLN
2.0000 g | Freq: Once | INTRAVENOUS | Status: DC
Start: 2014-06-13 — End: 2014-06-15
  Filled 2014-06-13: qty 50

## 2014-06-13 MED ORDER — PROPRANOLOL HCL 20 MG PO TABS
20.0000 mg | ORAL_TABLET | Freq: Three times a day (TID) | ORAL | Status: DC
  Administered 2014-06-13 – 2014-06-15 (×7): 20 mg via ORAL
  Filled 2014-06-13 (×7): qty 1

## 2014-06-13 NOTE — Progress Notes (Signed)
Encouraged patient to ambulate during shift, patient did not wish to do so and wanted to continue resting. Will continue to monitor patient status.

## 2014-06-13 NOTE — Progress Notes (Signed)
[facility] PROGRESS NOTE  [name] QPY:[id number] DOB: [birth year] DOA: 06/09/2014 PCP: No primary care provider on file.    Code Status: Full code Family Communication:  Discussed with husband Disposition Plan: Discharge to home when clinically appropriate ; likely in 2 days.   Consultants:  Endocrinology, Dr. Dorris Fetch  Procedures:  None  Antibiotics:  None  HPI/Subjective: The patient is tolerating her diet with minimal nausea; no vomiting.  Objective: Filed Vitals:   06/13/14 0520  BP: 166/88  Pulse: 60  Temp: 98.4 F (36.9 C)  Resp: 18   temperature 97.9. Oxygen saturation 100% on room air.  Intake/Output Summary (Last 24 hours) at 06/13/14 1349 Last data filed at 06/13/14 0815  Gross per 24 hour  Intake    360 ml  Output   1075 ml  Net   -715 ml   Filed Weights   06/11/14 0500 06/12/14 0630 06/13/14 0520  Weight: 54.2 kg (119 lb 7.8 oz) 56.473 kg (124 lb 8 oz) 54.9 kg (121 lb 0.5 oz)    Exam:   General:  49 year old African-American woman in no acute distress.  Cardiovascular: S1, S2, no murmurs rubs or gallops.  Respiratory: Clear to auscultation bilaterally.  Abdomen: Positive bowel sounds, soft, mildly tender generally, no distention, masses, or rigidity.  Musculoskeletal: No acute hot red joints. No pedal edema.   Data Reviewed: Basic Metabolic Panel:  Recent Labs Lab 06/09/14 1114 06/10/14 0306 06/11/14 0756 06/13/14 0655  NA 133* 140 140 141  K 3.2* 4.6 3.1* 3.0*  CL 98 113* 110 106  CO2 20 23 24 29   GLUCOSE 116* 177* 119* 99  BUN 20 13 10 10   CREATININE 0.71 0.49* 0.41* 0.39*  CALCIUM 10.7* 9.1 9.3 9.2  MG  --   --   --  1.4*   Liver Function Tests:  Recent Labs Lab 06/09/14 1114 06/11/14 0756  AST 34 19  ALT 25 20  ALKPHOS 58 47  BILITOT 0.8 0.3  PROT 7.8 6.0  ALBUMIN 3.7 3.1*    Recent Labs Lab 06/10/14 1041  LIPASE 97*   No results for input(s): AMMONIA in the last 168 hours. CBC:  Recent  Labs Lab 06/09/14 1114 06/10/14 0306 06/11/14 0756  WBC 5.6 4.5 8.1  NEUTROABS 2.2  --   --   HGB 12.5 9.1* 9.0*  HCT 37.7 27.5* 27.2*  MCV 86.7 86.2 86.3  PLT 286 207 198   Cardiac Enzymes:  Recent Labs Lab 06/09/14 1114 06/09/14 2107 06/10/14 0306  TROPONINI <0.03 <0.03 <0.03   BNP (last 3 results)  Recent Labs  06/09/14 1114  BNP 5.0    ProBNP (last 3 results) No results for input(s): PROBNP in the last 8760 hours.  CBG:  Recent Labs Lab 06/10/14 2040 06/11/14 0844 06/11/14 1650 06/11/14 2203 06/12/14 0735  GLUCAP 216* 142* 155* 139* 113*    Recent Results (from the past 240 hour(s))  H.pylori Antigen, Stool     Status: None   Collection Time: 06/03/14  2:00 PM  Result Value Ref Range Status   Specimen Description STOOL  Final   Special Requests NONE  Final   H. pylori ag, stool POSITIVE Performed at [facility]   Final   Report Status 06/06/2014 FINAL  Final  Urine culture     Status: None   Collection Time: 06/09/14 11:10 AM  Result Value Ref Range Status   Specimen Description URINE, CLEAN CATCH  Final   Special Requests NONE  Final  Colony Count   Final    >=100,000 COLONIES/ML Performed at [facility]    Culture   Final    Multiple bacterial morphotypes present, none predominant. Suggest appropriate recollection if clinically indicated. Performed at [facility]    Report Status 06/10/2014 FINAL  Final  MRSA PCR Screening     Status: None   Collection Time: 06/09/14  3:22 PM  Result Value Ref Range Status   MRSA by PCR NEGATIVE NEGATIVE Final    Comment:        The GeneXpert MRSA Assay (FDA approved for NASAL specimens only), is one component of a comprehensive MRSA colonization surveillance program. It is not intended to diagnose MRSA infection nor to guide or monitor treatment for MRSA infections.      Studies: No results found.  Scheduled Meds: . enoxaparin (LOVENOX) injection  40 mg  Subcutaneous Q24H  . feeding supplement (ENSURE COMPLETE)  237 mL Oral BID BM  . hydrocortisone sod succinate (SOLU-CORTEF) inj  25 mg Intravenous Q12H  . magnesium sulfate 1 - 4 g bolus IVPB  2 g Intravenous Once  . pantoprazole  40 mg Oral Daily  . potassium chloride  40 mEq Oral BID  . propranolol  20 mg Oral TID  . propylthiouracil  100 mg Oral 3 times per day   Continuous Infusions: . sodium chloride 10 mL/hr at 06/11/14 1505    Principal Problem:   Nausea with vomiting Active Problems:   Hyperthyroidism   Thyroid storm   Essential hypertension   Abdominal pain   Tachycardia   Dehydration   Generalized weakness   Malnutrition of moderate degree   Hyperglycemia, drug-induced   Anemia due to other cause   Hypokalemia   1. Recurrent nausea, vomiting, and abdominal pain. Etiology not quite clear, but hyperthyroidism could be playing a part. Previous hospitalization noted for acute pancreatitis. Follow-up lipase was marginally elevated, doubt recurrent acute pancreatitis, but we'll continue to monitor. Patient has had extensive recent workup with an EGD on 05/26/14 revealing mild nonerosive gastritis and possible pyloric stenosis, status post dilatation. Stool antigen was H. pylori positive during previous admission. Not sure if treatment will be indicated at this time given her recurrent nausea and vomiting-treatment may exacerbate symptoms, but Protonix was started. Continue Zofran as needed. CT scan of the abdomen and pelvis on 05/23/14 revealed no definite acute abnormalities Ultrasound of the abdomen on 05/23/14 was unremarkable. Recent UTI was treated briefly with Rocephin. Current UA is not sterile and she has no complaints of dysuria, so will not start an antibiotic. Urine pregnancy test negative. She is currently tolerating solid foods.  Hyperthyroidism/cold thyroid nodule, with thyroid storm. Status post thyroid biopsy on 06/05/14 with results revealing benign thyroid  tissue. Dr. Dorris Fetch was consulted and is following. Per his recommendations, the patient is being treated with IV Solu-Cortef, thionamide, and PTU and propanolol. She will need a total of 5 days of PTU before I-131 therapy. PTU will be continued and completed during hospital course. Tapazole discontinued. Patient's heart rate and blood pressure are much improved. IV Solu-Cortef, PTU, and propanolol dosing were decreased some by Dr. Dorris Fetch. Follow-up thyroid function test pending  Hypertension and tachycardia, in part secondary to hyperthyroidism. The patient was discharged previously on propanolol 21 g twice a day.  It has been titrated up to 40 mg 3 times a day , per Dr. Dorris Fetch. The patient's blood pressure  had not optimal, so IV hydralazine was added.  her blood pressures  trending downward , so there is concern for symptomatic  Hypotension. Will place parameters on propanolol to hold it for systolic blood pressure of less than 110 ; otherwise will likely need to decrease the current dosing  Recurrent hypokalemia; mild hypomagnesemia. We'll continue potassium chloride supplementation with increased dosing; give IV magnesium-2 g today.  Anemia. Etiology unknown. Her anemia panel reveals a total iron of 165, TIBC of 221, ferritin of 692, folate of greater than 20, and vitamin B12 396. Her anemia is likely secondary to chronic disease or hyperthyroidism. Patient noted for history of nonerosive gastritis, but doubt that this is the etiology. Protonix started.  Hyperglycemia. Likely steroid-induced and from dextrose and IV fluids. Dextrose discontinued from IV fluids as the patient is eating better. Her CBGs have improved and Dr. Dorris Fetch discontinued sliding scale insulin. Agree.      Time spent: 25 minutes    [facility] Pager [phone]. If 7PM-7AM, please contact night-coverage at [email or web address], password [facility] 06/13/2014, 1:49 PM  LOS: 4 days

## 2014-06-13 NOTE — Progress Notes (Signed)
Patient loss of IV access. Refuses new IV. Patient is due for Magnesium IVPB and IV steroids later today. Dr. Caryn Section notified.

## 2014-06-14 LAB — BASIC METABOLIC PANEL
Anion gap: 6 (ref 5–15)
BUN: 10 mg/dL (ref 6–23)
CHLORIDE: 106 mmol/L (ref 96–112)
CO2: 26 mmol/L (ref 19–32)
Calcium: 9.2 mg/dL (ref 8.4–10.5)
Creatinine, Ser: 0.51 mg/dL (ref 0.50–1.10)
GFR calc non Af Amer: >90 mL/min (ref >90)
Glucose, Bld: 135 mg/dL — ABNORMAL HIGH (ref 70–99)
Potassium: 3.5 mmol/L (ref 3.5–5.1)
Sodium: 138 mmol/L (ref 135–145)

## 2014-06-14 LAB — T3, FREE: T3, Free: 4.5 pg/mL — ABNORMAL HIGH (ref 2.0–4.4)

## 2014-06-14 LAB — T4, FREE: FREE T4: 5.74 ng/dL — AB (ref 0.80–1.80)

## 2014-06-14 MED ORDER — HYDROCORTISONE 20 MG PO TABS
20.0000 mg | ORAL_TABLET | Freq: Two times a day (BID) | ORAL | Status: DC
  Administered 2014-06-14 – 2014-06-15 (×2): 20 mg via ORAL
  Filled 2014-06-14 (×6): qty 1

## 2014-06-14 MED ORDER — POTASSIUM CHLORIDE CRYS ER 20 MEQ PO TBCR: 30.0000 meq | EXTENDED_RELEASE_TABLET | Freq: Two times a day (BID) | ORAL | Status: DC

## 2014-06-14 MED ORDER — POTASSIUM CHLORIDE CRYS ER 20 MEQ PO TBCR
30.0000 meq | EXTENDED_RELEASE_TABLET | Freq: Two times a day (BID) | ORAL | Status: DC
  Administered 2014-06-14 – 2014-06-15 (×3): 30 meq via ORAL
  Filled 2014-06-14 (×6): qty 1

## 2014-06-14 NOTE — Progress Notes (Signed)
[facility] PROGRESS NOTE  [name] WIO:[id number] DOB: December 02, 1965 DOA: 06/09/2014 PCP: No primary care provider on file.    Code Status: Full code Family Communication:  Discussed with husband on 06/12/13. Disposition Plan: Discharge to home when clinically appropriate ; likely in the next day or 2.   Consultants:  Endocrinology, Dr. Dorris Fetch  Procedures:  None  Antibiotics:  None  HPI/Subjective: The patient is tolerating her diet no nausea or vomiting. IV access loss yesterday and the patient refused to have another IV inserted.  Objective: Filed Vitals:   06/14/14 0555  BP: 144/91  Pulse: 72  Temp: 98.4 F (36.9 C)  Resp: 18   temperature 98.4. Oxygen saturation 100% on room air.  Intake/Output Summary (Last 24 hours) at 06/14/14 1401 Last data filed at 06/14/14 1233  Gross per 24 hour  Intake    680 ml  Output      0 ml  Net    680 ml   Filed Weights   06/11/14 0500 06/12/14 0630 06/13/14 0520  Weight: 54.2 kg (119 lb 7.8 oz) 56.473 kg (124 lb 8 oz) 54.9 kg (121 lb 0.5 oz)    Exam:   General:  49 year old African-American woman in no acute distress  Cardiovascular: S1, S2, no murmurs rubs or gallops.  Respiratory: Clear to auscultation bilaterally.  Abdomen: Positive bowel sounds, soft, mildly tender generally, no distention, masses, or rigidity.  Musculoskeletal: No acute hot red joints. No pedal edema.   Data Reviewed: Basic Metabolic Panel:  Recent Labs Lab 06/09/14 1114 06/10/14 0306 06/11/14 0756 06/13/14 0655 06/14/14 0643  NA 133* 140 140 141 138  K 3.2* 4.6 3.1* 3.0* 3.5  CL 98 113* 110 106 106  CO2 20 23 24 29 26   GLUCOSE 116* 177* 119* 99 135*  BUN 20 13 10 10 10   CREATININE 0.71 0.49* 0.41* 0.39* 0.51  CALCIUM 10.7* 9.1 9.3 9.2 9.2  MG  --   --   --  1.4*  --    Liver Function Tests:  Recent Labs Lab 06/09/14 1114 06/11/14 0756  AST 34 19  ALT 25 20  ALKPHOS 58 47  BILITOT 0.8 0.3  PROT 7.8 6.0  ALBUMIN 3.7  3.1*    Recent Labs Lab 06/10/14 1041  LIPASE 97*   No results for input(s): AMMONIA in the last 168 hours. CBC:  Recent Labs Lab 06/09/14 1114 06/10/14 0306 06/11/14 0756  WBC 5.6 4.5 8.1  NEUTROABS 2.2  --   --   HGB 12.5 9.1* 9.0*  HCT 37.7 27.5* 27.2*  MCV 86.7 86.2 86.3  PLT 286 207 198   Cardiac Enzymes:  Recent Labs Lab 06/09/14 1114 06/09/14 2107 06/10/14 0306  TROPONINI <0.03 <0.03 <0.03   BNP (last 3 results)  Recent Labs  06/09/14 1114  BNP 5.0    ProBNP (last 3 results) No results for input(s): PROBNP in the last 8760 hours.  CBG:  Recent Labs Lab 06/10/14 2040 06/11/14 0844 06/11/14 1650 06/11/14 2203 06/12/14 0735  GLUCAP 216* 142* 155* 139* 113*    Recent Results (from the past 240 hour(s))  Urine culture     Status: None   Collection Time: 06/09/14 11:10 AM  Result Value Ref Range Status   Specimen Description URINE, CLEAN CATCH  Final   Special Requests NONE  Final   Colony Count   Final    >=100,000 COLONIES/ML Performed at [facility]    Culture   Final    Multiple  bacterial morphotypes present, none predominant. Suggest appropriate recollection if clinically indicated. Performed at [facility]    Report Status 06/10/2014 FINAL  Final  MRSA PCR Screening     Status: None   Collection Time: 06/09/14  3:22 PM  Result Value Ref Range Status   MRSA by PCR NEGATIVE NEGATIVE Final    Comment:        The GeneXpert MRSA Assay (FDA approved for NASAL specimens only), is one component of a comprehensive MRSA colonization surveillance program. It is not intended to diagnose MRSA infection nor to guide or monitor treatment for MRSA infections.      Studies: No results found.  Scheduled Meds: . enoxaparin (LOVENOX) injection  40 mg Subcutaneous Q24H  . feeding supplement (ENSURE COMPLETE)  237 mL Oral BID BM  . hydrocortisone  30 mg Oral BID  . magnesium hydroxide  15 mL Oral BID  . magnesium  sulfate 1 - 4 g bolus IVPB  2 g Intravenous Once  . pantoprazole  40 mg Oral Daily  . potassium chloride  30 mEq Oral BID  . propranolol  20 mg Oral TID  . propylthiouracil  100 mg Oral 3 times per day   Continuous Infusions:    Principal Problem:   Nausea with vomiting Active Problems:   Hyperthyroidism   Thyroid storm   Essential hypertension   Abdominal pain   Tachycardia   Dehydration   Generalized weakness   Malnutrition of moderate degree   Hyperglycemia, drug-induced   Anemia due to other cause   Hypokalemia   1. Recurrent nausea, vomiting, and abdominal pain. Etiology not quite clear, but hyperthyroidism was likely the primary etiology. Previous hospitalization noted for acute pancreatitis. Follow-up lipase was marginally elevated, doubt recurrent acute pancreatitis, but we'll continue to monitor. Patient has had extensive recent workup with an EGD on 05/26/14 revealing mild nonerosive gastritis and possible pyloric stenosis, status post dilatation. Stool antigen was H. pylori positive during previous admission. Not sure if treatment will be indicated at this time given her recurrent nausea and vomiting-treatment may exacerbate symptoms, but Protonix was started. Continue Zofran as needed. CT scan of the abdomen and pelvis on 05/23/14 revealed no definite acute abnormalities Ultrasound of the abdomen on 05/23/14 was unremarkable. Recent UTI was treated briefly with Rocephin. Current UA is not sterile and she has no complaints of dysuria, so will not start an antibiotic. Urine pregnancy test negative. She is currently tolerating solid foods.  Hyperthyroidism/cold thyroid nodule, with thyroid storm. Status post thyroid biopsy on 06/05/14 with results revealing benign thyroid tissue. Dr. Dorris Fetch was consulted and is following. Per his recommendations, the patient is being treated with IV Solu-Cortef, thionamide, and PTU and propanolol. She will need a total of 5 days of PTU before  I-131 therapy. PTU will be continued and completed during hospital course-day #4-1/2. Tapazole discontinued. Patient's heart rate and blood pressure are much improved. IV Solu-Cortef, PTU, and propanolol dosing were decreased some by Dr. Dorris Fetch. -Patient lost IV access on 06/13/14, Solu-Cortef was changed to by mouth. Follow-up thyroid function test show that her free T4 has decreased from 8.64 to 5.74; and free T3 has decreased from 9.6-4.5.  Hypertension and tachycardia, in part secondary to hyperthyroidism. The patient was discharged previously on propanolol 21 g twice a day.  It has been titrated up to 40 mg 3 times a day , per Dr. Dorris Fetch. The patient's blood pressure  had not optimal, so IV hydralazine was added.  Her blood  pressures trending downward.  Recurrent hypokalemia; mild hypomagnesemia. We'll continue potassium chloride supplementation with increased dosing. Given milk of magnesia for magnesium supplementation.  Anemia. Etiology unknown. Her anemia panel reveals a total iron of 165, TIBC of 221, ferritin of 692, folate of greater than 20, and vitamin B12 396. Her anemia is likely secondary to chronic disease or hyperthyroidism. Patient noted for history of nonerosive gastritis, but doubt that this is the etiology. Protonix started.  Hyperglycemia. Likely steroid-induced and from dextrose and IV fluids. Dextrose discontinued from IV fluids as the patient is eating better. Her CBGs have improved and Dr. Dorris Fetch discontinued sliding scale insulin. Agree.      Time spent: 25 minutes    [facility] Pager [phone]. If 7PM-7AM, please contact night-coverage at [email or web address], password [facility] 06/14/2014, 2:01 PM  LOS: 5 days

## 2014-06-15 ENCOUNTER — Encounter (HOSPITAL_COMMUNITY): Payer: Self-pay | Admitting: Internal Medicine

## 2014-06-15 LAB — CBC
HCT: 29.7 % — ABNORMAL LOW (ref 36.0–46.0)
Hemoglobin: 9.7 g/dL — ABNORMAL LOW (ref 12.0–15.0)
MCH: 28.2 pg (ref 26.0–34.0)
MCHC: 32.7 g/dL (ref 30.0–36.0)
MCV: 86.3 fL (ref 78.0–100.0)
PLATELETS: 223 10*3/uL (ref 150–400)
RBC: 3.44 MIL/uL — ABNORMAL LOW (ref 3.87–5.11)
RDW: 11.9 % (ref 11.5–15.5)
WBC: 9.1 10*3/uL (ref 4.0–10.5)

## 2014-06-15 LAB — BASIC METABOLIC PANEL
ANION GAP: 7 (ref 5–15)
BUN: 11 mg/dL (ref 6–23)
CALCIUM: 9.4 mg/dL (ref 8.4–10.5)
CO2: 27 mmol/L (ref 19–32)
Chloride: 106 mmol/L (ref 96–112)
Creatinine, Ser: 0.44 mg/dL — ABNORMAL LOW (ref 0.50–1.10)
GFR calc Af Amer: >90 mL/min (ref >90)
GLUCOSE: 120 mg/dL — AB (ref 70–99)
Potassium: 4.3 mmol/L (ref 3.5–5.1)
Sodium: 140 mmol/L (ref 135–145)

## 2014-06-15 MED ORDER — POTASSIUM CHLORIDE CRYS ER 15 MEQ PO TBCR: 30.0000 meq | EXTENDED_RELEASE_TABLET | Freq: Every day | ORAL | Status: DC

## 2014-06-15 MED ORDER — HYDROCORTISONE 20 MG PO TABS: 20.0000 mg | ORAL_TABLET | Freq: Two times a day (BID) | ORAL | Status: DC

## 2014-06-15 MED ORDER — PANTOPRAZOLE SODIUM 40 MG PO TBEC: 40.0000 mg | DELAYED_RELEASE_TABLET | Freq: Two times a day (BID) | ORAL | Status: DC

## 2014-06-15 MED ORDER — PROPRANOLOL HCL 20 MG PO TABS: 20.0000 mg | ORAL_TABLET | Freq: Two times a day (BID) | ORAL | Status: DC

## 2014-06-15 MED ORDER — HYDROCHLOROTHIAZIDE 25 MG PO TABS: 12.5000 mg | ORAL_TABLET | Freq: Every day | ORAL | Status: DC

## 2014-06-15 MED ORDER — PROPYLTHIOURACIL 50 MG PO TABS: 100.0000 mg | ORAL_TABLET | Freq: Three times a day (TID) | ORAL | Status: DC

## 2014-06-15 NOTE — Progress Notes (Signed)
Subjective: F/U for severe thyrotoxicosis . She is feeling better. She is now ambulating well, tolerated her medications and solid diet. She denies nausea/vomiting.  Objective: Vital signs in last 24 hours:  Filed Vitals:   06/14/14 0555 06/14/14 1447 06/14/14 2233 06/15/14 0603  BP: 144/91 137/76 119/69 162/80  Pulse: 72 87 83 67  Temp: 98.4 F (36.9 C) 99 F (37.2 C) 98.3 F (36.8 C) 98.6 F (37 C)  TempSrc: Oral Oral Oral Oral  Resp: 18 18 20 17   Height:      Weight:      SpO2: 100% 100% 100% 100%    Intake/Output Summary (Last 24 hours) at 06/15/14 0904 Last data filed at 06/15/14 0605  Gross per 24 hour  Intake    680 ml  Output   1200 ml  Net   -520 ml     GEN: weak, but no acute distress. HEENT: no exophthalmos Neck: no gross goiter Chest: CLTB CVS : RRR, no murmur.  Abdomen: mild BLQ tenderness Ext: no edema, global wasting of skeletal muscles. CNS:No gross deficit.  Lab Results:  Basic Metabolic Panel:  Recent Labs  06/13/14 0655 06/14/14 0643 06/15/14 0551  NA 141 138 140  K 3.0* 3.5 4.3  CL 106 106 106  CO2 29 26 27   GLUCOSE 99 135* 120*  BUN 10 10 11   CREATININE 0.39* 0.51 0.44*  CALCIUM 9.2 9.2 9.4  MG 1.4*  --   --     Liver Function Tests:  No results for input(s): AST, ALT, ALKPHOS, BILITOT, PROT, ALBUMIN in the last 72 hours. No results for input(s): LIPASE, AMYLASE in the last 72 hours.  CBC:  Recent Labs  06/15/14 0551  WBC 9.1  HGB 9.7*  HCT 29.7*  MCV 86.3  PLT 223      Medications:  Scheduled Meds: . enoxaparin (LOVENOX) injection  40 mg Subcutaneous Q24H  . feeding supplement (ENSURE COMPLETE)  237 mL Oral BID BM  . hydrocortisone  20 mg Oral BID  . magnesium hydroxide  15 mL Oral BID  . magnesium sulfate 1 - 4 g bolus IVPB  2 g Intravenous Once  . pantoprazole  40 mg Oral Daily  . potassium chloride  30 mEq Oral BID  . propranolol  20 mg Oral TID  . propylthiouracil  100 mg Oral 3 times per day     Results for [name] (MRN [id number]) as of 06/15/2014 09:04  Ref. Range 06/13/2014 06:55  TSH Latest Range: 0.350-4.500 uIU/mL <0.010 (L)  Free T4 Latest Range: 0.80-1.80 ng/dL 5.74 (H)  T3, Free Latest Range: 2.0-4.4 pg/mL 4.5 (H)    Assessment/Plan: Principal Problem:   Nausea with vomiting Active Problems:   Essential hypertension   Tachycardia   Dehydration   Abdominal pain   Hyperthyroidism   Generalized weakness   Malnutrition of moderate degree   Thyroid storm   Hyperglycemia, drug-induced   Anemia due to other cause   Hypokalemia  Plan: She is responding to therapy.  Her thyroid hormone burden is lowering significantly coupled with positive clinical progress. Free T4 still high at 5.74 ( improving from 8.4).   She can be discharged home on oral medications as follows:  Propranolol 20 mg po BID, PTU 100 mg po TID, hydrocortisone 20 mg po BID. She will need clinic follow up with me on Friday June 19, 2014. She will have repeat labs on Saturday June 20, 2014.  If she continues to improve , she will be  reconsidered for  definitive ablative therapy with radioactive iodine the following  week . Thank you for involving me in her care.   LOS: 6 days    Joanna Beckwith, MD 06/15/2014, 9:04 AM

## 2014-06-15 NOTE — Progress Notes (Signed)
   06/15/14 1030  Mobility  Activity Ambulate in hall  Level of Assistance Independent  Assistive Device None  Distance Ambulated (ft) 1000 ft  Ambulation Response Tolerated well

## 2014-06-15 NOTE — Discharge Summary (Signed)
Physician Discharge Summary  [name] CXK:[id number] DOB: 1966/02/10 DOA: 06/09/2014  PCP: No primary care provider on file.  Endocrinologist, DR. NIDA  Admit date: 06/09/2014 Discharge date: 06/15/2014  Time spent: GREATER THAN 30 minutes  Recommendations for Outpatient Follow-up:  1. The patient will need a follow-up serum potassium rechecked in one week and periodically thereafter. 2. Patient will follow-up with Dr. Dorris Fetch for consideration of ablative therapy with radioactive iodine.  3.    Discharge Diagnoses:  1. Recurrent nausea, vomiting, and abdominal pain. Etiology thought to be secondary to thyrotoxicosis. Resolved. 2. Hyperthyroidism with thyroid storm. 3. History of cold thyroid nodule, status post biopsy 06/05/14-pathology report revealed benign thyroid tissue. 4. Essential Hypertension. 5. Tachycardia, secondary to thyrotoxicosis. 6. Hypokalemia. 7. Normocytic anemia. 8. Steroid-induced hyperglycemia. 9. Malnutrition of moderate degree.   Discharge Condition: Improved.  Diet recommendation: Heart healthy.  Filed Weights   06/11/14 0500 06/12/14 0630 06/13/14 0520  Weight: 54.2 kg (119 lb 7.8 oz) 56.473 kg (124 lb 8 oz) 54.9 kg (121 lb 0.5 oz)    History of present illness:   [name] is a 49 y.o. female with a recent diagnosis  of hyperthyroidism and is followed by endocrinologist, Dr. Dorris Fetch, has had recurrent admissions for nausea, vomiting and diarrhea. Patient was recently discharged from the hospital approximately 5 days ago after being treated for a urinary tract infection, nausea and vomiting. She had a thyroid biopsy done at that time for hyperthyroidism and plans were to start treatment for hyperthyroidism. The patient reported that she had been doing fairly well on the first and second day after returning home. On the third day, she began to develop significant nausea. The following day the patient began to develop vomiting and had been unable to keep any  food or liquid down for the previous  2 days. She followed up with her endocrinologist prior to her presentation to the ED and was noted to be hypotensive and tachycardic. She was sent to the ED for evaluation. Dr. Dorris Fetch saw the patient in the ED and felt that she may be in thyroid storm. He requested admission to the ICU under the hospitalist service.   Hospital Course:    1. Recurrent nausea, vomiting, and abdominal pain. The patient was started on antiemetics IV as needed, IV fluids, and restarted on her PPI. The etiology was highly likely from hyperthyroidism/thyrotoxicosis. Previous hospitalization was noted for acute pancreatitis. Follow-up lipase during this hospitalization was marginally elevated, but doubted recurrent acute pancreatitis. Patient has had extensive recent workup with an EGD on 05/26/14 revealing mild nonerosive gastritis and possible pyloric stenosis, status post dilatation. Stool antigen was H. pylori positive during the previous admission. Treatment was not started or indicated during this hospitalization given her recurrent nausea and vomiting-treatment may exacerbate her symptoms symptoms, but Protonix was restarted. CT scan of the abdomen and pelvis on 05/23/14 revealed no definite acute abnormalities Ultrasound of the abdomen on 05/23/14 was unremarkable. Recent UTI was treated briefly with Rocephin. Current UA was not sterile and she had no complaints of dysuria, so an antibiotic was not started.Urine pregnancy test negative. With treatment of her hyperthyroidism/thyroid storm, her nausea, vomiting, and abdominal pain completely resolved. Her diet was advanced to solid foods which she tolerated very well. She was discharged on twice a day dosing of Protonix. Will defer treatment of positive H. pylori antigen noted during the previous hospitalization in February.   Hyperthyroidism/cold thyroid nodule, with thyroid storm. Patient was recently diagnosed with hyperthyroidism.  She underwent thyroid biopsy on 06/05/14 with results revealing benign thyroid tissue. Dr. Dorris Fetch was consulted and followed the patient during the hospitalization. He felt that the patient was experiencing a thyroid storm. Per his recommendations, the patient was started on treatment with IV Solu-Cortef, thionamide, and PTU and propanolol. She received a total of 5 days of PTU therapy during the hospitalization. Tapazole discontinued. Patient's heart rate and blood pressure improved following therapy. Clinically and symptomatically, she felt much better. The dosing of the steroid, propanolol, and PTU were titrated down following symptomatic and clinical improvement. Follow-up thyroid function test show that her free T4 had decreased from 8.64 to 5.74; and free T3 had decreased from 9.6 to 4.5. She was discharged on propanolol, PTU, and oral hydrocortisone. She will follow-up with Dr. Dorris Fetch on 06/19/14. During his reevaluation, she will be reconsidered for definitive ablative therapy with radioactive iodine.  Hypertension and tachycardia, in part secondary to hyperthyroidism. The patient was discharged previously on propanolol 2o g twice a day. It was titrated up to 40 mg 3 times a day , per Dr. Dorris Fetch. HCTZ was held and she was gently hydrated. Her blood pressure improved, but was not particularly optimally controlled. She was discharged on her usual dose of propanolol and she was instructed to restart on to chlorothiazide.  Recurrent hypokalemia; mild hypomagnesemia. She was treated with oral potassium chloride and oral magnesium. She was discharged on potassium chloride daily.  Normocytic anemia. Her anemia panel revealed a total iron of 165, TIBC of 221, ferritin of 692, folate of greater than 20, and vitamin B12 396. Her anemia was thought to be secondary to chronic disease or hyperthyroidism. Patient noted for history of nonerosive gastritis, but doubt that this was the etiology. She was  restarted on Protonix and multivitamin with iron.  Hyperglycemia. Likely steroid-induced and from dextrose and IV fluids. Her hemoglobin A1c was 5.6 in February 2016. She was treated briefly with sliding scale NovoLog during the hospitalization.    Procedures:  None  Consultations:  Endocrinology, Dr. Dorris Fetch  Discharge Exam: Filed Vitals:   06/15/14 0603  BP: 162/80  Pulse: 67  Temp: 98.6 F (37 C)  Resp: 17     General: 49 year old African-American woman in no acute distress  Cardiovascular: S1, S2, no murmurs rubs or gallops.  Respiratory: Clear to auscultation bilaterally.  Abdomen: Positive bowel sounds, soft, mildly tender generally, no distention, masses, or rigidity.  Musculoskeletal: No acute hot red joints. No pedal edema.   Discharge Instructions   Discharge Instructions    Diet - low sodium heart healthy    Complete by:  As directed      Increase activity slowly    Complete by:  As directed           Current Discharge Medication List    START taking these medications   Details  hydrocortisone (CORTEF) 20 MG tablet Take 1 tablet (20 mg total) by mouth 2 (two) times daily. Qty: 60 tablet, Refills: 0    potassium chloride (KLOR-CON M15) 15 MEQ tablet Take 2 tablets (30 mEq total) by mouth daily. Qty: 60 tablet, Refills: 1    propylthiouracil (PTU) 50 MG tablet Take 2 tablets (100 mg total) by mouth every 8 (eight) hours. Qty: 180 tablet, Refills: 1      CONTINUE these medications which have CHANGED   Details  hydrochlorothiazide (HYDRODIURIL) 25 MG tablet Take 0.5 tablets (12.5 mg total) by mouth daily. Refills: 0    propranolol (INDERAL) 20 MG  tablet Take 1 tablet (20 mg total) by mouth 2 (two) times daily. Qty: 60 tablet, Refills: 3      CONTINUE these medications which have NOT CHANGED   Details  Multiple Vitamin (MULITIVITAMIN WITH MINERALS) TABS Take 1 tablet by mouth daily.    ondansetron (ZOFRAN ODT) 4 MG disintegrating tablet  Take 1 tablet (4 mg total) by mouth every 8 (eight) hours as needed for nausea or vomiting. Qty: 15 tablet, Refills: 0    pantoprazole (PROTONIX) 40 MG tablet Take 1 tablet (40 mg total) by mouth 2 (two) times daily before a meal. Qty: 60 tablet, Refills: 0    promethazine (PHENERGAN) 25 MG suppository Place 25 mg rectally every 8 (eight) hours as needed for nausea or vomiting.     promethazine (PHENERGAN) 25 MG tablet Take 25 mg by mouth every 6 (six) hours as needed for nausea or vomiting.      STOP taking these medications     methimazole (TAPAZOLE) 10 MG tablet      nitrofurantoin, macrocrystal-monohydrate, (MACROBID) 100 MG capsule      oxyCODONE (OXY IR/ROXICODONE) 5 MG immediate release tablet        Allergies  Allergen Reactions  . Contrast Media [Iodinated Diagnostic Agents] Rash  . Iohexol Nausea And Vomiting    Pt. Vomited immediately after injection of Omni 350.    . Vicodin [Hydrocodone-Acetaminophen] Rash   Follow-up Information    Follow up with NIDA,GEBRESELASSIE, MD On 06/19/2014.   Specialty:  Endocrinology   Contact information:   [place] [address] [phone]        The results of significant diagnostics from this hospitalization (including imaging, microbiology, ancillary and laboratory) are listed below for reference.    Significant Diagnostic Studies: Ct Abdomen Pelvis Wo Contrast  05/23/2014   CLINICAL DATA:  49 year old female abdominal pain and vomiting. Endoscopy yesterday. Initial encounter.  EXAM: CT ABDOMEN AND PELVIS WITHOUT CONTRAST  TECHNIQUE: Multidetector CT imaging of the abdomen and pelvis was performed following the standard protocol without IV contrast.  COMPARISON:  CT Abdomen and Pelvis 05/11/2014 and earlier  FINDINGS: minor lung base atelectasis. No pericardial or pleural effusion. No acute osseous abnormality identified.  Diminutive bladder. No pelvic free fluid. Uterus surgically absent. Stable right ovary.   No large bowel inflammation identified. Oral contrast has reached the terminal ileum. Appendix not identified today. No dilated small bowel. Negative stomach. No definite duodenum inflammation.  Noncontrast liver, gallbladder, spleen pancreas, adrenal glands, and kidneys appear stable and within normal limits. No abdominal free fluid or free air.  IMPRESSION: No definite acute or inflammatory process identified in the abdomen or pelvis in the absence of IV contrast.   Electronically Signed   By: Genevie Ann M.D.   On: 05/23/2014 17:13   Ct Head Wo Contrast  06/01/2014   CLINICAL DATA:  49 year old female with altered mental status weakness nausea vomiting diarrhea and headache. Initial encounter.  EXAM: CT HEAD WITHOUT CONTRAST  TECHNIQUE: Contiguous axial images were obtained from the base of the skull through the vertex without intravenous contrast.  COMPARISON:  Normal noncontrast CT appearance of the brain. MRI 07/18/2011 and earlier.  FINDINGS: Visualized paranasal sinuses and mastoids are clear. No acute osseous abnormality identified. Visualized orbits and scalp soft tissues are within normal limits.  Cerebral volume is stable and within normal limits for age. No midline shift, ventriculomegaly, mass effect, evidence of mass lesion, intracranial hemorrhage or evidence of cortically based acute infarction. Gray-white  matter differentiation is within normal limits throughout the brain. No suspicious intracranial vascular hyperdensity.  IMPRESSION: Stable and normal noncontrast CT appearance of the brain.   Electronically Signed   By: Genevie Ann M.D.   On: 06/01/2014 16:16   Nm Thyroid Sng Uptake W/imaging  06/03/2014   CLINICAL DATA:  Hyperthyroidism without crisis  EXAM: THYROID SCAN AND UPTAKE - 24 HOURS  TECHNIQUE: Following the per oral administration of I-131 sodium iodide, the patient returned at 24 hours and uptake measurements were acquired with the uptake probe centered on the neck. Thyroid imaging was  performed following the intravenous administration of the Tc-16m Pertechnetate.  RADIOPHARMACEUTICALS:  9 microCuries I-131 Sodium Iodide and 10 mCi TC-6m Pertechnetate  COMPARISON:  None  FINDINGS: 24 hour radio iodine uptake is calculated at 45%, above the normal range consistent with hyperthyroidism.  Imaging the thyroid gland in 3 projections demonstrates homogeneous tracer distribution throughout both lobes with a cold nodule at the inferior pole of the RIGHT lobe.  IMPRESSION: Elevated 24 hour radio iodine uptake of 45% consistent with hyperthyroidism.  Cold nodule at inferior pole RIGHT lobe; ultrasound assessment recommended prior to consideration of radioactive iodine ablation.   Electronically Signed   By: Lavonia Dana M.D.   On: 06/03/2014 13:13   US Soft Tissue Head/neck  06/03/2014   CLINICAL DATA:  Cold nodules seen on nuclear medicine study.  EXAM: THYROID ULTRASOUND  TECHNIQUE: Ultrasound examination of the thyroid gland and adjacent soft tissues was performed.  COMPARISON:  Nuclear medicine study of same day.  FINDINGS: Right thyroid lobe  Measurements: 4.8 x 1.8 x 1.6 cm. Solid nodule measuring 1.5 x 1.1 x 1.0 cm is noted in lower pole. Adjacent solid nodule measuring 1.4 x 1.1 x 1.0 cm is noted. These correspond in location to defect seen on nuclear medicine study.  Left thyroid lobe  Measurements: 5.0 x 2.1 x 1.3 cm.  No nodules visualized.  Isthmus  Thickness: 5 mm.  No nodules visualized.  Lymphadenopathy  None visualized.  IMPRESSION: Two solid nodules are noted in the inferior pole of the right thyroid lobe which correspond to cold defect seen on nuclear medicine study. The largest nodule measures 1.5 cm in diameter. Findings meet consensus criteria for biopsy. Ultrasound-guided fine needle aspiration should be considered, as per the consensus statement: Management of Thyroid Nodules Detected at [place]: Society of Radiologists in [place]. Radiology 2005;  [id number].   Electronically Signed   By: Marijo Conception, M.D.   On: 06/03/2014 16:56   US Abdomen Complete  06/02/2014   CLINICAL DATA:  History of pancreatitis, epigastric pain, some nausea  EXAM: ULTRASOUND ABDOMEN COMPLETE  COMPARISON:  Acute abdomen films of 06/01/2014 and CT abdomen pelvis of 05/23/2014  FINDINGS: Gallbladder: The gallbladder is visualized and no gallstones are noted. There is no pain over the gallbladder with compression  Common bile duct: Diameter: The common bile duct is normal measuring 4.7 mm.  Liver: The liver has a normal echogenic pattern. No focal hepatic abnormality is seen.  IVC: No abnormality visualized.  Pancreas: The pancreas is largely obscured by bowel gas and cannot be evaluated.  Spleen: The spleen is normal measuring 4.9 cm.  Right Kidney: Length: 11.3 cm.  No hydronephrosis is seen.  Left Kidney: Length: 11.0 cm.  No hydronephrosis is seen.  Abdominal aorta: The abdominal aorta is normal in caliber.  Other findings: None.  IMPRESSION: 1. No gallstones. 2. No abnormality of the liver is seen.  3. The pancreas is largely obscured by bowel gas.   Electronically Signed   By: Ivar Drape M.D.   On: 06/02/2014 14:05   US Pelvis Complete  06/03/2014   CLINICAL DATA:  Right ovarian cyst per CT.  EXAM: TRANSABDOMINAL ULTRASOUND OF PELVIS  TECHNIQUE: Transabdominal ultrasound examination of the pelvis was performed including evaluation of the uterus, ovaries, adnexal regions, and pelvic cul-de-sac.  COMPARISON:  CT 05/23/2014  FINDINGS: Uterus  Measurements: Prior hysterectomy.  Endometrium  Thickness: N/A.  Right ovary  Measurements: 4.6 x 2.3 x 1.6 cm. Normal appearance/no adnexal mass.  Left ovary  Measurements: Not visualized.  Other findings: No free fluid. Patient refused transvaginal imaging.  IMPRESSION: No adnexal masses seen.  Left ovary not visualized.  Prior hysterectomy.   Electronically Signed   By: Rolm Baptise M.D.   On: 06/03/2014 15:35   US Abdomen  Limited  05/23/2014   CLINICAL DATA:  49 year old female with subacute right upper quadrant pain, vomiting and weight loss. Initial encounter.  EXAM: US ABDOMEN LIMITED - RIGHT UPPER QUADRANT  COMPARISON:  05/11/2014 and prior CTs.  FINDINGS: Gallbladder:  The gallbladder is unremarkable. There is no evidence of cholelithiasis or acute cholecystitis.  Common bile duct:  Diameter: 3.0 mm. There is no evidence of intrahepatic or extrahepatic biliary dilatation.  Liver:  No focal lesion identified. Within normal limits in parenchymal echogenicity.  There is no evidence of ascites within the right upper abdomen.  IMPRESSION: Unremarkable right upper quadrant abdominal ultrasound.   Electronically Signed   By: Margarette Canada M.D.   On: 05/23/2014 13:21   US Thyroid Biopsy  06/05/2014   CLINICAL DATA:  RIGHT thyroid nodules  EXAM: ULTRASOUND GUIDED FINE NEEDLE ASPIRATE BIOPSY OF A RIGHT THYROID NODULE  MEDICATIONS: 3 mL of 1% lidocaine local anesthesia  PROCEDURE: Procedure, risks, benefits and alternatives discussed with the patient.  Patient's questions answered.  Written informed consent for thyroid nodule FNA obtained.  Time-out protocol followed.  Dominant nodule in inferior RIGHT thyroid lobe localized by ultrasound  Nodule measures 1.6 cm asthma diameter.  Skin prepped and draped in usual sterile fashion.  Skin and overlying soft tissues anesthetized with 3 mL of 1% lidocaine.  Under direct sonographic visualization, 4 fine-needle aspirates of the RIGHT lobe nodule were obtained.  Procedure tolerated well by patient.  Specimen sent to cytology for evaluation.  COMPLICATIONS: None  FINDINGS: As above  IMPRESSION: Ultrasound-guided FNA of an inferior pole RIGHT lobe thyroid nodule.   Electronically Signed   By: Lavonia Dana M.D.   On: 06/05/2014 08:11   Dg Chest Port 1 View  06/09/2014   CLINICAL DATA:  Nausea, vomiting, abdominal pain  EXAM: PORTABLE CHEST - 1 VIEW  COMPARISON:  06/01/2014  FINDINGS:  Cardiomediastinal silhouette is stable. No acute infiltrate or pleural effusion. No pulmonary edema. Bony thorax is unremarkable.  IMPRESSION: No active disease.   Electronically Signed   By: Lahoma Crocker M.D.   On: 06/09/2014 16:09   Dg Chest Port 1 View  05/24/2014   CLINICAL DATA:  Feeling very weak para nausea and vomiting, diarrhea for 3 weeks.  EXAM: PORTABLE CHEST - 1 VIEW  COMPARISON:  05/11/2014  FINDINGS: Heart size is normal. Lungs are clear. No pulmonary edema. Visualized osseous structures have a normal appearance.  IMPRESSION: No active disease.   Electronically Signed   By: Nolon Nations M.D.   On: 05/24/2014 15:08   Dg Abd Acute W/chest  06/01/2014   CLINICAL  DATA:  49 year old female with weakness.  EXAM: ACUTE ABDOMEN SERIES (ABDOMEN 2 VIEW & CHEST 1 VIEW)  COMPARISON:  Chest x-ray 05/24/2014. CT of the abdomen and pelvis 05/23/2014.  FINDINGS: Lung volumes are normal. No consolidative airspace disease. No pleural effusions. No pneumothorax. No pulmonary nodule or mass noted. Pulmonary vasculature and the cardiomediastinal silhouette are within normal limits.  Gas and stool are seen scattered throughout the colon extending to the level of the distal rectum. No pathologic distension of small bowel is noted. No gross evidence of pneumoperitoneum.  IMPRESSION: 1.  Nonobstructive bowel gas pattern. 2. No pneumoperitoneum. 3. No radiographic evidence of acute cardiopulmonary disease.   Electronically Signed   By: Vinnie Langton M.D.   On: 06/01/2014 16:38   Dg Abd Portable 1v  06/09/2014   CLINICAL DATA:  Nausea, vomiting, abdominal pain  EXAM: PORTABLE ABDOMEN - 1 VIEW  COMPARISON:  06/01/2014  FINDINGS: Mild gaseous distended small bowel loops are noted mid abdomen without air-fluid levels. Ileus or enteritis cannot be excluded. No free abdominal air is noted.  IMPRESSION: Mild gaseous distended small bowel loops mid abdomen without air-fluid levels. Ileus or enteritis cannot be excluded.  Clinical correlation is necessary. No free abdominal air.   Electronically Signed   By: Lahoma Crocker M.D.   On: 06/09/2014 16:10    Microbiology: Recent Results (from the past 240 hour(s))  Urine culture     Status: None   Collection Time: 06/09/14 11:10 AM  Result Value Ref Range Status   Specimen Description URINE, CLEAN CATCH  Final   Special Requests NONE  Final   Colony Count   Final    >=100,000 COLONIES/ML Performed at [facility]    Culture   Final    Multiple bacterial morphotypes present, none predominant. Suggest appropriate recollection if clinically indicated. Performed at [facility]    Report Status 06/10/2014 FINAL  Final  MRSA PCR Screening     Status: None   Collection Time: 06/09/14  3:22 PM  Result Value Ref Range Status   MRSA by PCR NEGATIVE NEGATIVE Final    Comment:        The GeneXpert MRSA Assay (FDA approved for NASAL specimens only), is one component of a comprehensive MRSA colonization surveillance program. It is not intended to diagnose MRSA infection nor to guide or monitor treatment for MRSA infections.      Labs: Basic Metabolic Panel:  Recent Labs Lab 06/10/14 0306 06/11/14 0756 06/13/14 0655 06/14/14 0643 06/15/14 0551  NA 140 140 141 138 140  K 4.6 3.1* 3.0* 3.5 4.3  CL 113* 110 106 106 106  CO2 23 24 29 26 27   GLUCOSE 177* 119* 99 135* 120*  BUN 13 10 10 10 11   CREATININE 0.49* 0.41* 0.39* 0.51 0.44*  CALCIUM 9.1 9.3 9.2 9.2 9.4  MG  --   --  1.4*  --   --    Liver Function Tests:  Recent Labs Lab 06/09/14 1114 06/11/14 0756  AST 34 19  ALT 25 20  ALKPHOS 58 47  BILITOT 0.8 0.3  PROT 7.8 6.0  ALBUMIN 3.7 3.1*    Recent Labs Lab 06/10/14 1041  LIPASE 97*   No results for input(s): AMMONIA in the last 168 hours. CBC:  Recent Labs Lab 06/09/14 1114 06/10/14 0306 06/11/14 0756 06/15/14 0551  WBC 5.6 4.5 8.1 9.1  NEUTROABS 2.2  --   --   --   HGB 12.5 9.1* 9.0* 9.7*  HCT 37.7 27.5*  27.2* 29.7*  MCV 86.7 86.2 86.3 86.3  PLT 286 207 198 223   Cardiac Enzymes:  Recent Labs Lab 06/09/14 1114 06/09/14 2107 06/10/14 0306  TROPONINI <0.03 <0.03 <0.03   BNP: BNP (last 3 results)  Recent Labs  06/09/14 1114  BNP 5.0    ProBNP (last 3 results) No results for input(s): PROBNP in the last 8760 hours.  CBG:  Recent Labs Lab 06/10/14 2040 06/11/14 0844 06/11/14 1650 06/11/14 2203 06/12/14 0735  GLUCAP 216* 142* 155* 139* 113*       Signed:  Adolf Ormiston  [facility] 06/15/2014, 11:36 AM

## 2014-06-15 NOTE — Care Management Utilization Note (Signed)
UR completed 

## 2014-06-15 NOTE — Progress Notes (Signed)
NURSING PROGRESS NOTE  [name] [id number] Discharge Data: 06/15/2014 1:11 PM Attending Provider: No att. providers found PCP:No primary care provider on file.   [name] to be D/C'd Home per MD order.    All IV's discontinued and monitored for bleeding.  All belongings returned to patient for patient to take home.  AVS summary and prescriptions reviewed with patient and her spouse. Reviewed handouts on Hyperthyroidism and PTU.   Follow up appointment with Dr. Dorris Fetch made for June 18, 2014 at 8:30AM.  Patient left floor via wheelchair, escorted by NT.  Last Documented Vital Signs:  Blood pressure 162/80, pulse 67, temperature 98.6 F (37 C), temperature source Oral, resp. rate 17, height 5\' 4"  (1.626 m), weight 54.9 kg (121 lb 0.5 oz), SpO2 100 %.  [name] D

## 2014-06-29 ENCOUNTER — Other Ambulatory Visit (HOSPITAL_COMMUNITY): Payer: Self-pay | Admitting: "Endocrinology

## 2014-06-29 DIAGNOSIS — E0590 Thyrotoxicosis, unspecified without thyrotoxic crisis or storm: Secondary | ICD-10-CM

## 2014-07-02 ENCOUNTER — Ambulatory Visit (HOSPITAL_COMMUNITY): Payer: 59

## 2014-07-02 ENCOUNTER — Encounter (HOSPITAL_COMMUNITY)
Admission: RE | Admit: 2014-07-02 | Discharge: 2014-07-02 | Disposition: A | Discharge status: Home / Self Care | Payer: 59 | Source: Ambulatory Visit | Attending: "Endocrinology | Admitting: "Endocrinology

## 2014-07-02 ENCOUNTER — Encounter (HOSPITAL_COMMUNITY): Payer: Self-pay

## 2014-07-02 DIAGNOSIS — E0590 Thyrotoxicosis, unspecified without thyrotoxic crisis or storm: Secondary | ICD-10-CM | POA: Diagnosis present

## 2014-07-02 MED ORDER — SODIUM IODIDE I 131 CAPSULE
15.0000 | Freq: Once | INTRAVENOUS | Status: AC | PRN
  Administered 2014-07-02: 15.4 via ORAL

## 2014-10-22 ENCOUNTER — Other Ambulatory Visit: Payer: Self-pay

## 2014-10-22 DIAGNOSIS — Z1231 Encounter for screening mammogram for malignant neoplasm of breast: Secondary | ICD-10-CM

## 2014-11-27 ENCOUNTER — Ambulatory Visit
Admission: RE | Admit: 2014-11-27 | Discharge: 2014-11-27 | Disposition: A | Discharge status: Home / Self Care | Payer: 59 | Source: Ambulatory Visit

## 2014-11-27 DIAGNOSIS — Z1231 Encounter for screening mammogram for malignant neoplasm of breast: Secondary | ICD-10-CM

## 2015-01-13 ENCOUNTER — Ambulatory Visit (INDEPENDENT_AMBULATORY_CARE_PROVIDER_SITE_OTHER): Payer: 59 | Admitting: Endocrinology

## 2015-01-13 ENCOUNTER — Encounter: Payer: Self-pay | Admitting: Endocrinology

## 2015-01-13 VITALS — BP 126/88 | HR 88 | Temp 98.9°F | Ht 64.0 in | Wt 143.0 lb

## 2015-01-13 DIAGNOSIS — R7989 Other specified abnormal findings of blood chemistry: Secondary | ICD-10-CM

## 2015-01-13 DIAGNOSIS — T50905A Adverse effect of unspecified drugs, medicaments and biological substances, initial encounter: Secondary | ICD-10-CM

## 2015-01-13 DIAGNOSIS — R252 Cramp and spasm: Secondary | ICD-10-CM | POA: Diagnosis not present

## 2015-01-13 DIAGNOSIS — R739 Hyperglycemia, unspecified: Secondary | ICD-10-CM

## 2015-01-13 LAB — T4, FREE: FREE T4: 1.01 ng/dL (ref 0.60–1.60)

## 2015-01-13 LAB — BASIC METABOLIC PANEL
BUN: 14 mg/dL (ref 6–23)
CHLORIDE: 98 meq/L (ref 96–112)
CO2: 31 meq/L (ref 19–32)
Calcium: 10.5 mg/dL (ref 8.4–10.5)
Creatinine, Ser: 0.95 mg/dL (ref 0.40–1.20)
GFR: 80.64 mL/min (ref >60.00)
Glucose, Bld: 119 mg/dL — ABNORMAL HIGH (ref 70–99)
POTASSIUM: 3.6 meq/L (ref 3.5–5.1)
Sodium: 138 mEq/L (ref 135–145)

## 2015-01-13 LAB — T3, FREE: T3 FREE: 3.1 pg/mL (ref 2.3–4.2)

## 2015-01-13 LAB — TSH: TSH: 25.71 u[IU]/mL — ABNORMAL HIGH (ref 0.35–4.50)

## 2015-01-13 LAB — VITAMIN D 25 HYDROXY (VIT D DEFICIENCY, FRACTURES): VITD: 34.9 ng/mL (ref 30.00–100.00)

## 2015-01-13 NOTE — Progress Notes (Signed)
Subjective:    Patient ID: [name], female    DOB: 03-11-1966, 49 y.o.   MRN: [id number]  HPI In feb of 2016, pt was admitted with thyroid storm.  She had [place] and scan, which suggested she had a combination of Grave's dx and multinodular goiter.  She had bx, which showed BENIGN FOLLICULAR NODULE (BETHESDA CATEGORY II).  She was briefly rx'ed with PTU.  She then had I-131 rx.  In sept of 2016, TSH was noted to be high, and synthroid 75 mcg/d, was rx'ed.  2 weeks ago, she developed moderate cramping of the fingers, and assoc fatigue.  She has been on synthroid x 2 weeks.  Since then, sxs are not improved.  She says current sxs are similar to what she had when hyperthyroidism was dx'ed.  Past Medical History  Diagnosis Date  . Hypertension   . Asthma   . Hyperthyroidism   . Enlarged ovary     right CT 05/2014  . 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 Abdominal hysterectomy    . Ovarian cyst surgery    . Esophagogastroduodenoscopy N/A 05/26/2014    Procedure: ESOPHAGOGASTRODUODENOSCOPY (EGD);  Surgeon: Danie Binder, MD;  Location: [facility];  Service: Endoscopy;  Laterality: N/A;  . Balloon dilation N/A 05/26/2014    Procedure: BALLOON DILATION OF THE PYLORUS;  Surgeon: Danie Binder, MD;  Location: [facility];  Service: Endoscopy;  Laterality: N/A;    Social History   Social History  . Marital Status: Married    Spouse Name: N/A  . Number of Children: N/A  . Years of Education: N/A   Occupational History  . Not on file.   Social History Main Topics  . Smoking status: Never Smoker   . Smokeless tobacco: Not on file  . Alcohol Use: No  . Drug Use: No  . Sexual Activity: Yes    Birth Control/ Protection: Surgical   Other Topics Concern  . Not on file   Social History Narrative    Current Outpatient Prescriptions on File Prior to Visit  Medication Sig Dispense Refill  . hydrochlorothiazide (HYDRODIURIL) 25 MG tablet  Take 0.5 tablets (12.5 mg total) by mouth daily.  0   No current facility-administered medications on file prior to visit.    Allergies  Allergen Reactions  . Contrast Media [Iodinated Diagnostic Agents] Rash  . Iohexol Nausea And Vomiting    Pt. Vomited immediately after injection of Omni 350.    . Vicodin [Hydrocodone-Acetaminophen] Rash    Family History  Problem Relation Age of Onset  . Diabetes Mother   . Diabetes Father   . Hypertension Mother   . Hypertension Father   . Thyroid disease Mother   . Thyroid disease Sister   . Thyroid disease Brother     BP 126/88 mmHg  Pulse 88  Temp(Src) 98.9 F (37.2 C) (Oral)  Ht 5\' 4"  (1.626 m)  Wt 143 lb (64.864 kg)  BMI 24.53 kg/m2  SpO2 95%   Review of Systems denies depression, hair loss, myalgias, sob, constipation, numbness, blurry vision, dry skin, rhinorrhea, easy bruising, and syncope.  She has nausea, poor appetite, weight gain, heat intolerance, excessive diaphoresis, and excessive diaphoresis.      Objective:   Physical Exam VS: see vs page GEN: no distress HEAD: head: no deformity eyes: no periorbital swelling, no proptosis.  external nose and ears are normal mouth: no lesion seen.  NECK:  supple, thyroid is not enlarged CHEST WALL: no deformity.  LUNGS:  Clear to auscultation.  CV: reg rate and rhythm, no murmur.  ABD: abdomen is soft, nontender.  no hepatosplenomegaly.  not distended.  no hernia.  MUSCULOSKELETAL: muscle bulk and strength are grossly normal.  no obvious joint swelling.  gait is normal and steady EXTEMITIES: no deformity.  no ulcer on the feet.  feet are of normal color and temp.  no edema PULSES: dorsalis pedis intact bilat.  no carotid bruit NEURO:  cn 2-12 grossly intact.   readily moves all 4's.  sensation is intact to touch on the feet.  No tremor.  SKIN:  Normal texture and temperature.  No rash or suspicious lesion is visible.   NODES:  None palpable at the neck PSYCH: alert,  well-oriented.  Does not appear anxious nor depressed.   I have reviewed outside records, and summarized: Pt was noted to have above sxs, and elevated TSH.  She was rx'ed synthroid.  When sxs did not improve, she was ref here.    outside test results are reviewed: TSH=53 Free T4=0.29  Lab Results  Component Value Date   TSH 25.71* 01/13/2015   T4TOTAL 15.1* 05/23/2014   Lab Results  Component Value Date   PTH 25 01/13/2015   CALCIUM 10.5 01/13/2015   CALCIUM 10.6* 01/13/2015   Lab Results  Component Value Date   CREATININE 0.95 01/13/2015   BUN 14 01/13/2015   NA 138 01/13/2015   K 3.6 01/13/2015   CL 98 01/13/2015   CO2 31 01/13/2015      Assessment & Plan:  Post-I-131 hypothyroidism, improved with synthroid.  Please continue the same medication Multinodular goiter: she'll need recheck of the [place] next year. Hypercalcemia: new, uncertain etiology.  We'll follow. Cramps, and other sxs, uncertain etiology.  I encouraged pt to continue the same medication, and we'll follow sxs.    Patient is advised the following: Patient Instructions  blood tests are requested for you today.  We'll let you know about the results.     addendum: Please continue the same medication.  Please come back for a follow-up appointment in 2 weeks.

## 2015-01-13 NOTE — Patient Instructions (Addendum)
blood tests are requested for you today.  We'll let you know about the results.  

## 2015-01-14 LAB — PTH, INTACT AND CALCIUM
Calcium: 10.6 mg/dL — ABNORMAL HIGH (ref 8.4–10.5)
PTH: 25 pg/mL (ref 14–64)

## 2015-01-27 ENCOUNTER — Ambulatory Visit (INDEPENDENT_AMBULATORY_CARE_PROVIDER_SITE_OTHER): Payer: 59 | Admitting: Endocrinology

## 2015-01-27 ENCOUNTER — Encounter: Payer: Self-pay | Admitting: Endocrinology

## 2015-01-27 VITALS — BP 130/88 | HR 70 | Temp 98.2°F | Wt 144.0 lb

## 2015-01-27 DIAGNOSIS — E890 Postprocedural hypothyroidism: Secondary | ICD-10-CM | POA: Diagnosis not present

## 2015-01-27 LAB — TSH: TSH: 11.04 u[IU]/mL — AB (ref 0.35–4.50)

## 2015-01-27 LAB — T4, FREE: FREE T4: 1.05 ng/dL (ref 0.60–1.60)

## 2015-01-27 MED ORDER — LEVOTHYROXINE SODIUM 112 MCG PO TABS: 112.0000 ug | ORAL_TABLET | Freq: Every day | ORAL | Status: DC

## 2015-01-27 NOTE — Patient Instructions (Addendum)
blood tests are requested for you today.  We'll let you know about the results.   Based on the results, we will probably need to increase the thyroid medication Please come back for a follow-up appointment in 6 weeks.

## 2015-01-27 NOTE — Progress Notes (Signed)
Subjective:    Patient ID: [name], female    DOB: February 12, 1966, 49 y.o.   MRN: [id number]  HPI Pt returns for f/u of hyperthyroidism (in feb of 2016, pt was admitted with thyroid storm; she had [place] and scan, which suggested she had a combination of Grave's dx and multinodular goiter; she had bx, which showed BENIGN FOLLICULAR NODULE (BETHESDA CATEGORY II); she was briefly rx'ed with PTU, and then had I-131 rx; in sept of 2016, TSH was noted to be high, and synthroid was rx'ed). Pt says she is starting to feel better recently.  Main symptom is excessive diaphoresis Past Medical History  Diagnosis Date  . Hypertension   . Asthma   . Hyperthyroidism   . Enlarged ovary     right CT 05/2014  . 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 Abdominal hysterectomy    . Ovarian cyst surgery    . Esophagogastroduodenoscopy N/A 05/26/2014    Procedure: ESOPHAGOGASTRODUODENOSCOPY (EGD);  Surgeon: Danie Binder, MD;  Location: [facility];  Service: Endoscopy;  Laterality: N/A;  . Balloon dilation N/A 05/26/2014    Procedure: BALLOON DILATION OF THE PYLORUS;  Surgeon: Danie Binder, MD;  Location: [facility];  Service: Endoscopy;  Laterality: N/A;    Social History   Social History  . Marital Status: Married    Spouse Name: N/A  . Number of Children: N/A  . Years of Education: N/A   Occupational History  . Not on file.   Social History Main Topics  . Smoking status: Never Smoker   . Smokeless tobacco: Not on file  . Alcohol Use: No  . Drug Use: No  . Sexual Activity: Yes    Birth Control/ Protection: Surgical   Other Topics Concern  . Not on file   Social History Narrative    Current Outpatient Prescriptions on File Prior to Visit  Medication Sig Dispense Refill  . hydrochlorothiazide (HYDRODIURIL) 25 MG tablet Take 0.5 tablets (12.5 mg total) by mouth daily.  0   No current facility-administered medications on file prior to visit.      Allergies  Allergen Reactions  . Contrast Media [Iodinated Diagnostic Agents] Rash  . Iohexol Nausea And Vomiting    Pt. Vomited immediately after injection of Omni 350.    . Vicodin [Hydrocodone-Acetaminophen] Rash    Family History  Problem Relation Age of Onset  . Diabetes Mother   . Diabetes Father   . Hypertension Mother   . Hypertension Father   . Thyroid disease Mother   . Thyroid disease Sister   . Thyroid disease Brother     BP 130/88 mmHg  Pulse 70  Temp(Src) 98.2 F (36.8 C) (Oral)  Wt 144 lb (65.318 kg)  SpO2 99%  Review of Systems She denies fever    Objective:   Physical Exam VITAL SIGNS:  See vs page GENERAL: no distress SKIN: not diaphoretic NEURO: no tremor.    Lab Results  Component Value Date   TSH 11.04* 01/27/2015   T4TOTAL 15.1* 05/23/2014      Assessment & Plan:  Post-I-131 hypothyroidism: improved, but she needs increased rx.   Patient is advised the following: Patient Instructions  blood tests are requested for you today.  We'll let you know about the results.   Based on the results, we will probably need to increase the thyroid medication Please come back for a follow-up appointment in 6 weeks.  addendum: i have sent a prescription to your pharmacy, to increase synthroid.

## 2015-03-01 LAB — TSH: TSH: 0.11 u[IU]/mL — AB (ref <=5.90)

## 2015-03-10 ENCOUNTER — Encounter: Payer: Self-pay | Admitting: Endocrinology

## 2015-03-10 ENCOUNTER — Ambulatory Visit (INDEPENDENT_AMBULATORY_CARE_PROVIDER_SITE_OTHER): Payer: 59 | Admitting: Endocrinology

## 2015-03-10 VITALS — BP 132/87 | HR 76 | Temp 98.3°F | Ht 64.0 in | Wt 142.0 lb

## 2015-03-10 DIAGNOSIS — E890 Postprocedural hypothyroidism: Secondary | ICD-10-CM | POA: Diagnosis not present

## 2015-03-10 MED ORDER — LEVOTHYROXINE SODIUM 100 MCG PO TABS
100.0000 ug | ORAL_TABLET | Freq: Every day | ORAL | Status: DC
Start: 2015-03-10 — End: 2015-04-14

## 2015-03-10 NOTE — Patient Instructions (Addendum)
Please reduce the thyroid pill.  i have sent a prescription to your pharmacy If your thumb symptoms persist, I would be happy to refer you to a specialist.  Please come back for a follow-up appointment in 3 months.

## 2015-03-10 NOTE — Progress Notes (Signed)
Subjective:    Patient ID: [name], female    DOB: [birth year], 49 y.o.   MRN: [id number]  HPI Pt returns for f/u of post-I-131 hypothyroidism (in feb of 2016, pt was admitted with thyroid storm; she had [place] and scan, which suggested she had a combination of Grave's dx and multinodular goiter; she had bx, which showed BENIGN FOLLICULAR NODULE (BETHESDA CATEGORY II); she was briefly rx'ed with PTU, and then had I-131 rx; in March of 2016, TSH was noted to be high, and synthroid was rx'ed).  She has fatigue.  She reports 1 year of the right thumb slightly "locking up," but no assoc pain. Past Medical History  Diagnosis Date  . Hypertension   . Asthma   . Hyperthyroidism   . Enlarged ovary     right CT 05/2014  . 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 Abdominal hysterectomy    . Ovarian cyst surgery    . Esophagogastroduodenoscopy N/A 05/26/2014    Procedure: ESOPHAGOGASTRODUODENOSCOPY (EGD);  Surgeon: Danie Binder, MD;  Location: [facility];  Service: Endoscopy;  Laterality: N/A;  . Balloon dilation N/A 05/26/2014    Procedure: BALLOON DILATION OF THE PYLORUS;  Surgeon: Danie Binder, MD;  Location: [facility];  Service: Endoscopy;  Laterality: N/A;    Social History   Social History  . Marital Status: Married    Spouse Name: N/A  . Number of Children: N/A  . Years of Education: N/A   Occupational History  . Not on file.   Social History Main Topics  . Smoking status: Never Smoker   . Smokeless tobacco: Not on file  . Alcohol Use: No  . Drug Use: No  . Sexual Activity: Yes    Birth Control/ Protection: Surgical   Other Topics Concern  . Not on file   Social History Narrative    Current Outpatient Prescriptions on File Prior to Visit  Medication Sig Dispense Refill  . hydrochlorothiazide (HYDRODIURIL) 25 MG tablet Take 0.5 tablets (12.5 mg total) by mouth daily.  0   No current facility-administered medications on  file prior to visit.    Allergies  Allergen Reactions  . Contrast Media [Iodinated Diagnostic Agents] Rash  . Iohexol Nausea And Vomiting    Pt. Vomited immediately after injection of Omni 350.    . Vicodin [Hydrocodone-Acetaminophen] Rash    Family History  Problem Relation Age of Onset  . Diabetes Mother   . Diabetes Father   . Hypertension Mother   . Hypertension Father   . Thyroid disease Mother   . Thyroid disease Sister   . Thyroid disease Brother     BP 132/87 mmHg  Pulse 76  Temp(Src) 98.3 F (36.8 C) (Oral)  Ht 5\' 4"  (1.626 m)  Wt 142 lb (64.411 kg)  BMI 24.36 kg/m2  SpO2 98%  Review of Systems No weight change.  No fever    Objective:   Physical Exam VITAL SIGNS:  See vs page GENERAL: no distress NECK: There is no palpable thyroid enlargement.  No thyroid nodule is palpable.  No palpable lymphadenopathy at the anterior neck.   outside test results are reviewed: TSH=0.11    Assessment & Plan:  Post-I-131 hypothyroidism, slightly overreplaced Trigger finger, new to me, unlikely thyroid-related  Patient is advised the following: Patient Instructions  Please reduce the thyroid pill.  i have sent a prescription to your pharmacy If your thumb symptoms persist, I  would be happy to refer you to a specialist.  Please come back for a follow-up appointment in 3 months.

## 2015-04-14 ENCOUNTER — Ambulatory Visit: Payer: 59 | Admitting: Endocrinology

## 2015-04-14 ENCOUNTER — Encounter: Payer: Self-pay | Admitting: Endocrinology

## 2015-04-14 ENCOUNTER — Ambulatory Visit (INDEPENDENT_AMBULATORY_CARE_PROVIDER_SITE_OTHER): Payer: 59 | Admitting: Endocrinology

## 2015-04-14 VITALS — BP 122/87 | HR 98 | Temp 98.2°F | Ht 64.0 in | Wt 138.0 lb

## 2015-04-14 DIAGNOSIS — E890 Postprocedural hypothyroidism: Secondary | ICD-10-CM | POA: Diagnosis not present

## 2015-04-14 MED ORDER — LEVOTHYROXINE SODIUM 50 MCG PO TABS: 50.0000 ug | ORAL_TABLET | Freq: Every day | ORAL | Status: DC

## 2015-04-14 NOTE — Patient Instructions (Addendum)
I'm not sure if the thyroid is the cause of your symptoms, so we should carefully follow this.  Another reason we should carefully follow this is that the overactive thyroid could be coming back.   Please reduce the thyroid pill. I have sent a prescription to your pharmacy.   Please come back for a follow-up appointment in approx 1 month.

## 2015-04-14 NOTE — Progress Notes (Signed)
Subjective:    Patient ID: [name], female    DOB: 1965/10/18, 50 y.o.   MRN: [id number]  HPI Pt returns for f/u of post-I-131 hypothyroidism (in feb of 2016, pt was admitted with thyroid storm; she had [place] and scan, which suggested she had a combination of Grave's dx and multinodular goiter; she had bx, which showed BENIGN FOLLICULAR NODULE (BETHESDA CATEGORY II); she was briefly rx'ed with PTU, and then had I-131 rx; in March of 2016, TSH was noted to be high, and synthroid was rx'ed).  She was seen in ER 4 days ago with near-syncopal episode.  She has assoc nausea.  She says she never misses the synthroid.  Since then, she feels somewhat better, but is not back to normal.   Past Medical History  Diagnosis Date  . Hypertension   . Asthma   . Hyperthyroidism   . Enlarged ovary     right CT 05/2014  . 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 Abdominal hysterectomy    . Ovarian cyst surgery    . Esophagogastroduodenoscopy N/A 05/26/2014    Procedure: ESOPHAGOGASTRODUODENOSCOPY (EGD);  Surgeon: Danie Binder, MD;  Location: [facility];  Service: Endoscopy;  Laterality: N/A;  . Balloon dilation N/A 05/26/2014    Procedure: BALLOON DILATION OF THE PYLORUS;  Surgeon: Danie Binder, MD;  [facility]: [facility];  Service: Endoscopy;  Laterality: N/A;    Social History   Social History  . Marital Status: Married    Spouse Name: N/A  . Number of Children: N/A  . Years of Education: N/A   Occupational History  . Not on file.   Social History Main Topics  . Smoking status: Never Smoker   . Smokeless tobacco: Not on file  . Alcohol Use: No  . Drug Use: No  . Sexual Activity: Yes    Birth Control/ Protection: Surgical   Other Topics Concern  . Not on file   Social History Narrative    Current Outpatient Prescriptions on File Prior to Visit  Medication Sig Dispense Refill  . hydrochlorothiazide (HYDRODIURIL) 25 MG tablet Take 0.5  tablets (12.5 mg total) by mouth daily.  0   No current facility-administered medications on file prior to visit.    Allergies  Allergen Reactions  . Contrast Media [Iodinated Diagnostic Agents] Rash  . Iohexol Nausea And Vomiting    Pt. Vomited immediately after injection of Omni 350.    . Vicodin [Hydrocodone-Acetaminophen] Rash    Family History  Problem Relation Age of Onset  . Diabetes Mother   . Diabetes Father   . Hypertension Mother   . Hypertension Father   . Thyroid disease Mother   . Thyroid disease Sister   . Thyroid disease Brother     BP 122/87 mmHg  Pulse 98  Temp(Src) 98.2 F (36.8 C) (Oral)  Ht 5\' 4"  (1.626 m)  Wt 138 lb (62.596 kg)  BMI 23.68 kg/m2  SpO2 92%  Review of Systems Denies fever    Objective:   Physical Exam VITAL SIGNS:  See vs page GENERAL: no distress NECK: There is no palpable thyroid enlargement.  No thyroid nodule is palpable.  No palpable lymphadenopathy at the anterior neck.   outside test results are reviewed: TSH=0.15    Assessment & Plan:  Post-I-131 hypothyroidism, slightly overreplaced.  This raises the possibility she could develop recurrent hyperthyroidism Near-syncopal episode, unlikely thyroid-related.    Patient is advised  the following: Patient Instructions  I'm not sure if the thyroid is the cause of your symptoms, so we should carefully follow this.  Another reason we should carefully follow this is that the overactive thyroid could be coming back.   Please reduce the thyroid pill. I have sent a prescription to your pharmacy.   Please come back for a follow-up appointment in approx 1 month.

## 2015-05-11 ENCOUNTER — Other Ambulatory Visit (INDEPENDENT_AMBULATORY_CARE_PROVIDER_SITE_OTHER): Payer: 59

## 2015-05-11 DIAGNOSIS — E890 Postprocedural hypothyroidism: Secondary | ICD-10-CM | POA: Diagnosis not present

## 2015-05-11 LAB — T4, FREE: Free T4: 0.69 ng/dL (ref 0.60–1.60)

## 2015-05-11 LAB — TSH: TSH: 25.67 u[IU]/mL — ABNORMAL HIGH (ref 0.35–4.50)

## 2015-05-14 ENCOUNTER — Ambulatory Visit (INDEPENDENT_AMBULATORY_CARE_PROVIDER_SITE_OTHER): Payer: 59 | Admitting: Endocrinology

## 2015-05-14 ENCOUNTER — Ambulatory Visit: Payer: 59 | Admitting: Endocrinology

## 2015-05-14 ENCOUNTER — Encounter: Payer: Self-pay | Admitting: Endocrinology

## 2015-05-14 VITALS — BP 110/76 | HR 89 | Temp 98.7°F | Resp 14 | Ht 64.0 in | Wt 143.2 lb

## 2015-05-14 DIAGNOSIS — I10 Essential (primary) hypertension: Secondary | ICD-10-CM | POA: Diagnosis not present

## 2015-05-14 DIAGNOSIS — E890 Postprocedural hypothyroidism: Secondary | ICD-10-CM | POA: Diagnosis not present

## 2015-05-14 MED ORDER — LEVOTHYROXINE SODIUM 88 MCG PO TABS: 88.0000 ug | ORAL_TABLET | Freq: Every day | ORAL | Status: DC

## 2015-05-14 NOTE — Progress Notes (Signed)
Patient ID: [name], female   DOB: 08-Oct-1965, 50 y.o.   MRN: [id number]            Reason for Appointment:  Hypothyroidism, new visit    History of Present Illness:   She was treated for hyperthyroidism in 05/2014; this was diagnosed when she was admitted to the hospital with nausea, weight loss of 20-25 pounds and constipation.  She was then found to have significant tachycardia and Hyperthyroidism  was diagnosed She got 15 mCi in 06/2014 and subsequently got hypothyroid, details of when she first went on thyroid supplements are unknown. She was initially treated with 75 g of levothyroxine Subsequently been TSH was 11 this was increased to 112 g which however brought her TSH down to 0.11 She was then started on 100 g of levothyroxine  In 04/2015 she had an episode of syncope along with symptoms of nausea and was evaluated in an emergency room in [place] where she was found to have a TSH of 0.15 Subsequently has been on 50 g of levothyroxine  More recently the patient has been having difficulty with significant fatigue, her weight has been variable but slightly lower recently from her maximum of 149 She also has difficulty sleeping which is not new She has continued nausea and somewhat decreased appetite.  Also complains of constipation No significant symptoms of cold intolerance, feels cold only occasionally.  Mostly tends to feel a little warm and at times will have waves of heat She also complains of feeling dizzy which is more of a sensation of imbalance especially on standing up along with some blurred vision No further episodes of syncope  Patient's weight history is as follows:  Wt Readings from Last 3 Encounters:  05/14/15 143 lb 3.2 oz (64.955 kg)  04/14/15 138 lb (62.596 kg)  03/10/15 142 lb (64.411 kg)    Thyroid function results have been as follows:  Lab Results  Component Value Date   TSH 25.67* 05/11/2015   TSH 0.11* 03/01/2015   TSH 11.04* 01/27/2015   FREET4 0.69 05/11/2015   FREET4 1.05 01/27/2015   FREET4 1.01 01/13/2015    Lab Results  Component Value Date   TSH 25.67* 05/11/2015   TSH 0.11* 03/01/2015   TSH 11.04* 01/27/2015   TSH 25.71* 01/13/2015   TSH <0.010* 06/13/2014   TSH 0.011* 06/01/2014   TSH <0.010* 05/23/2014     Past Medical History  Diagnosis Date  . Hypertension   . Asthma   . Hyperthyroidism   . Enlarged ovary     right CT 05/2014  . 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 Abdominal hysterectomy    . Ovarian cyst surgery    . Esophagogastroduodenoscopy N/A 05/26/2014    Procedure: ESOPHAGOGASTRODUODENOSCOPY (EGD);  Surgeon: Danie Binder, MD;  Location: [facility];  Service: Endoscopy;  Laterality: N/A;  . Balloon dilation N/A 05/26/2014    Procedure: BALLOON DILATION OF THE PYLORUS;  Surgeon: Danie Binder, MD;  Location: [facility];  Service: Endoscopy;  Laterality: N/A;    Family History  Problem Relation Age of Onset  . Diabetes Mother   . Diabetes Father   . Hypertension Mother   . Hypertension Father   . Thyroid disease Mother   . Thyroid disease Sister   . Thyroid disease Brother     Social History:  reports that she has never smoked. She does not have any smokeless tobacco history on file.  She reports that she does not drink alcohol or use illicit drugs.  Allergies:  Allergies  Allergen Reactions  . Contrast Media [Iodinated Diagnostic Agents] Rash  . Iohexol Nausea And Vomiting    Pt. Vomited immediately after injection of Omni 350.    . Vicodin [Hydrocodone-Acetaminophen] Rash      Medication List       This list is accurate as of: 05/14/15 11:59 PM.  Always use your most recent med list.               hydrochlorothiazide 25 MG tablet  Commonly known as:  HYDRODIURIL  Take 0.5 tablets (12.5 mg total) by mouth daily.     levothyroxine 88 MCG tablet  Commonly known as:  SYNTHROID, LEVOTHROID  Take 1 tablet (88 mcg total)  by mouth daily.     ondansetron 4 MG disintegrating tablet  Commonly known as:  ZOFRAN-ODT  Reported on 05/14/2015     potassium chloride SA 15 MEQ tablet  Commonly known as:  KLOR-CON M15  Take 15 mEq by mouth 2 (two) times daily. Reported on 05/14/2015        Review of Systems:  Review of Systems  Constitutional: Positive for malaise.  Eyes: Positive for blurred vision.       Blurring of vision at times  Cardiovascular: Positive for palpitations. Negative for leg swelling.       Only rarely will have palpitations  Gastrointestinal: Positive for constipation.  Endocrine: Positive for fatigue and heat intolerance. Negative for cold intolerance and polydipsia.       Feels warm at times.  Occasionally feels cold also.   Does not consistently feel heat intolerance    Genitourinary: Negative for frequency.  Musculoskeletal: Negative for muscle aches.  Skin: Negative for rash.  Neurological: Positive for tremors. Negative for numbness and tingling.       Occasionally feels shaky Also at times feel dizzy/woozy.  No lightheadedness or faintness on standing up  Psychiatric/Behavioral: Negative for nervousness.                Examination:    BP 110/76 mmHg  Pulse 89  Temp(Src) 98.7 F (37.1 C)  Resp 14  Ht 5\' 4"  (1.626 m)  Wt 143 lb 3.2 oz (64.955 kg)  BMI 24.57 kg/m2  SpO2 99%  Standing blood pressure 122/84 Repeat pulse 76, regular  GENERAL:  Average build. ,  Looks well, not unusually anxious  No pallor, clubbing, lymphadenopathy or edema.  Skin:  no rash or pigmentation.  EYES:  No prominence of the eyes or swelling of the eyelids  ENT: Oral mucosa and tongue normal.  THYROID:  Not palpable.  HEART:  Normal  S1 and S2; no murmur or click.  CHEST:    Lungs: Vescicular breath sounds heard equally.  No crepitations/ wheeze.  ABDOMEN:  No distention.  Liver and spleen not palpable.  No other mass or tenderness.  Pelvic exam not indicated  NEUROLOGICAL: Motor  power appears normal in all 4 extremities Reflexes are normal bilaterally at biceps.  No tremor present.  JOINTS:  Normal peripheral joints   Assessment:  HYPOTHYROIDISM, secondary to radioactive iodine treatment Graves' disease in early 2016 She has been on variable doses of levothyroxine: Has had inadequate supplementation with initially with 75 g and then TSH has been mildly suppressed with doses of 112 and 100 g  Currently she is having significant fatigue but also other nonspecific symptoms that may be unrelated With 50 g dosage  of levothyroxine over the last month her TSH is now significantly higher at 25  Although she is having symptoms of intermittent nausea and at times weight loss and decreased appetite she does not appear to have orthostatic hypotension or any history of hyponatremia to suggest adrenal insufficiency  HYPERTENSION: Blood pressure is normal with HCTZ without orthostasis  PLAN:   Trial of Synthroid 88 g daily She will need to discuss symptoms of dizziness and blurred vision with PCP Follow-up in 6 weeks    Jarrin Staley 05/16/2015, 10:58 AM

## 2015-06-10 ENCOUNTER — Other Ambulatory Visit (INDEPENDENT_AMBULATORY_CARE_PROVIDER_SITE_OTHER): Payer: 59

## 2015-06-10 DIAGNOSIS — E890 Postprocedural hypothyroidism: Secondary | ICD-10-CM

## 2015-06-10 DIAGNOSIS — I10 Essential (primary) hypertension: Secondary | ICD-10-CM | POA: Diagnosis not present

## 2015-06-10 LAB — BASIC METABOLIC PANEL
BUN: 15 mg/dL (ref 6–23)
CALCIUM: 10 mg/dL (ref 8.4–10.5)
CO2: 32 meq/L (ref 19–32)
Chloride: 100 mEq/L (ref 96–112)
Creatinine, Ser: 0.86 mg/dL (ref 0.40–1.20)
GFR: 90.3 mL/min (ref >60.00)
Glucose, Bld: 103 mg/dL — ABNORMAL HIGH (ref 70–99)
Potassium: 3.6 mEq/L (ref 3.5–5.1)
SODIUM: 138 meq/L (ref 135–145)

## 2015-06-10 LAB — TSH: TSH: 0.96 u[IU]/mL (ref 0.35–4.50)

## 2015-06-10 LAB — T4, FREE: FREE T4: 1.58 ng/dL (ref 0.60–1.60)

## 2015-06-11 ENCOUNTER — Ambulatory Visit (INDEPENDENT_AMBULATORY_CARE_PROVIDER_SITE_OTHER): Payer: 59 | Admitting: Endocrinology

## 2015-06-11 ENCOUNTER — Encounter: Payer: Self-pay | Admitting: Endocrinology

## 2015-06-11 VITALS — BP 122/80 | HR 66 | Temp 98.5°F | Resp 14 | Ht 64.0 in | Wt 144.2 lb

## 2015-06-11 DIAGNOSIS — E890 Postprocedural hypothyroidism: Secondary | ICD-10-CM | POA: Diagnosis not present

## 2015-06-11 NOTE — Progress Notes (Signed)
Patient ID: [name], female   DOB: Apr 11, 1965, 51 y.o.   MRN: [id number]            Reason for Appointment:  Hypothyroidism, follow-up visit    History of Present Illness:   Background information: She was treated for hyperthyroidism in 05/2014; this was diagnosed when she was admitted to the hospital with nausea, weight loss of 20-25 pounds and constipation.  She was then found to have significant tachycardia and Hyperthyroidism  was diagnosed She got 15 mCi in 06/2014 and subsequently got hypothyroid, details of when she first went on thyroid supplements are unknown. She was initially treated with 75 g of levothyroxine Subsequently been TSH was 11 this was increased to 112 g which however brought her TSH down to 0.11 She was then started on 100 g of levothyroxine In 04/2015 she had an episode of syncope along with symptoms of nausea and was evaluated in an emergency room in [place] where she was found to have a TSH of 0.15 Subsequently had been on 50 g of levothyroxine which caused her to have significant fatigue, nausea and dizziness  RECENT history: Since her TSH was 25.7 her Synthroid was increased from 50 up to 88 g in  05/2015 She is now here for 1 month follow-up and says that she is feeling progressively better with her energy level and also does not have any significant nausea, dizziness or decreased appetite She is very compliant with taking her medication in the morning before breakfast with juice Her weight is about the same TSH is back to normal  Patient's weight history is as follows:  Wt Readings from Last 3 Encounters:  06/11/15 144 lb 3.2 oz (65.409 kg)  05/14/15 143 lb 3.2 oz (64.955 kg)  04/14/15 138 lb (62.596 kg)    Thyroid function results have been as follows:  Lab Results  Component Value Date   TSH 0.96 06/10/2015   TSH 25.67* 05/11/2015   TSH 0.11* 03/01/2015   FREET4 1.58 06/10/2015   FREET4 0.69 05/11/2015   FREET4 1.05 01/27/2015     Lab Results  Component Value Date   TSH 0.96 06/10/2015   TSH 25.67* 05/11/2015   TSH 0.11* 03/01/2015   TSH 11.04* 01/27/2015   TSH 25.71* 01/13/2015   TSH <0.010* 06/13/2014   TSH 0.011* 06/01/2014   TSH <0.010* 05/23/2014     Past Medical History  Diagnosis Date  . Hypertension   . Asthma   . Hyperthyroidism   . Enlarged ovary     right CT 05/2014  . 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 Abdominal hysterectomy    . Ovarian cyst surgery    . Esophagogastroduodenoscopy N/A 05/26/2014    Procedure: ESOPHAGOGASTRODUODENOSCOPY (EGD);  Surgeon: Danie Binder, MD;  [facility];  Service: Endoscopy;  Laterality: N/A;  . Balloon dilation N/A 05/26/2014    Procedure: BALLOON DILATION OF THE PYLORUS;  Surgeon: Danie Binder, MD;  [facility];  Service: Endoscopy;  Laterality: N/A;    Family History  Problem Relation Age of Onset  . Diabetes Mother   . Diabetes Father   . Hypertension Mother   . Hypertension Father   . Thyroid disease Mother   . Thyroid disease Sister   . Thyroid disease Brother     Social History:  reports that she has never smoked. She does not have any smokeless tobacco history on file. She reports that she  does not drink alcohol or use illicit drugs.  Allergies:  Allergies  Allergen Reactions  . Contrast Media [Iodinated Diagnostic Agents] Rash  . Iohexol Nausea And Vomiting    Pt. Vomited immediately after injection of Omni 350.    . Vicodin [Hydrocodone-Acetaminophen] Rash      Medication List       This list is accurate as of: 06/11/15 12:50 PM.  Always use your most recent med list.               hydrochlorothiazide 25 MG tablet  Commonly known as:  HYDRODIURIL  Take 0.5 tablets (12.5 mg total) by mouth daily.     levothyroxine 88 MCG tablet  Commonly known as:  SYNTHROID, LEVOTHROID  Take 1 tablet (88 mcg total) by mouth daily.     ondansetron 4 MG  disintegrating tablet  Commonly known as:  ZOFRAN-ODT  Reported on 06/11/2015     potassium chloride SA 15 MEQ tablet  Commonly known as:  KLOR-CON M15  Take 15 mEq by mouth 2 (two) times daily. Reported on 06/11/2015        Review of Systems:  Review of Systems  Eyes: Negative for blurred vision.  Endocrine: Positive for heat intolerance.  Psychiatric/Behavioral: Negative for insomnia.                Examination:    BP 122/80 mmHg  Pulse 66  Temp(Src) 98.5 F (36.9 C)  Resp 14  Ht 5\' 4"  (1.626 m)  Wt 144 lb 3.2 oz (65.409 kg)  BMI 24.74 kg/m2  SpO2 98%   She looks well Reflexes are normal bilaterally at biceps.      Assessment:  HYPOTHYROIDISM, secondary to radioactive iodine treatment Graves' disease in early 2016 She has been on variable doses of levothyroxine but now has TSH back to normal with 88 g dosage She is also significantly better symptomatically with increasing her dose in the last month She is compliant with her medication in the morning   HYPERTENSION: Blood pressure is normal with HCTZ without orthostatic symptoms  PLAN:   Continue the dose of Synthroid 88 g daily She will avoid any iron containing vitamins or calcium or orange juice fortified with calcium at the same time as her thyroid supplement  Follow-up in 3 months    Amilliana Hayworth 06/11/2015, 12:50 PM

## 2015-06-25 ENCOUNTER — Telehealth: Payer: Self-pay | Admitting: Endocrinology

## 2015-06-25 NOTE — Telephone Encounter (Signed)
Please see below and advise.

## 2015-06-25 NOTE — Telephone Encounter (Signed)
No

## 2015-06-25 NOTE — Telephone Encounter (Signed)
Noted, patient is aware. 

## 2015-06-25 NOTE — Telephone Encounter (Signed)
Patient called stating she is going to [place] for a mission trip and is having a series of vaccines  Are any of these vaccines going to counter act with her thyroid or medication?   Please advise   Thank you

## 2015-08-06 ENCOUNTER — Other Ambulatory Visit (HOSPITAL_COMMUNITY): Payer: Self-pay | Admitting: Chiropractic Medicine

## 2015-08-06 DIAGNOSIS — M9903 Segmental and somatic dysfunction of lumbar region: Secondary | ICD-10-CM

## 2015-08-12 ENCOUNTER — Ambulatory Visit (HOSPITAL_COMMUNITY)
Admission: RE | Admit: 2015-08-12 | Discharge: 2015-08-12 | Disposition: A | Discharge status: Home / Self Care | Payer: Commercial Managed Care - HMO | Source: Ambulatory Visit | Attending: Chiropractic Medicine | Admitting: Chiropractic Medicine

## 2015-08-12 ENCOUNTER — Other Ambulatory Visit: Payer: Self-pay

## 2015-08-12 DIAGNOSIS — M47896 Other spondylosis, lumbar region: Secondary | ICD-10-CM | POA: Insufficient documentation

## 2015-08-12 DIAGNOSIS — M5136 Other intervertebral disc degeneration, lumbar region: Secondary | ICD-10-CM | POA: Insufficient documentation

## 2015-08-12 DIAGNOSIS — M9903 Segmental and somatic dysfunction of lumbar region: Secondary | ICD-10-CM | POA: Diagnosis present

## 2015-08-12 MED ORDER — LEVOTHYROXINE SODIUM 88 MCG PO TABS: 88.0000 ug | ORAL_TABLET | Freq: Every day | ORAL | Status: DC

## 2015-09-03 ENCOUNTER — Telehealth: Payer: Self-pay | Admitting: Endocrinology

## 2015-09-03 NOTE — Telephone Encounter (Signed)
Please see below, not thyroid?

## 2015-09-03 NOTE — Telephone Encounter (Signed)
Noted, patient is aware. 

## 2015-09-03 NOTE — Telephone Encounter (Signed)
Pt has been vomiting with nausea, weak, and fatigue please advise  Pt thinks its thyroid

## 2015-09-03 NOTE — Telephone Encounter (Signed)
Not related to thyroid, needs to see PCP

## 2015-09-07 ENCOUNTER — Other Ambulatory Visit: Payer: 59

## 2015-09-08 ENCOUNTER — Other Ambulatory Visit: Payer: Self-pay | Admitting: Endocrinology

## 2015-09-10 ENCOUNTER — Ambulatory Visit (INDEPENDENT_AMBULATORY_CARE_PROVIDER_SITE_OTHER): Payer: 59 | Admitting: Endocrinology

## 2015-09-10 ENCOUNTER — Encounter: Payer: Self-pay | Admitting: Endocrinology

## 2015-09-10 VITALS — BP 118/86 | HR 72 | Temp 98.6°F | Resp 16 | Ht 64.0 in | Wt 142.2 lb

## 2015-09-10 DIAGNOSIS — H8110 Benign paroxysmal vertigo, unspecified ear: Secondary | ICD-10-CM | POA: Diagnosis not present

## 2015-09-10 DIAGNOSIS — E890 Postprocedural hypothyroidism: Secondary | ICD-10-CM

## 2015-09-10 DIAGNOSIS — R739 Hyperglycemia, unspecified: Secondary | ICD-10-CM

## 2015-09-10 DIAGNOSIS — T50905A Adverse effect of unspecified drugs, medicaments and biological substances, initial encounter: Secondary | ICD-10-CM

## 2015-09-10 LAB — BASIC METABOLIC PANEL
BUN: 17 mg/dL (ref 6–23)
CALCIUM: 10.3 mg/dL (ref 8.4–10.5)
CO2: 30 meq/L (ref 19–32)
Chloride: 101 mEq/L (ref 96–112)
Creatinine, Ser: 0.83 mg/dL (ref 0.40–1.20)
GFR: 93.98 mL/min (ref >60.00)
GLUCOSE: 104 mg/dL — AB (ref 70–99)
Potassium: 3.8 mEq/L (ref 3.5–5.1)
Sodium: 141 mEq/L (ref 135–145)

## 2015-09-10 LAB — HEMOGLOBIN A1C: Hgb A1c MFr Bld: 5.7 % (ref 4.6–6.5)

## 2015-09-10 MED ORDER — MECLIZINE HCL 12.5 MG PO TABS: ORAL_TABLET | ORAL | Status: DC

## 2015-09-10 NOTE — Patient Instructions (Signed)
Reduce the thyroid medication by half tablet once a week

## 2015-09-10 NOTE — Progress Notes (Signed)
Patient ID: [name], female   DOB: 26-Sep-1965, 50 y.o.   MRN: [id number]            Reason for Appointment:  Hypothyroidism, follow-up visit    History of Present Illness:   Background information: She was treated for hyperthyroidism in 05/2014; this was diagnosed when she was admitted to the hospital with nausea, weight loss of 20-25 pounds and constipation.  She was then found to have significant tachycardia and Hyperthyroidism  was diagnosed She got 15 mCi in 06/2014 and subsequently got hypothyroid, details of when she first went on thyroid supplements are unknown. She was initially treated with 75 g of levothyroxine Subsequently been TSH was 11 this was increased to 112 g which however brought her TSH down to 0.11 She was then started on 100 g of levothyroxine In 04/2015 she had an episode of syncope along with symptoms of nausea and was evaluated in an emergency room in [place] where she was found to have a TSH of 0.15 Subsequently had been on 50 g of levothyroxine which caused her to have significant fatigue, nausea and dizziness  RECENT history: Since her TSH was 25.7 her Synthroid was increased from 50 up to 88 g in  05/2015  On her last visit she was feeling fairly good and she continued to feel good until about a week ago She was having symptoms of dizziness, nausea, increased fatigue, fast heart rate and chest discomfort. She was seen by her PCP but no diagnosis made  She is very compliant with taking her medication in the morning before breakfast with juice Her weight is about the same TSH is borderline at 0.29  Patient's weight history is as follows:  Wt Readings from Last 3 Encounters:  09/10/15 142 lb 3.2 oz (64.501 kg)  06/11/15 144 lb 3.2 oz (65.409 kg)  05/14/15 143 lb 3.2 oz (64.955 kg)    Thyroid function results have been as follows:  Lab Results  Component Value Date   TSH 0.96 06/10/2015   TSH 25.67* 05/11/2015   TSH 0.11* 03/01/2015   FREET4 1.58 06/10/2015   FREET4 0.69 05/11/2015   FREET4 1.05 01/27/2015    Lab Results  Component Value Date   TSH 0.96 06/10/2015   TSH 25.67* 05/11/2015   TSH 0.11* 03/01/2015   TSH 11.04* 01/27/2015   TSH 25.71* 01/13/2015   TSH <0.010* 06/13/2014   TSH 0.011* 06/01/2014   TSH <0.010* 05/23/2014     Past Medical History  Diagnosis Date  . Hypertension   . Asthma   . Hyperthyroidism   . Enlarged ovary     right CT 05/2014  . 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 Abdominal hysterectomy    . Ovarian cyst surgery    . Esophagogastroduodenoscopy N/A 05/26/2014    Procedure: ESOPHAGOGASTRODUODENOSCOPY (EGD);  Surgeon: Joanna Binder, MD;  [facility];  Service: Endoscopy;  Laterality: N/A;  . Balloon dilation N/A 05/26/2014    Procedure: BALLOON DILATION OF THE PYLORUS;  Surgeon: Joanna Binder, MD;  [facility];  Service: Endoscopy;  Laterality: N/A;    Family History  Problem Relation Age of Onset  . Diabetes Mother   . Diabetes Father   . Hypertension Mother   . Hypertension Father   . Thyroid disease Mother   . Thyroid disease Sister   . Thyroid disease Brother     Social History:  reports that she has  never smoked. She does not have any smokeless tobacco history on file. She reports that she does not drink alcohol or use illicit drugs.  Allergies:  Allergies  Allergen Reactions  . Contrast Media [Iodinated Diagnostic Agents] Rash  . Iohexol Nausea And Vomiting    Pt. Vomited immediately after injection of Omni 350.    . Vicodin [Hydrocodone-Acetaminophen] Rash      Medication List       This list is accurate as of: 09/10/15 10:19 AM.  Always use your most recent med list.               hydrochlorothiazide 25 MG tablet  Commonly known as:  HYDRODIURIL  Take 0.5 tablets (12.5 mg total) by mouth daily.     levothyroxine 88 MCG tablet  Commonly known as:  SYNTHROID, LEVOTHROID  TAKE 1  TABLET (88 MCG TOTAL) BY MOUTH DAILY.     meclizine 12.5 MG tablet  Commonly known as:  ANTIVERT  Take 1-2 tablets 3 times a day as needed for dizziness     ondansetron 4 MG disintegrating tablet  Commonly known as:  ZOFRAN-ODT  Reported on 09/10/2015     potassium chloride SA 15 MEQ tablet  Commonly known as:  KLOR-CON M15  Take 15 mEq by mouth 2 (two) times daily. Reported on 06/11/2015        Review of Systems:  Review of Systems  Psychiatric/Behavioral: Positive for insomnia.   Over the last week she was having symptoms of swimming headedness, nausea and tiredness.  No fever or chills No chest discomfort recently             Examination:    BP 118/86 mmHg  Pulse 72  Temp(Src) 98.6 F (37 C)  Resp 16  Ht 5\' 4"  (1.626 m)  Wt 142 lb 3.2 oz (64.501 kg)  BMI 24.40 kg/m2  SpO2 98%   She looks well Thyroid not palpable Reflexes are normal bilaterally at biceps.    Assessment:  HYPOTHYROIDISM, secondary to radioactive iodine treatment Graves' disease in early 2016 She has been on variable doses of levothyroxine  Until about a week ago she was subjectively doing well Most likely she has an unrelated condition causing her recent symptoms TSH is minimally lowered 0.29  Probable benign positional vertigo  PLAN:   Continue the dose of Synthroid 88 g daily except reduce it by half tablet once a week  Trial of meclizine as needed for dizziness and she can follow-up with PCP if not better  Follow-up in 3 months with repeat labs    [facility] 09/10/2015, 10:19 AM

## 2015-09-12 NOTE — Progress Notes (Signed)
Quick Note:  Please let patient know that the diabetes test is normal although just borderline for prediabetes. No treatment needed, accept regular exercise and heart healthy diet. Will need to continue monitoring periodically ______

## 2015-11-13 ENCOUNTER — Other Ambulatory Visit: Payer: Self-pay | Admitting: Endocrinology

## 2015-11-29 ENCOUNTER — Other Ambulatory Visit: Payer: Self-pay | Admitting: Family Medicine

## 2015-11-29 DIAGNOSIS — Z1231 Encounter for screening mammogram for malignant neoplasm of breast: Secondary | ICD-10-CM

## 2015-12-07 ENCOUNTER — Other Ambulatory Visit: Payer: Commercial Managed Care - HMO

## 2015-12-10 ENCOUNTER — Other Ambulatory Visit: Payer: Self-pay | Admitting: Endocrinology

## 2015-12-10 ENCOUNTER — Ambulatory Visit: Payer: Commercial Managed Care - HMO | Admitting: Endocrinology

## 2015-12-14 ENCOUNTER — Ambulatory Visit
Admission: RE | Admit: 2015-12-14 | Discharge: 2015-12-14 | Disposition: A | Discharge status: Home / Self Care | Payer: Commercial Managed Care - HMO | Source: Ambulatory Visit | Attending: Family Medicine | Admitting: Family Medicine

## 2015-12-14 DIAGNOSIS — Z1231 Encounter for screening mammogram for malignant neoplasm of breast: Secondary | ICD-10-CM

## 2016-01-09 ENCOUNTER — Other Ambulatory Visit: Payer: Self-pay | Admitting: Endocrinology

## 2016-01-17 ENCOUNTER — Other Ambulatory Visit (INDEPENDENT_AMBULATORY_CARE_PROVIDER_SITE_OTHER): Payer: Commercial Managed Care - HMO

## 2016-01-17 DIAGNOSIS — E890 Postprocedural hypothyroidism: Secondary | ICD-10-CM | POA: Diagnosis not present

## 2016-01-17 LAB — T4, FREE: Free T4: 0.96 ng/dL (ref 0.60–1.60)

## 2016-01-17 LAB — TSH: TSH: 1.86 u[IU]/mL (ref 0.35–4.50)

## 2016-01-26 ENCOUNTER — Encounter: Payer: Self-pay | Admitting: Endocrinology

## 2016-01-26 ENCOUNTER — Ambulatory Visit (INDEPENDENT_AMBULATORY_CARE_PROVIDER_SITE_OTHER): Payer: Commercial Managed Care - HMO | Admitting: Endocrinology

## 2016-01-26 VITALS — BP 122/78 | HR 75 | Temp 97.7°F | Resp 14 | Ht 64.0 in | Wt 144.8 lb

## 2016-01-26 DIAGNOSIS — R7301 Impaired fasting glucose: Secondary | ICD-10-CM | POA: Diagnosis not present

## 2016-01-26 DIAGNOSIS — H8110 Benign paroxysmal vertigo, unspecified ear: Secondary | ICD-10-CM

## 2016-01-26 DIAGNOSIS — E890 Postprocedural hypothyroidism: Secondary | ICD-10-CM

## 2016-01-26 MED ORDER — MECLIZINE HCL 12.5 MG PO TABS: ORAL_TABLET | ORAL | 0 refills | Status: DC

## 2016-01-26 NOTE — Progress Notes (Signed)
Patient ID: [name], female   DOB: 1965/07/17, 50 y.o.   MRN: [id number]            Reason for Appointment:  Hypothyroidism, follow-up visit    History of Present Illness:   Background information: She was treated for hyperthyroidism in 05/2014; this was diagnosed when she was admitted to the hospital with nausea, weight loss of 20-25 pounds and constipation.  She was then found to have significant tachycardia and Hyperthyroidism  was diagnosed She got 15 mCi in 06/2014 and subsequently got hypothyroid, details of when she first went on thyroid supplements are unknown. She was initially treated with 75 g of levothyroxine Subsequently been TSH was 11 this was increased to 112 g which however brought her TSH down to 0.11 She was then started on 100 g of levothyroxine In 04/2015 she had an episode of syncope along with symptoms of nausea and was evaluated in an emergency room in [place] where she was found to have a TSH of 0.15 Subsequently had been on 50 g of levothyroxine which caused her to have significant fatigue, nausea and dizziness  RECENT history: Since her TSH was 25.7 her Synthroid was increased from 50 up to 88 g in  05/2015  Subsequently she has been feeling fairly good with her energy level. He again has periodic symptoms of dizziness  On her last visit she was was told to take a half tablet less per week since her TSH was 0.29 done from outside lab Does not feel any different since her last visit  She is very compliant with taking her medication in the morning before breakfast with juice Her weight is about the same TSH is back to normal  Patient's weight history is as follows:  Wt Readings from Last 3 Encounters:  01/26/16 144 lb 12.8 oz (65.7 kg)  09/10/15 142 lb 3.2 oz (64.5 kg)  06/11/15 144 lb 3.2 oz (65.4 kg)    Thyroid function results have been as follows:  Lab Results  Component Value Date   TSH 1.86 01/17/2016   TSH 0.96 06/10/2015   TSH  25.67 (H) 05/11/2015   FREET4 0.96 01/17/2016   FREET4 1.58 06/10/2015   FREET4 0.69 05/11/2015    Lab Results  Component Value Date   TSH 1.86 01/17/2016   TSH 0.96 06/10/2015   TSH 25.67 (H) 05/11/2015   TSH 0.11 (A) 03/01/2015   TSH 11.04 (H) 01/27/2015   TSH 25.71 (H) 01/13/2015   TSH <0.010 (L) 06/13/2014   TSH 0.011 (L) 06/01/2014   TSH <0.010 (L) 05/23/2014     Past Medical History:  Diagnosis Date  . Asthma   . Enlarged ovary    right CT 05/2014  . Hypertension   . Hyperthyroidism   . 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 ABDOMINAL HYSTERECTOMY    . BALLOON DILATION N/A 05/26/2014   Procedure: BALLOON DILATION OF THE PYLORUS;  Surgeon: Danie Binder, MD;  [facility];  Service: Endoscopy;  Laterality: N/A;  . ESOPHAGOGASTRODUODENOSCOPY N/A 05/26/2014   Procedure: ESOPHAGOGASTRODUODENOSCOPY (EGD);  Surgeon: Danie Binder, MD;  [facility];  Service: Endoscopy;  Laterality: N/A;  . OVARIAN CYST SURGERY      Family History  Problem Relation Age of Onset  . Diabetes Mother   . Diabetes Father   . Hypertension Mother   . Hypertension Father   . Thyroid disease Mother   . Thyroid  disease Sister   . Thyroid disease Brother     Social History:  reports that she has never smoked. She does not have any smokeless tobacco history on file. She reports that she does not drink alcohol or use drugs.  Allergies:  Allergies  Allergen Reactions  . Contrast Media [Iodinated Diagnostic Agents] Rash  . Iohexol Nausea And Vomiting    Pt. Vomited immediately after injection of Omni 350.    . Vicodin [Hydrocodone-Acetaminophen] Rash      Medication List       Accurate as of 01/26/16  9:03 AM. Always use your most recent med list.          hydrochlorothiazide 25 MG tablet Commonly known as:  HYDRODIURIL Take 0.5 tablets (12.5 mg total) by mouth daily.   levothyroxine 88 MCG tablet Commonly known  as:  SYNTHROID, LEVOTHROID TAKE 1 TABLET (88 MCG TOTAL) BY MOUTH DAILY.   meclizine 12.5 MG tablet Commonly known as:  ANTIVERT Take 1-2 tablets 3 times a day as needed for dizziness   potassium chloride SA 15 MEQ tablet Commonly known as:  KLOR-CON M15 Take 15 mEq by mouth 2 (two) times daily. Reported on 06/11/2015       Review of Systems:  Review of Systems  Psychiatric/Behavioral: Positive for insomnia.    Continues to be having symptoms of swimming headedness, nausea and this appears to be positional Did get better with meclizine but she has not had any further evaluation from PCP               Examination:    BP 122/78   Pulse 75   Temp 97.7 F (36.5 C)   Resp 14   Ht 5\' 4"  (1.626 m)   Wt 144 lb 12.8 oz (65.7 kg)   SpO2 97%   BMI 24.85 kg/m   She looks well   Assessment:  HYPOTHYROIDISM, secondary to radioactive iodine treatment Graves' disease in early 2016 She has been on variable doses of levothyroxine in the past  However her TSH levels are now fairly stable with 88 g of levothyroxine, currently taking 6-1/2 tablets a week She feels fairly well  Probable benign positional vertigo, she tends to get relief with meclizine but has recurrent symptoms  PLAN:   Continue the dose of Synthroid 88 g daily except half on Saturdays as before  Referral made for vestibular rehabilitation and refills given for meclizine at her request  Follow-up in 6 months with repeat labs    [facility] 01/26/2016, 9:03 AM

## 2016-01-28 ENCOUNTER — Ambulatory Visit: Payer: Commercial Managed Care - HMO | Attending: Endocrinology | Admitting: Physical Therapy

## 2016-01-28 VITALS — BP 120/85

## 2016-01-28 DIAGNOSIS — R42 Dizziness and giddiness: Secondary | ICD-10-CM | POA: Insufficient documentation

## 2016-01-28 NOTE — Therapy (Signed)
[facility] [address] [place], [place], [address] Phone: [phone]   Fax:  [phone]  Physical Therapy Evaluation  Patient Details  Name: [name] MRN: [id number] Date of Birth: 03/06/1966 Referring Provider: Dr. Elayne Snare  Encounter Date: 01/28/2016      PT End of Session - 01/28/16 1855    Visit Number 1   Number of Visits 1   Authorization Type [employer]   PT Start Time 0846   PT Stop Time 0930   PT Time Calculation (min) 44 min      Past Medical History:  Diagnosis Date  . Asthma   . Enlarged ovary    right CT 05/2014  . Hypertension   . Hyperthyroidism   . 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 ABDOMINAL HYSTERECTOMY    . BALLOON DILATION N/A 05/26/2014   Procedure: BALLOON DILATION OF THE PYLORUS;  Surgeon: Danie Binder, MD;  [facility];  Service: Endoscopy;  Laterality: N/A;  . ESOPHAGOGASTRODUODENOSCOPY N/A 05/26/2014   Procedure: ESOPHAGOGASTRODUODENOSCOPY (EGD);  Surgeon: Danie Binder, MD;  [facility];  Service: Endoscopy;  Laterality: N/A;  . OVARIAN CYST SURGERY      Vitals:   01/28/16 0906  BP: 120/85   BP 116/88 in standing - no signs of orthostatic hypotension noted      Subjective Assessment - 01/28/16 1833    Subjective Pt reports symptoms have been going on for a while but has gotten worse within past 6 months; feels like a veil is over eyes; feels fatigued and nauseaus at times; reports dizziness at work when she bends down and stands back up- but states it doesn't happen all the time   Pertinent History Hypothryoidism with hospitalization from end of Jan. until April 2016   Currently in Pain? No/denies            [facility] PT Assessment - 01/28/16 0859      Assessment   Medical Diagnosis BPPV   Referring Provider Dr. Elayne Snare   Onset Date/Surgical Date --  approx. 1 year ago, w/ worsening within  past 6 mos         Precautions   Precautions Other (comment)  Vertigo     Prior Function   Level of Independence Independent   Vocation Full time employment   Vocation Requirements pt works in Surveyor, mining at [employer]  requires bending, reaching and carrying shoe boxes            Vestibular Assessment - 01/28/16 0916      Vestibular Assessment   General Observation Pt is a 50 year old lady with c/o dizziness  which she describes as feeling as if there is a "veil" over her eyes and that her vision is not clear: she reports no room spinning vertigo; also says that it occurs intermittently ; pt denies vertigo with lying down or turning over in bed but does report feeling "light-headed" with bending over and returning to standing position     Symptom Behavior   Type of Dizziness Blurred vision   Frequency of Dizziness varies   Duration of Dizziness seconds to minutes   Aggravating Factors Forward bending;Comment  Return to upright standing position   Relieving Factors Closing eyes     Occulomotor Exam   Occulomotor Alignment Normal   Smooth Pursuits Intact     Visual Acuity   Static Line 10  Dynamic Line 9     Positional Testing   Dix-Hallpike Dix-Hallpike Right;Dix-Hallpike Left   Sidelying Test Sidelying Right;Sidelying Left     Dix-Hallpike Right   Dix-Hallpike Right Duration none   Dix-Hallpike Right Symptoms No nystagmus     Dix-Hallpike Left   Dix-Hallpike Left Duration none   Dix-Hallpike Left Symptoms No nystagmus     Sidelying Right   Sidelying Right Duration none   Sidelying Right Symptoms No nystagmus     Sidelying Left   Sidelying Left Duration none   Sidelying Left Symptoms No nystagmus     Positional Sensitivities   Up from Right Hallpike Lightheadedness   Up from Left Hallpike Lightheadedness   Head Turning x 5 No dizziness      Pt able to maintain balance with static standing with EC for 10 secs; able to perform SLS >10 secs and able to  perform  tandem stance for 30 secs with no LOB  Pt denies having difficulty maintaining balance in dark or with EC               PT Education - 01/28/16 1852    Education provided Yes   Education Details results of eval with these findings being inconsistent with BPPV; educated pt in etiology of BPPV and explained how no nystagmus was observed during positional testing today, indicating etiology of vertigo is not appearing to be due to diagnosis of BPPV, but rather possibly due to other medical issues    Person(s) Educated Patient   Methods Explanation   Comprehension Verbalized understanding                    Plan - 01/28/16 1856    Clinical Impression Statement Pt is a 50 year old lady with c/o dizziness which has been occurring for past year, but she reports progressive worsening within past 6 months.  No signs/symptoms consistent with BPPV (referring diagnosis) as no nystagmus observed in room light and no c/o room-spinning vertigo reported with any positional testing.  Pt did report light-headedness and blurred/unclear vision with return to upright position; no significant drop in BP recorded from seated position to standing position and pt did not report any light-headedness with sit to stand transfer.  Pt describes a feeling as if a "veil" is over her eyes and states her vision is not clear, in spite of having eye exams regularly.  PMH includes hypothyroidism with Hospitalization Jan.- April 2016 and Graves' disease.  Pt would benefit from further diagnostic workup to determine etiology of dizziness as no signs of vestibular system dysfunction noted at this time.   PT Frequency 1x / week  Eval only   PT Treatment/Interventions Patient/family education;Other (comment)  Initial eval   PT Next Visit Plan N/A   Recommended Other Services Recommend pt to follow up with MD for further testing for cause of dizziness - as no signs/symptoms noted at today's eval which are  indicative of BPPV   Consulted and Agree with Plan of Care Patient      Patient will benefit from skilled therapeutic intervention in order to improve the following deficits and impairments:     Visit Diagnosis: Dizziness and giddiness - Plan: PT plan of care cert/re-cert     Problem List Patient Active Problem List   Diagnosis Date Noted  . 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 Muscle cramp 01/13/2015  . Hyperglycemia, drug-induced 06/10/2014  . Anemia due to other cause 06/10/2014  . Hypokalemia 06/10/2014  .  Malnutrition of moderate degree ([place]) 06/02/2014  . Elevated d-dimer   . Altered mental status 06/01/2014  . Chest pain 06/01/2014  . Generalized weakness   . Enlarged ovary 05/28/2014  . Weight loss   . UTI (urinary tract infection) 05/26/2014  . Abdominal pain   . Fever   . Nausea with vomiting   . Nausea & vomiting 05/23/2014  . Pancreatitis 05/23/2014  . Essential hypertension 05/23/2014  . Rapid weight loss 05/23/2014  . Tachycardia 05/23/2014  . Dehydration 05/23/2014  . GERD (gastroesophageal reflux disease) 05/23/2014  . Generalized abdominal pain     Quy Lotts, [place], PT 01/28/2016, 7:13 PM  [facility] [address] [place], [place], [address] Phone: [phone]   Fax:  [phone]  Name: [name] MRN: [id number] Date of Birth: 09-30-1965

## 2016-01-31 ENCOUNTER — Other Ambulatory Visit: Payer: Self-pay | Admitting: *Deleted

## 2016-01-31 MED ORDER — LEVOTHYROXINE SODIUM 88 MCG PO TABS: ORAL_TABLET | ORAL | 1 refills | Status: DC

## 2016-04-17 DIAGNOSIS — M9903 Segmental and somatic dysfunction of lumbar region: Secondary | ICD-10-CM | POA: Diagnosis not present

## 2016-04-17 DIAGNOSIS — M9905 Segmental and somatic dysfunction of pelvic region: Secondary | ICD-10-CM | POA: Diagnosis not present

## 2016-04-17 DIAGNOSIS — M5441 Lumbago with sciatica, right side: Secondary | ICD-10-CM | POA: Diagnosis not present

## 2016-05-18 DIAGNOSIS — M5416 Radiculopathy, lumbar region: Secondary | ICD-10-CM | POA: Diagnosis not present

## 2016-05-24 DIAGNOSIS — M5136 Other intervertebral disc degeneration, lumbar region: Secondary | ICD-10-CM | POA: Diagnosis not present

## 2016-05-24 DIAGNOSIS — M5416 Radiculopathy, lumbar region: Secondary | ICD-10-CM | POA: Diagnosis not present

## 2016-05-31 DIAGNOSIS — M5416 Radiculopathy, lumbar region: Secondary | ICD-10-CM | POA: Diagnosis not present

## 2016-06-12 DIAGNOSIS — M9902 Segmental and somatic dysfunction of thoracic region: Secondary | ICD-10-CM | POA: Diagnosis not present

## 2016-06-12 DIAGNOSIS — M9903 Segmental and somatic dysfunction of lumbar region: Secondary | ICD-10-CM | POA: Diagnosis not present

## 2016-06-12 DIAGNOSIS — M5441 Lumbago with sciatica, right side: Secondary | ICD-10-CM | POA: Diagnosis not present

## 2016-07-21 ENCOUNTER — Other Ambulatory Visit (INDEPENDENT_AMBULATORY_CARE_PROVIDER_SITE_OTHER): Payer: Commercial Managed Care - HMO

## 2016-07-21 DIAGNOSIS — E890 Postprocedural hypothyroidism: Secondary | ICD-10-CM

## 2016-07-21 DIAGNOSIS — R7301 Impaired fasting glucose: Secondary | ICD-10-CM | POA: Diagnosis not present

## 2016-07-21 LAB — TSH: TSH: 0.42 u[IU]/mL (ref 0.35–4.50)

## 2016-07-21 LAB — T4, FREE: Free T4: 1.35 ng/dL (ref 0.60–1.60)

## 2016-07-21 LAB — GLUCOSE, RANDOM: Glucose, Bld: 103 mg/dL — ABNORMAL HIGH (ref 70–99)

## 2016-07-26 ENCOUNTER — Encounter: Payer: Self-pay | Admitting: Endocrinology

## 2016-07-26 ENCOUNTER — Ambulatory Visit (INDEPENDENT_AMBULATORY_CARE_PROVIDER_SITE_OTHER): Payer: Commercial Managed Care - HMO | Admitting: Endocrinology

## 2016-07-26 VITALS — BP 128/86 | HR 73 | Ht 64.0 in | Wt 150.0 lb

## 2016-07-26 DIAGNOSIS — E890 Postprocedural hypothyroidism: Secondary | ICD-10-CM

## 2016-07-26 NOTE — Patient Instructions (Signed)
Change to 75ug next Rx

## 2016-07-26 NOTE — Progress Notes (Signed)
Patient ID: [name], female   DOB: 06-Feb-1966, 51 y.o.   MRN: [id number]            Reason for Appointment:  Hypothyroidism, follow-up visit    History of Present Illness:   Background information: She was treated for hyperthyroidism in 05/2014; this was diagnosed when she was admitted to the hospital with nausea, weight loss of 20-25 pounds and constipation.  She was then found to have significant tachycardia and Hyperthyroidism  was diagnosed She got 15 mCi in 06/2014 and subsequently got hypothyroid, details of when she first went on thyroid supplements are unknown. She was initially treated with 75 g of levothyroxine Subsequently been TSH was 11 this was increased to 112 g which however brought her TSH down to 0.11 She was then started on 100 g of levothyroxine In 04/2015 she had an episode of syncope along with symptoms of nausea and was evaluated in an emergency room in [place] where she was found to have a TSH of 0.15 Subsequently had been on 50 g of levothyroxine which caused her to have significant fatigue, nausea and dizziness  RECENT history: Since her TSH was 25.7 her Synthroid was increased from 50 up to 88 g in  05/2015 Subsequently when her TSH was low normal she was told to take a half tablet less per week  Does not feel any different since her last visit, usually has good energy level However has gained weight from lack of exercise and not inconsistent with diet  She is very compliant with taking her medication in the morning before breakfast with juice Her weight is about the same Still getting generic medication and not clear if she is getting the same manufacturer  TSH is relatively lower than usual  Patient's weight history is as follows:  Wt Readings from Last 3 Encounters:  07/26/16 150 lb (68 kg)  01/26/16 144 lb 12.8 oz (65.7 kg)  09/10/15 142 lb 3.2 oz (64.5 kg)    Thyroid function results have been as follows:  Lab Results  Component Value  Date   TSH 0.42 07/21/2016   TSH 1.86 01/17/2016   TSH 0.96 06/10/2015   FREET4 1.35 07/21/2016   FREET4 0.96 01/17/2016   FREET4 1.58 06/10/2015    Lab Results  Component Value Date   TSH 0.42 07/21/2016   TSH 1.86 01/17/2016   TSH 0.96 06/10/2015   TSH 25.67 (H) 05/11/2015   TSH 0.11 (A) 03/01/2015   TSH 11.04 (H) 01/27/2015   TSH 25.71 (H) 01/13/2015   TSH <0.010 (L) 06/13/2014   TSH 0.011 (L) 06/01/2014     Past Medical History:  Diagnosis Date  . Asthma   . Enlarged ovary    right CT 05/2014  . Hypertension   . Hyperthyroidism   . 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 ABDOMINAL HYSTERECTOMY    . BALLOON DILATION N/A 05/26/2014   Procedure: BALLOON DILATION OF THE PYLORUS;  Surgeon: Danie Binder, MD;  [facility];  Service: Endoscopy;  Laterality: N/A;  . ESOPHAGOGASTRODUODENOSCOPY N/A 05/26/2014   Procedure: ESOPHAGOGASTRODUODENOSCOPY (EGD);  Surgeon: Danie Binder, MD;  Location: [facility];  Service: Endoscopy;  Laterality: N/A;  . OVARIAN CYST SURGERY      Family History  Problem Relation Age of Onset  . Diabetes Mother   . Diabetes Father   . Hypertension Mother   . Hypertension Father   . Thyroid disease Mother   .  Thyroid disease Sister   . Thyroid disease Brother     Social History:  reports that she has never smoked. She has never used smokeless tobacco. She reports that she does not drink alcohol or use drugs.  Allergies:  Allergies  Allergen Reactions  . Contrast Media [Iodinated Diagnostic Agents] Rash  . Iohexol Nausea And Vomiting    Pt. Vomited immediately after injection of Omni 350.    . Vicodin [Hydrocodone-Acetaminophen] Rash    Allergies as of 07/26/2016      Reactions   Contrast Media [iodinated Diagnostic Agents] Rash   Iohexol Nausea And Vomiting   Pt. Vomited immediately after injection of Omni 350.     Vicodin [hydrocodone-acetaminophen] Rash      Medication  List       Accurate as of 07/26/16  1:03 PM. Always use your most recent med list.          hydrochlorothiazide 25 MG tablet Commonly known as:  HYDRODIURIL Take 0.5 tablets (12.5 mg total) by mouth daily.   levothyroxine 88 MCG tablet Commonly known as:  SYNTHROID, LEVOTHROID TAKE 1 TABLET (88 MCG TOTAL) BY MOUTH DAILY.   meclizine 12.5 MG tablet Commonly known as:  ANTIVERT Take 1-2 tablets 3 times a day as needed for dizziness   potassium chloride SA 15 MEQ tablet Commonly known as:  KLOR-CON M15 Take 15 mEq by mouth 2 (two) times daily. Reported on 06/11/2015       Review of Systems:  Review of Systems  Psychiatric/Behavioral: Positive for insomnia.   Although she was having postural vertigo and nausea she is not having any symptoms She thinks this is from increasing her fluid intake               Examination:    BP 128/86   Pulse 73   Ht 5\' 4"  (1.626 m)   Wt 150 lb (68 kg)   BMI 25.75 kg/m   She looks well   Assessment:  HYPOTHYROIDISM, secondary to radioactive iodine treatment Graves' disease in early 2016 She has been on variable doses of levothyroxine in the past She is doing subjectively fairly well with her energy level although has gained weight TSH levels are generally fairly stable with 88 g of levothyroxine, currently taking 6-1/2 tablets a week However TSH is now low normal, her average dose is about 81 g daily  Hypertension: She will follow-up with PCP  PLAN:   Continue the dose of Synthroid 88 g 6-1/2 tablets a week but on her next prescription she will be switched to 75 g because of her low normal TSH   Follow-up in 6 months with repeat labs    [facility] 07/26/2016, 1:03 PM

## 2016-07-27 ENCOUNTER — Emergency Department (HOSPITAL_COMMUNITY)
Admission: EM | Admit: 2016-07-27 | Discharge: 2016-07-27 | Disposition: A | Discharge status: Home / Self Care | Payer: Commercial Managed Care - HMO | Attending: Emergency Medicine | Admitting: Emergency Medicine

## 2016-07-27 ENCOUNTER — Encounter (HOSPITAL_COMMUNITY): Payer: Self-pay | Admitting: Emergency Medicine

## 2016-07-27 ENCOUNTER — Emergency Department (HOSPITAL_COMMUNITY): Payer: Commercial Managed Care - HMO

## 2016-07-27 DIAGNOSIS — Z5181 Encounter for therapeutic drug level monitoring: Secondary | ICD-10-CM | POA: Insufficient documentation

## 2016-07-27 DIAGNOSIS — G40909 Epilepsy, unspecified, not intractable, without status epilepticus: Secondary | ICD-10-CM | POA: Insufficient documentation

## 2016-07-27 DIAGNOSIS — E039 Hypothyroidism, unspecified: Secondary | ICD-10-CM | POA: Insufficient documentation

## 2016-07-27 DIAGNOSIS — R202 Paresthesia of skin: Secondary | ICD-10-CM | POA: Diagnosis not present

## 2016-07-27 DIAGNOSIS — I10 Essential (primary) hypertension: Secondary | ICD-10-CM | POA: Insufficient documentation

## 2016-07-27 DIAGNOSIS — R404 Transient alteration of awareness: Secondary | ICD-10-CM | POA: Diagnosis not present

## 2016-07-27 DIAGNOSIS — Z79899 Other long term (current) drug therapy: Secondary | ICD-10-CM | POA: Insufficient documentation

## 2016-07-27 DIAGNOSIS — J45909 Unspecified asthma, uncomplicated: Secondary | ICD-10-CM | POA: Diagnosis not present

## 2016-07-27 DIAGNOSIS — R569 Unspecified convulsions: Secondary | ICD-10-CM | POA: Diagnosis not present

## 2016-07-27 HISTORY — DX: Unspecified convulsions: R56.9

## 2016-07-27 HISTORY — DX: Dizziness and giddiness: R42

## 2016-07-27 LAB — COMPREHENSIVE METABOLIC PANEL
ALT: 15 U/L (ref 14–54)
ANION GAP: 11 (ref 5–15)
AST: 23 U/L (ref 15–41)
Albumin: 4.9 g/dL (ref 3.5–5.0)
Alkaline Phosphatase: 96 U/L (ref 38–126)
BILIRUBIN TOTAL: 0.6 mg/dL (ref 0.3–1.2)
BUN: 17 mg/dL (ref 6–20)
CALCIUM: 10.2 mg/dL (ref 8.9–10.3)
CO2: 27 mmol/L (ref 22–32)
Chloride: 102 mmol/L (ref 101–111)
Creatinine, Ser: 0.77 mg/dL (ref 0.44–1.00)
GFR calc Af Amer: >60 mL/min (ref >60)
Glucose, Bld: 113 mg/dL — ABNORMAL HIGH (ref 65–99)
POTASSIUM: 3 mmol/L — AB (ref 3.5–5.1)
Sodium: 140 mmol/L (ref 135–145)
Total Protein: 8.5 g/dL — ABNORMAL HIGH (ref 6.5–8.1)

## 2016-07-27 LAB — RAPID URINE DRUG SCREEN, HOSP PERFORMED
Amphetamines: NOT DETECTED
BENZODIAZEPINES: NOT DETECTED
Barbiturates: NOT DETECTED
COCAINE: NOT DETECTED
OPIATES: NOT DETECTED
Tetrahydrocannabinol: NOT DETECTED

## 2016-07-27 LAB — DIFFERENTIAL
Basophils Absolute: 0 10*3/uL (ref 0.0–0.1)
Basophils Relative: 0 %
EOS ABS: 0 10*3/uL (ref 0.0–0.7)
EOS PCT: 0 %
LYMPHS ABS: 1.6 10*3/uL (ref 0.7–4.0)
Lymphocytes Relative: 24 %
MONOS PCT: 9 %
Monocytes Absolute: 0.6 10*3/uL (ref 0.1–1.0)
NEUTROS PCT: 66 %
Neutro Abs: 4.4 10*3/uL (ref 1.7–7.7)

## 2016-07-27 LAB — URINALYSIS, ROUTINE W REFLEX MICROSCOPIC
BILIRUBIN URINE: NEGATIVE
Glucose, UA: NEGATIVE mg/dL
HGB URINE DIPSTICK: NEGATIVE
Ketones, ur: NEGATIVE mg/dL
Leukocytes, UA: NEGATIVE
Nitrite: NEGATIVE
PH: 8 (ref 5.0–8.0)
Protein, ur: NEGATIVE mg/dL
SPECIFIC GRAVITY, URINE: 1.009 (ref 1.005–1.030)

## 2016-07-27 LAB — TROPONIN I: Troponin I: <0.03 ng/mL (ref <0.03)

## 2016-07-27 LAB — CBC
HEMATOCRIT: 43.8 % (ref 36.0–46.0)
HEMOGLOBIN: 14.6 g/dL (ref 12.0–15.0)
MCH: 29.6 pg (ref 26.0–34.0)
MCHC: 33.3 g/dL (ref 30.0–36.0)
MCV: 88.7 fL (ref 78.0–100.0)
Platelets: 221 10*3/uL (ref 150–400)
RBC: 4.94 MIL/uL (ref 3.87–5.11)
RDW: 12.8 % (ref 11.5–15.5)
WBC: 6.6 10*3/uL (ref 4.0–10.5)

## 2016-07-27 LAB — CBG MONITORING, ED: GLUCOSE-CAPILLARY: 174 mg/dL — AB (ref 65–99)

## 2016-07-27 LAB — PROTIME-INR
INR: 0.94
Prothrombin Time: 12.6 seconds (ref 11.4–15.2)

## 2016-07-27 LAB — PREGNANCY, URINE: Preg Test, Ur: NEGATIVE

## 2016-07-27 LAB — APTT: APTT: 31 s (ref 24–36)

## 2016-07-27 LAB — ETHANOL

## 2016-07-27 MED ORDER — POTASSIUM CHLORIDE CRYS ER 20 MEQ PO TBCR
40.0000 meq | EXTENDED_RELEASE_TABLET | Freq: Once | ORAL | Status: AC
  Administered 2016-07-27: 40 meq via ORAL
  Filled 2016-07-27: qty 2

## 2016-07-27 MED ORDER — LEVETIRACETAM 250 MG PO TABS: 250.0000 mg | ORAL_TABLET | Freq: Two times a day (BID) | ORAL | 0 refills | Status: DC

## 2016-07-27 MED ORDER — LEVETIRACETAM 250 MG PO TABS
250.0000 mg | ORAL_TABLET | Freq: Once | ORAL | Status: AC
  Administered 2016-07-27: 250 mg via ORAL
  Filled 2016-07-27: qty 1

## 2016-07-27 NOTE — ED Provider Notes (Signed)
[facility] DEPT Provider Note   CSN: [id number] Arrival date & time: 07/27/16  1037     History   Chief Complaint Chief Complaint  Patient presents with  . Seizures    HPI [name] is a 51 y.o. female.  The history is provided by the patient and the EMS personnel. The history is limited by the condition of the patient (confusion).  Seizures     Pt was seen at 1125. 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 Pt states the left side of her face, tongue, LUE and LLE became "numb/tingling" and "wouldn't move." Pt then does not recall events. Witnesses at scene state pt had a seizure. EMS states pt had 2nd seizure en route that lasted approximately 25 seconds. Pt then awoke confused. Pt told EMS that she had hx of seizures but "hadn't had one in a long time." Pt states she feels "confused" and her left side continues "numb and tingling." Pt denies CP/SOB, no abd pain, no N/V/D, no fevers, no neck pain, no back pain.   Past Medical History:  Diagnosis Date  . Asthma   . Enlarged ovary    right CT 05/2014  . Hypertension   . Hyperthyroidism   . Seizures ([place])   . 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 Muscle cramp 01/13/2015  . Hyperglycemia, drug-induced 06/10/2014  . Anemia due to other cause 06/10/2014  . Hypokalemia 06/10/2014  . Malnutrition of moderate degree ([place]) 06/02/2014  . Elevated d-dimer   . Altered mental status 06/01/2014  . Chest pain 06/01/2014  . Generalized weakness   . Enlarged ovary 05/28/2014  . Weight loss   . UTI (urinary tract infection) 05/26/2014  . Abdominal pain   . Fever   . Nausea with vomiting   . Nausea & vomiting 05/23/2014  . Pancreatitis 05/23/2014  . Essential hypertension 05/23/2014  . Rapid weight loss 05/23/2014   . Tachycardia 05/23/2014  . Dehydration 05/23/2014  . GERD (gastroesophageal reflux disease) 05/23/2014  . Generalized abdominal pain     Past Surgical History:  Procedure Laterality Date  . ABDOMINAL HYSTERECTOMY    . BALLOON DILATION N/A 05/26/2014   Procedure: BALLOON DILATION OF THE PYLORUS;  Surgeon: Danie Binder, MD;  [facility];  Service: Endoscopy;  Laterality: N/A;  . ESOPHAGOGASTRODUODENOSCOPY N/A 05/26/2014   Procedure: ESOPHAGOGASTRODUODENOSCOPY (EGD);  Surgeon: Danie Binder, MD;  Location: [facility];  Service: Endoscopy;  Laterality: N/A;  . OVARIAN CYST SURGERY      OB History    No data available       Home Medications    Prior to Admission medications   Medication Sig Start Date End Date Taking? Authorizing Provider  hydrochlorothiazide (HYDRODIURIL) 25 MG tablet Take 0.5 tablets (12.5 mg total) by mouth daily. Patient taking differently: Take 25 mg by mouth daily.  06/15/14  Yes Rexene Alberts, MD  levothyroxine (SYNTHROID, LEVOTHROID) 88 MCG tablet TAKE 1 TABLET (88 MCG TOTAL) BY MOUTH DAILY. 01/31/16  Yes Elayne Snare, MD  meclizine (ANTIVERT) 12.5 MG tablet Take 1-2 tablets 3 times a day as needed for dizziness Patient not taking: Reported on 07/26/2016 01/26/16   Elayne Snare, MD  potassium chloride SA (KLOR-CON M15) 15 MEQ tablet Take 15 mEq by mouth 2 (  two) times daily. Reported on 06/11/2015    Historical Provider, MD    Family History Family History  Problem Relation Age of Onset  . Diabetes Mother   . Hypertension Mother   . Thyroid disease Mother   . Diabetes Father   . Hypertension Father   . Thyroid disease Sister   . Thyroid disease Brother     Social History Social History  Substance Use Topics  . Smoking status: Never Smoker  . Smokeless tobacco: Never Used  . Alcohol use No     Allergies   Contrast media [iodinated diagnostic agents]; Iohexol; and Vicodin [hydrocodone-acetaminophen]   Review of Systems Review of  Systems  Unable to perform ROS: Mental status change     Physical Exam Updated Vital Signs BP (!) 141/115 (BP Location: Left Arm)   Pulse 89   Temp 98.3 F (36.8 C) (Oral)   Resp (!) 24   Ht 5\' 4"  (1.626 m)   Wt 150 lb (68 kg)   SpO2 100%   BMI 25.75 kg/m   Physical Exam 1130: Physical examination:  Nursing notes reviewed; Vital signs and O2 SAT reviewed;  Constitutional: Well developed, Well nourished, Well hydrated, In no acute distress; Head:  Normocephalic, atraumatic; Eyes: EOMI, PERRL, No scleral icterus; ENMT: Mouth and pharynx normal, Mucous membranes moist; Neck: Supple, Full range of motion, No lymphadenopathy; Cardiovascular: Regular rate and rhythm, No gallop; Respiratory: Breath sounds clear & equal bilaterally, No wheezes.  Speaking full sentences with ease, Normal respiratory effort/excursion; Chest: Nontender, Movement normal; Abdomen: Soft, Nontender, Nondistended, Normal bowel sounds; Genitourinary: No CVA tenderness; Extremities: Pulses normal, No tenderness, No edema, No calf edema or asymmetry.; Neuro: Awake, alert, confused re: events. No facial droop. Major CN grossly intact.  Speech clear. Grips equal. 3-4/5 strength bilat UE's and LE's. +subjective left face and arm decreased sensation. No gross focal motor deficits in extremities. Ataxic bilat UE's and LE's on cerebellar testing.; Skin: Color normal, Warm, Dry.   ED Treatments / Results  Labs (all labs ordered are listed, but only abnormal results are displayed)   EKG  EKG Interpretation  Date/Time:  Thursday July 27 2016 10:46:51 EDT Ventricular Rate:  89 PR Interval:    QRS Duration: 91 QT Interval:  365 QTC Calculation: 445 R Axis:   60 Text Interpretation:  Sinus rhythm Nonspecific ST and T wave abnormality Inferior leads When compared with ECG of 06/09/2014 Nonspecific ST and T wave abnormality is now Present Rate slower Confirmed by [facility]  MD, Nunzio Cory [phone]) on 07/27/2016 12:03:34 PM        Radiology   Procedures Procedures (including critical care time)  Medications Ordered in ED Medications - No data to display   Initial Impression / Assessment and Plan / ED Course  I have reviewed the triage vital signs and the nursing notes.  Pertinent labs & imaging results that were available during my care of the patient were reviewed by me and considered in my medical decision making (see chart for details).  MDM Reviewed: previous chart, nursing note and vitals Reviewed previous: labs and ECG Interpretation: labs, ECG, x-ray, CT scan and MRI    Results for orders placed or performed during the hospital encounter of 07/27/16  Ethanol  Result Value Ref Range   Alcohol, Ethyl (B) <5 <5 mg/dL  Protime-INR  Result Value Ref Range   Prothrombin Time 12.6 11.4 - 15.2 seconds   INR 0.94   APTT  Result Value Ref Range   aPTT  31 24 - 36 seconds  CBC  Result Value Ref Range   WBC 6.6 4.0 - 10.5 K/uL   RBC 4.94 3.87 - 5.11 MIL/uL   Hemoglobin 14.6 12.0 - 15.0 g/dL   HCT 43.8 36.0 - 46.0 %   MCV 88.7 78.0 - 100.0 fL   MCH 29.6 26.0 - 34.0 pg   MCHC 33.3 30.0 - 36.0 g/dL   RDW 12.8 11.5 - 15.5 %   Platelets 221 150 - 400 K/uL  Differential  Result Value Ref Range   Neutrophils Relative % 66 %   Neutro Abs 4.4 1.7 - 7.7 K/uL   Lymphocytes Relative 24 %   Lymphs Abs 1.6 0.7 - 4.0 K/uL   Monocytes Relative 9 %   Monocytes Absolute 0.6 0.1 - 1.0 K/uL   Eosinophils Relative 0 %   Eosinophils Absolute 0.0 0.0 - 0.7 K/uL   Basophils Relative 0 %   Basophils Absolute 0.0 0.0 - 0.1 K/uL  Comprehensive metabolic panel  Result Value Ref Range   Sodium 140 135 - 145 mmol/L   Potassium 3.0 (L) 3.5 - 5.1 mmol/L   Chloride 102 101 - 111 mmol/L   CO2 27 22 - 32 mmol/L   Glucose, Bld 113 (H) 65 - 99 mg/dL   BUN 17 6 - 20 mg/dL   Creatinine, Ser 0.77 0.44 - 1.00 mg/dL   Calcium 10.2 8.9 - 10.3 mg/dL   Total Protein 8.5 (H) 6.5 - 8.1 g/dL   Albumin 4.9 3.5 - 5.0 g/dL    AST 23 15 - 41 U/L   ALT 15 14 - 54 U/L   Alkaline Phosphatase 96 38 - 126 U/L   Total Bilirubin 0.6 0.3 - 1.2 mg/dL   GFR calc non Af Amer >60 >60 mL/min   GFR calc Af Amer >60 >60 mL/min   Anion gap 11 5 - 15  Urine rapid drug screen (hosp performed)not at [facility]  Result Value Ref Range   Opiates NONE DETECTED NONE DETECTED   Cocaine NONE DETECTED NONE DETECTED   Benzodiazepines NONE DETECTED NONE DETECTED   Amphetamines NONE DETECTED NONE DETECTED   Tetrahydrocannabinol NONE DETECTED NONE DETECTED   Barbiturates NONE DETECTED NONE DETECTED  Urinalysis, Routine w reflex microscopic  Result Value Ref Range   Color, Urine STRAW (A) YELLOW   APPearance CLEAR CLEAR   Specific Gravity, Urine 1.009 1.005 - 1.030   pH 8.0 5.0 - 8.0   Glucose, UA NEGATIVE NEGATIVE mg/dL   Hgb urine dipstick NEGATIVE NEGATIVE   Bilirubin Urine NEGATIVE NEGATIVE   Ketones, ur NEGATIVE NEGATIVE mg/dL   Protein, ur NEGATIVE NEGATIVE mg/dL   Nitrite NEGATIVE NEGATIVE   Leukocytes, UA NEGATIVE NEGATIVE  Troponin I  Result Value Ref Range   Troponin I <0.03 <0.03 ng/mL  Pregnancy, urine  Result Value Ref Range   Preg Test, Ur NEGATIVE NEGATIVE  CBG monitoring, ED  Result Value Ref Range   Glucose-Capillary 174 (H) 65 - 99 mg/dL   Ct Head Wo Contrast Result Date: 07/27/2016 CLINICAL DATA:  Two seizures today.  History of seizures. EXAM: CT HEAD WITHOUT CONTRAST TECHNIQUE: Contiguous axial images were obtained from the base of the skull through the vertex without intravenous contrast. COMPARISON:  06/01/2014. FINDINGS: Brain: No evidence of acute infarction, hemorrhage, hydrocephalus, extra-axial collection or mass lesion/mass effect. Vascular: No hyperdense vessel or unexpected calcification. Skull: Normal. Negative for fracture or focal lesion. Sinuses/Orbits: Unremarkable. Other: None. IMPRESSION: Normal examination. Electronically Signed   By: Remo Lipps  Joneen Caraway M.D.   On: 07/27/2016 12:10   Mr Brain Wo  Contrast (neuro Protocol) Result Date: 07/27/2016 CLINICAL DATA:  51 year old female with acute seizures. Initial encounter. EXAM: MRI HEAD WITHOUT CONTRAST TECHNIQUE: Multiplanar, multiecho pulse sequences of the brain and surrounding structures were obtained without intravenous contrast. COMPARISON:  Head CT without contrast 1154 hours today. [facility] Brain MRI 07/18/2011. FINDINGS: Brain: Cerebral volume is stable and within normal limits. Chronic partially empty sella is stable. No restricted diffusion to suggest acute infarction. No midline shift, mass effect, evidence of mass lesion, ventriculomegaly, extra-axial collection or acute intracranial hemorrhage. Cervicomedullary junction within normal limits. Hippocampal formations appear symmetric and normal. Other mesial temporal lobe structures appear within normal limits. Elsewhere gray and white matter signal is stable and within normal limits. No encephalomalacia or chronic cerebral blood products identified. Vascular: Major intracranial vascular flow voids are stable since 2013. Skull and upper cervical spine: Negative. Normal bone marrow signal. Sinuses/Orbits: Orbits soft tissues are within normal limits. Visualized paranasal sinuses and mastoids are stable and well pneumatized. Other: Visible internal auditory structures appear normal. Negative scalp soft tissues. IMPRESSION: No acute intracranial abnormality. Stable since 2013 and negative noncontrast MRI appearance of the brain. Electronically Signed   By: Genevie Ann M.D.   On: 07/27/2016 14:31    1140: Concerned regarding left sided weakness but pt did have witnessed seizure by EMS. Pt's weakness has improved, but states her left side still "feels numb and tingling."  T/C to [facility] Neuro Hospitalist Dr. Shon Hale, case discussed, including:  HPI, pertinent PM/SHx, VS/PE, dx testing, ED course and treatment:  Not code stroke, left sided symptoms likely aura and related to seizure, work pt up for  seizure.   1300:  Given persistent symptoms, will obtain MRI brain.   1445:  Friend at bedside feels pt is back to baseline. Pt does continue to c/o left sided "numbness" but MRI negative for acute stroke. Will replete potassium PO. T/C to Neuro Dr. Merlene Laughter, case discussed, including:  HPI, pertinent PM/SHx, VS/PE, dx testing, ED course and treatment:  Agrees with ED workup, given previous hx of seizures, requests to rx keppra 250mg  BID, f/u office next week.  Will give 1st dose keppra here. Dx and testing, as well as d/w Neuro MD, d/w pt and family.  Questions answered.  Verb understanding, agreeable with plan.   Final Clinical Impressions(s) / ED Diagnoses   Final diagnoses:  None    New Prescriptions New Prescriptions   No medications on file      Francine Graven, DO 07/30/16 1639

## 2016-07-27 NOTE — Discharge Instructions (Signed)
Take the prescription as directed.  Call the Neurologist today to schedule a follow up appointment within the next week.  Return to the Emergency Department immediately sooner if worsening.

## 2016-07-27 NOTE — ED Notes (Signed)
Patient given discharge instruction, verbalized understand. IV removed, band aid applied. Patient  Wheelchair out of the department.  

## 2016-07-27 NOTE — ED Notes (Signed)
Seizure pads on pt bed at this time. EKG given to Dr. Thurnell Garbe.

## 2016-07-27 NOTE — ED Notes (Signed)
Unable to do swallow screen due to pt not able to hold cup of water on own.  Pt does states numbness to left side

## 2016-07-27 NOTE — ED Notes (Signed)
Patient transported to CT 

## 2016-07-27 NOTE — ED Notes (Signed)
Pt ambulated in hallway with no assist or difficulty.

## 2016-07-27 NOTE — ED Notes (Signed)
Pt transported to MRI 

## 2016-07-27 NOTE — ED Notes (Signed)
Pt drinking water to taken medicine without difficulty

## 2016-07-27 NOTE — ED Triage Notes (Addendum)
Patient brought in by EMS. States patient was at work and had seizure. Per EMS, patient had 2nd seizure that was witnessed by them that lasted approximately 25 seconds. Patient awake but confused upon arrival to ED. States she has history of seizures but "I haven't had one in a long time."

## 2016-07-27 NOTE — ED Notes (Signed)
Seizure padding placed on bed.  

## 2016-08-08 ENCOUNTER — Encounter: Payer: Self-pay | Admitting: Neurology

## 2016-08-08 ENCOUNTER — Ambulatory Visit (INDEPENDENT_AMBULATORY_CARE_PROVIDER_SITE_OTHER): Payer: Commercial Managed Care - HMO | Admitting: Neurology

## 2016-08-08 ENCOUNTER — Telehealth: Payer: Self-pay | Admitting: Neurology

## 2016-08-08 DIAGNOSIS — I10 Essential (primary) hypertension: Secondary | ICD-10-CM | POA: Diagnosis not present

## 2016-08-08 DIAGNOSIS — G40909 Epilepsy, unspecified, not intractable, without status epilepticus: Secondary | ICD-10-CM

## 2016-08-08 DIAGNOSIS — Z0000 Encounter for general adult medical examination without abnormal findings: Secondary | ICD-10-CM | POA: Diagnosis not present

## 2016-08-08 DIAGNOSIS — E7800 Pure hypercholesterolemia, unspecified: Secondary | ICD-10-CM | POA: Diagnosis not present

## 2016-08-08 DIAGNOSIS — G43019 Migraine without aura, intractable, without status migrainosus: Secondary | ICD-10-CM | POA: Insufficient documentation

## 2016-08-08 HISTORY — DX: Migraine without aura, intractable, without status migrainosus: G43.019

## 2016-08-08 HISTORY — DX: Epilepsy, unspecified, not intractable, without status epilepticus: G40.909

## 2016-08-08 MED ORDER — POTASSIUM CHLORIDE CRYS ER 15 MEQ PO TBCR: 15.0000 meq | EXTENDED_RELEASE_TABLET | Freq: Every day | ORAL | Status: DC

## 2016-08-08 MED ORDER — TOPIRAMATE 25 MG PO TABS: 50.0000 mg | ORAL_TABLET | Freq: Two times a day (BID) | ORAL | 3 refills | Status: DC

## 2016-08-08 NOTE — Telephone Encounter (Signed)
I called the patient.  The EEG study was normal, the patient will remain on Topamax, follow-up in 3 months.

## 2016-08-08 NOTE — Procedures (Signed)
    History:  [name] is a 51 year old patient with a history of seizure-type events since age 49. The patient had 2 recent events on 07/27/2016. She has never been placed on any anticonvulsant medications, she is being evaluated for possible epilepsy.  This is a routine EEG. No skull defects are noted. Medications include multivitamins, Synthroid, hydrochlorothiazide, and cod liver oil.   EEG classification: Normal awake  Description of the recording: The background rhythms of this recording consists of a fairly well modulated medium amplitude alpha rhythm of 9 Hz that is reactive to eye opening and closure. As the record progresses, the patient appears to remain in the waking state throughout the recording. Photic stimulation was performed, resulting in a bilateral and symmetric photic driving response. Hyperventilation was also performed, resulting in a minimal buildup of the background rhythm activities without significant slowing seen. At no time during the recording does there appear to be evidence of spike or spike wave discharges or evidence of focal slowing. EKG monitor shows no evidence of cardiac rhythm abnormalities with a heart rate of 66.  Impression: This is a normal EEG recording in the waking state. No evidence of ictal or interictal discharges are seen.

## 2016-08-08 NOTE — Patient Instructions (Signed)
   With the Topamax 25 mg, start taking one tablet twice a day for 2 weeks, then take 2 tablets twice a day.  Topamax (topiramate) is a seizure medication that has an FDA approval for seizures and for migraine headache. Potential side effects of this medication include weight loss, cognitive slowing, tingling in the fingers and toes, and carbonated drinks will taste bad. If any significant side effects are noted on this drug, please contact our office.   Do not drive for 6 months from the last seizure.

## 2016-08-08 NOTE — Progress Notes (Signed)
Reason for visit: Seizures  Referring physician: Dr. Daine Floras is a 51 y.o. female  History of present illness:  [name] is a 51 year old right-handed black female with a history of seizures she claims began around age 15. The patient has had seizures off and on throughout her life, but she has never been placed on any anticonvulsant medications. Over the last 10 years she believes that she has had about 4 such seizures, the last seizure was 2 or 3 years ago. The patient suffered 2 seizures events on 07/27/2016. The patient was at work, she was feeling poorly as she developed a headache the day before and continued to have a headache the day of the seizure. The headache is bitemporal in nature. The patient began having a sensation of numbness of the left side of the body and weakness of the left side of the body. The patient had thickness of the tongue and slurred speech. This has never happened previously. The patient then went into a seizure event at work, she had a second event on the way to the hospital with EMS. The patient underwent blood work, a urine drug screen was unremarkable. She was found to have a low potassium level of 3.0. The patient is on hydrochlorothiazide. The patient underwent MRI evaluation of the brain that was unremarkable. She was placed on Keppra, but she has not continued this medication. The patient took about 2 hours to fully recover from the seizure with the left-sided symptoms. The patient has reported some blurring of vision that has been present for quite a number of months. She will be seeing her ophthalmologist in the near future. She reports that she gets about 1 or 2 headaches a week on average. She denies any balance issues or difficulty controlling the bowels or the bladder. She did not bite her tongue or lose control the bladder during the seizure. The patient claims that her mother and a brother also have seizures.  Past Medical History:    Diagnosis Date  . Asthma   . Enlarged ovary    right CT 05/2014  . Hypertension   . Hyperthyroidism   . Seizure disorder ([place]) 08/08/2016  . Seizures ([place])   . 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 Vertigo     Past Surgical History:  Procedure Laterality Date  . ABDOMINAL HYSTERECTOMY    . BALLOON DILATION N/A 05/26/2014   Procedure: BALLOON DILATION OF THE PYLORUS;  Surgeon: Danie Binder, MD;  [facility];  Service: Endoscopy;  Laterality: N/A;  . ESOPHAGOGASTRODUODENOSCOPY N/A 05/26/2014   Procedure: ESOPHAGOGASTRODUODENOSCOPY (EGD);  Surgeon: Danie Binder, MD;  [facility];  Service: Endoscopy;  Laterality: N/A;  . OVARIAN CYST SURGERY      Family History  Problem Relation Age of Onset  . Diabetes Mother   . Hypertension Mother   . Thyroid disease Mother   . Diabetes Father   . Hypertension Father   . Thyroid disease Sister   . Thyroid disease Brother     Social history:  reports that she has never smoked. She has never used smokeless tobacco. She reports that she does not drink alcohol or use drugs.  Medications:  Prior to Admission medications   Medication Sig Start Date End Date Taking? Authorizing Provider  COD LIVER OIL PO Take 2 tablets by mouth daily.   Yes Historical Provider, MD  hydrochlorothiazide (HYDRODIURIL) 25 MG tablet Take 0.5 tablets (12.5 mg total) by mouth daily.  Patient taking differently: Take 25 mg by mouth daily.  06/15/14  Yes Rexene Alberts, MD  Iron Combinations (CHROMAGEN) capsule Take 1 capsule by mouth daily.   Yes Historical Provider, MD  levothyroxine (SYNTHROID, LEVOTHROID) 88 MCG tablet TAKE 1 TABLET (88 MCG TOTAL) BY MOUTH DAILY. 01/31/16  Yes Elayne Snare, MD  Multiple Vitamin (MULTIVITAMIN WITH MINERALS) TABS tablet Take 1 tablet by mouth daily.   Yes Historical Provider, MD  OVER THE COUNTER MEDICATION Take 1 tablet by mouth daily. Vitamin for bones   Yes Historical Provider, MD      Allergies  Allergen Reactions  .  Contrast Media [Iodinated Diagnostic Agents] Rash  . Iohexol Nausea And Vomiting    Pt. Vomited immediately after injection of Omni 350.    . Vicodin [Hydrocodone-Acetaminophen] Rash    ROS:  Out of a complete 14 system review of symptoms, the patient complains only of the following symptoms, and all other reviewed systems are negative.  Fatigue Blurred vision Shortness of breath Feeling hot Headache, numbness, dizziness, seizures, passing out Not enough sleep, decreased energy, disinterest in activities Insomnia  Blood pressure 117/82, pulse 80, resp. rate 16, height 5\' 4"  (1.626 m), weight 153 lb (69.4 kg).  Physical Exam  General: The patient is alert and cooperative at the time of the examination.  Eyes: Pupils are equal, round, and reactive to light. Discs are flat bilaterally.  Neck: The neck is supple, no carotid bruits are noted.  Respiratory: The respiratory examination is clear.  Cardiovascular: The cardiovascular examination reveals a regular rate and rhythm, no obvious murmurs or rubs are noted.  Skin: Extremities are without significant edema.  Neurologic Exam  Mental status: The patient is alert and oriented x 3 at the time of the examination. The patient has apparent normal recent and remote memory, with an apparently normal attention span and concentration ability.  Cranial nerves: Facial symmetry is present. There is good sensation of the face to pinprick and soft touch bilaterally. The strength of the facial muscles and the muscles to head turning and shoulder shrug are normal bilaterally. Speech is well enunciated, no aphasia or dysarthria is noted. Extraocular movements are full. Visual fields are full. The tongue is midline, and the patient has symmetric elevation of the soft palate. No obvious hearing deficits are noted.  Motor: The motor testing reveals 5 over 5 strength of all 4 extremities. Good symmetric motor tone is noted throughout.  Sensory:  Sensory testing is intact to pinprick, soft touch, vibration sensation, and position sense on all 4 extremities. No evidence of extinction is noted.  Coordination: Cerebellar testing reveals good finger-nose-finger and heel-to-shin bilaterally.  Gait and station: Gait is normal. Tandem gait is normal. Romberg is negative. No drift is seen.  Reflexes: Deep tendon reflexes are symmetric and normal bilaterally. Toes are downgoing bilaterally.   MRI brain 07/27/16:  IMPRESSION: No acute intracranial abnormality.  Stable since 2013 and negative noncontrast MRI appearance of the brain.  * MRI scan images were reviewed online. I agree with the written report.    Assessment/Plan:  1. History of seizures, recent recurrence  2. Frequent headache  The patient has had a handful of seizures throughout her life, she has never been on anticonvulsant therapy. She was given Keppra, she did not continue this medication. The patient will be placed on Topamax for the headache and for the seizures, she is not to operate a motor vehicle for 6 months following the last seizure event. The patient will  follow-up in 3 months. She is to contact our office if another event occurs. EEG evaluation will be ordered.  Jill Alexanders MD 08/08/2016 7:28 AM  [facility] [address] [place], [place] [address]  Phone [phone] Fax [phone]

## 2016-08-10 ENCOUNTER — Telehealth: Payer: Self-pay | Admitting: Neurology

## 2016-08-10 MED ORDER — ZONISAMIDE 25 MG PO CAPS: ORAL_CAPSULE | ORAL | 3 refills | Status: DC

## 2016-08-10 NOTE — Telephone Encounter (Signed)
Pt has taken 2 doses of topiramate and is having shortness of breath, jittery, n&v and diarrhea. Please call

## 2016-08-10 NOTE — Telephone Encounter (Signed)
I called patient. The Topamax resulted in nausea and vomiting, the patient cannot tolerate the drug. We will try Zonegran instead.

## 2016-08-11 ENCOUNTER — Other Ambulatory Visit: Payer: Self-pay | Admitting: Endocrinology

## 2016-08-14 ENCOUNTER — Telehealth: Payer: Self-pay | Admitting: Endocrinology

## 2016-08-14 MED ORDER — LEVOTHYROXINE SODIUM 75 MCG PO TABS: 75.0000 ug | ORAL_TABLET | Freq: Every day | ORAL | 3 refills | Status: DC

## 2016-08-14 NOTE — Telephone Encounter (Signed)
Pt called in and said that a few days after she saw Dr. Dwyane Dee the other week, she experienced seizures and was admitted to the hospital, she said that she began a new medication and then started experiencing palpitations.  She said she was advised that Dr. Dwyane Dee may want to change her dosage of Levothyroxine, she would like to know what she needs to do.

## 2016-08-14 NOTE — Telephone Encounter (Signed)
See message and please advise, Thanks!  

## 2016-08-14 NOTE — Telephone Encounter (Signed)
Patient notified of message and voiced understanding. Rx for 75 mcg of levothyroxine submitted to the pharmacy.

## 2016-08-14 NOTE — Telephone Encounter (Signed)
Her thyroid was just checked and should not be causing palpitations. She is supposed to start taking 75 g daily and she can switch at this time.  If need be we can check her thyroid level again in another month with labs and follow-up visit

## 2016-08-15 ENCOUNTER — Telehealth: Payer: Self-pay | Admitting: *Deleted

## 2016-08-15 NOTE — Telephone Encounter (Signed)
Called and spoke to pt. She is requesting FMLA d/t trying to adjust to new medications. She is very fatigued, weak. Has not been to work since 08/06/16. 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 She is so weak, it is hard for her to take a shower. She is requesting at least a month off work. She is going to fax forms to [phone] today. Advised CW,MD will have to review forms first and it is up to him if he will approve this. We will keep her up to date. She verbalized understanding.

## 2016-08-16 MED ORDER — LEVETIRACETAM 500 MG PO TABS: 500.0000 mg | ORAL_TABLET | Freq: Two times a day (BID) | ORAL | 3 refills | Status: DC

## 2016-08-16 NOTE — Telephone Encounter (Signed)
From a neurologic standpoint, there should be nothing that would keep her from going back to work. I will call the patient, it is possible she still may be having a medication side effect, she may require a full medical evaluation through her primary care physician.  We saw her only for a seizure event, I am not clear why she is having such severe problems with fatigue.   I called the patient. The patient has had severe fatigue and malaise since the seizure. She did not tolerate Topamax whatsoever, she took 2 pills and had to stop because of nausea and vomiting.  She still does not feel well, she is on Zonegran, it is possible she may be still having medication side effects.  She is to go down on Zonegran taking 1 twice daily for 2 weeks and then stop the drug. She will go back on Keppra taking 500 mg twice daily. She seemed to tolerate Keppra better.  She feels so poorly she once a leave of absence from work for one month. She reports shortness of breath, severe fatigue.

## 2016-08-16 NOTE — Telephone Encounter (Signed)
Dr Jannifer Franklin- Please advise. I am going to have medical records send her medical records as requested. I wanted to forward to you so you knew what she was requesting. I did let her know it would be up to you whether you would approve this.

## 2016-08-16 NOTE — Telephone Encounter (Signed)
Called patient back. Advised the only thing I received via fax was a medical release form to fax her records to [employer]. 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 She stated there were no forms for her to have filled out. They only requested medical records from our office. She states they were going to call to request this information. She will call back if forms do need to be filled out.

## 2016-08-28 DIAGNOSIS — M9902 Segmental and somatic dysfunction of thoracic region: Secondary | ICD-10-CM | POA: Diagnosis not present

## 2016-08-28 DIAGNOSIS — M9903 Segmental and somatic dysfunction of lumbar region: Secondary | ICD-10-CM | POA: Diagnosis not present

## 2016-08-28 DIAGNOSIS — M5441 Lumbago with sciatica, right side: Secondary | ICD-10-CM | POA: Diagnosis not present

## 2016-09-11 DIAGNOSIS — J069 Acute upper respiratory infection, unspecified: Secondary | ICD-10-CM | POA: Diagnosis not present

## 2016-09-11 DIAGNOSIS — J019 Acute sinusitis, unspecified: Secondary | ICD-10-CM | POA: Diagnosis not present

## 2016-09-21 ENCOUNTER — Other Ambulatory Visit: Payer: Self-pay | Admitting: Family Medicine

## 2016-09-21 ENCOUNTER — Other Ambulatory Visit: Payer: Self-pay

## 2016-09-21 DIAGNOSIS — Z1231 Encounter for screening mammogram for malignant neoplasm of breast: Secondary | ICD-10-CM

## 2016-10-04 IMAGING — CT CT ABD-PELV W/O CM
2 of 4 series · 16 of 46 positions shown, 18 images · non-contrast
Comparison: CT Abdomen and Pelvis 05/11/2014 and earlier

CLINICAL DATA: 48-year-old female abdominal pain and vomiting.
Endoscopy yesterday. Initial encounter.

EXAM:
CT ABDOMEN AND PELVIS WITHOUT CONTRAST
TECHNIQUE: Multidetector CT imaging of the abdomen and pelvis was performed
following the standard protocol without IV contrast.

[Series 2: abdomen/pelvis w/o contrast · axial · non-contrast · 0.66mm/px · z∈[-466,-56]mm · 13 of 90 slices shown, 15 images]
[im 4/90  soft-tissue]
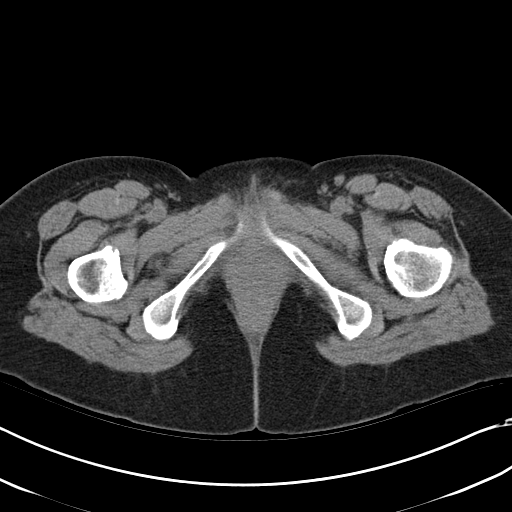
[im 4/90  bone]
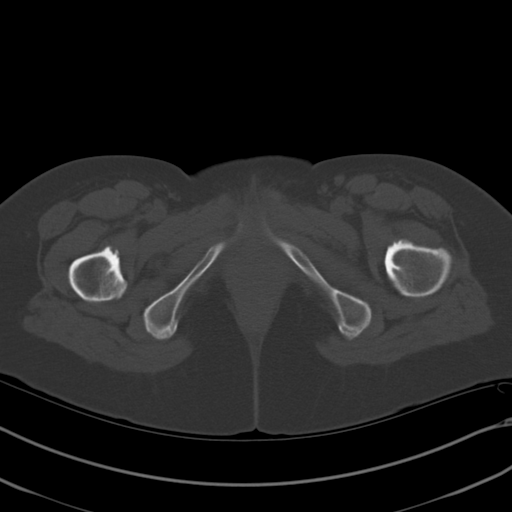
[im 12/90  soft-tissue]
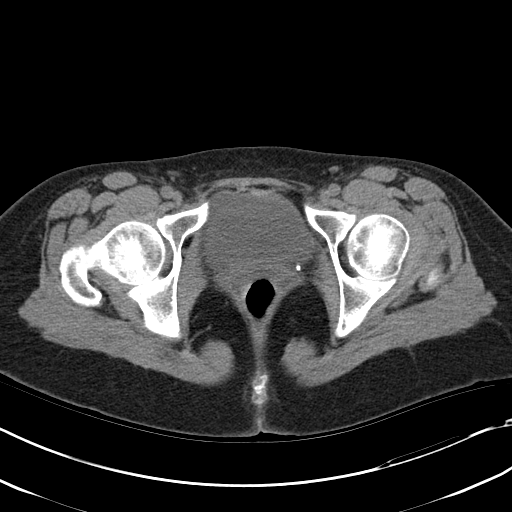
[im 19/90  soft-tissue]
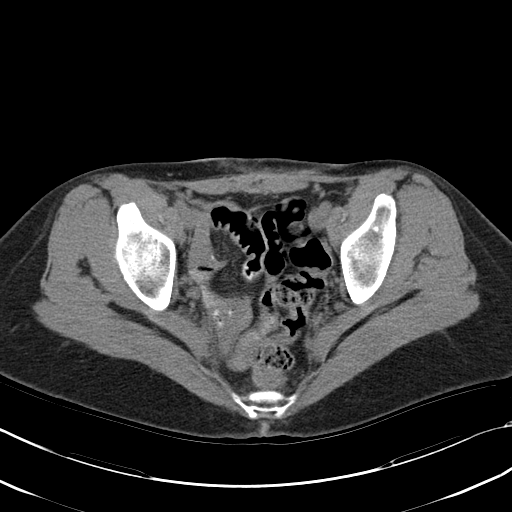
[im 26/90  soft-tissue]
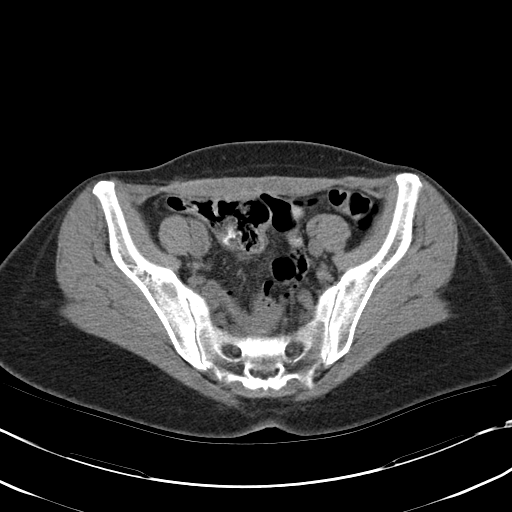
[im 30/90  soft-tissue]
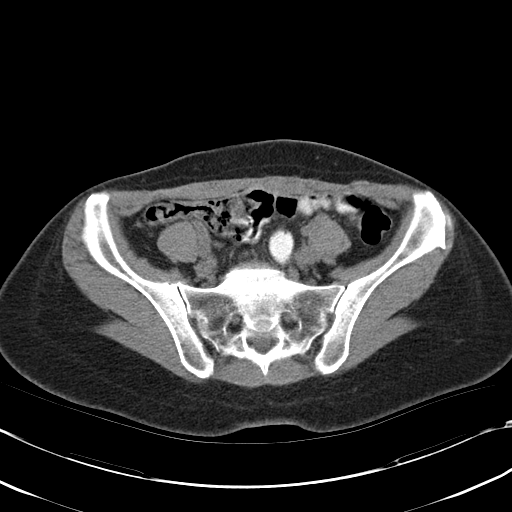
[im 38/90  soft-tissue]
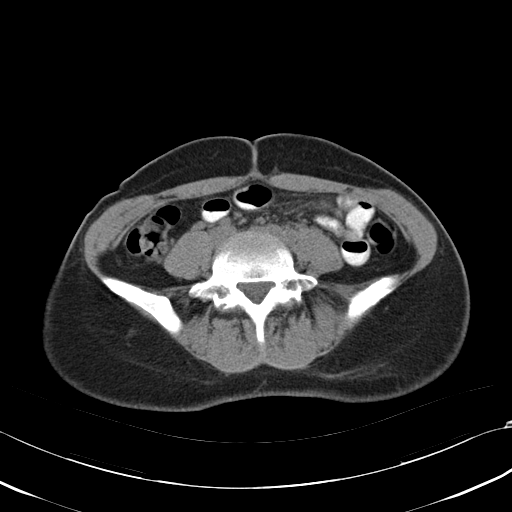
[im 45/90  soft-tissue]
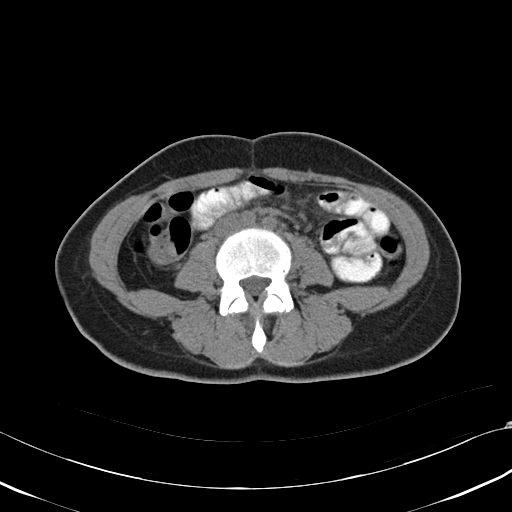
[im 52/90  soft-tissue]
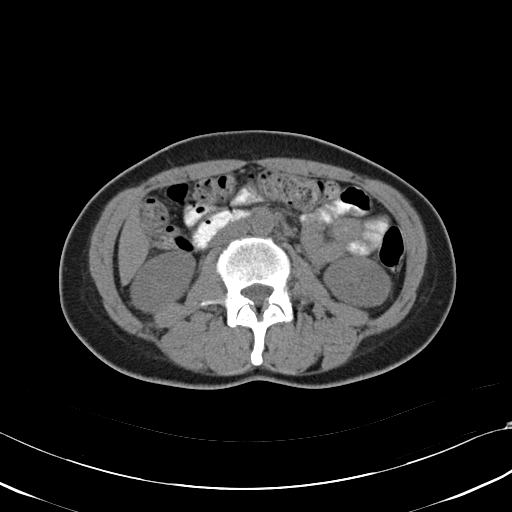
[im 60/90  soft-tissue]
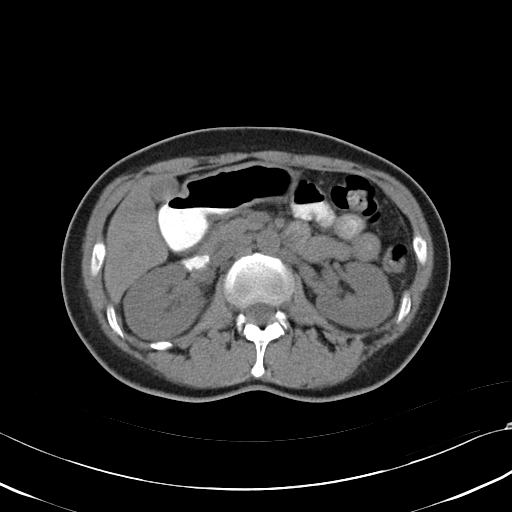
[im 60/90  bone]
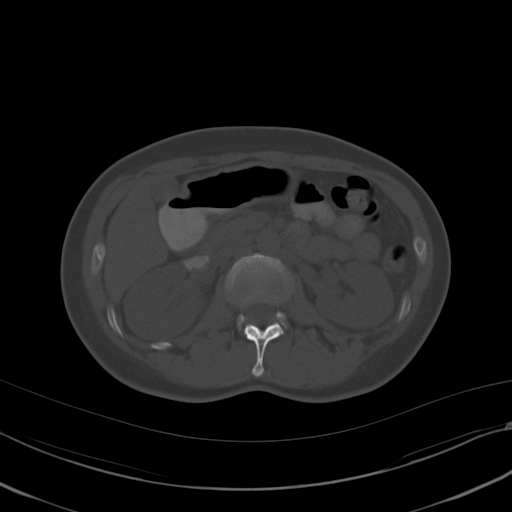
[im 64/90  soft-tissue]
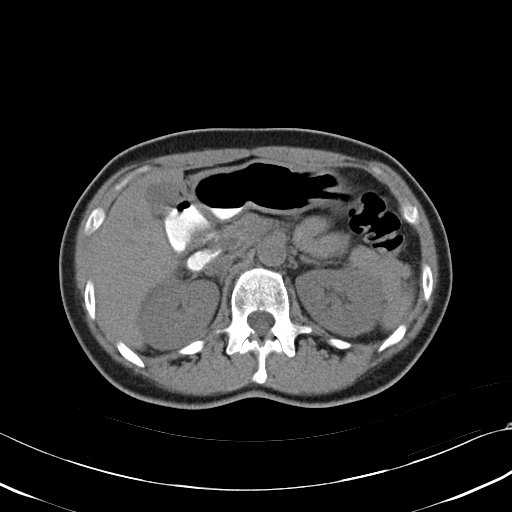
[im 71/90  soft-tissue]
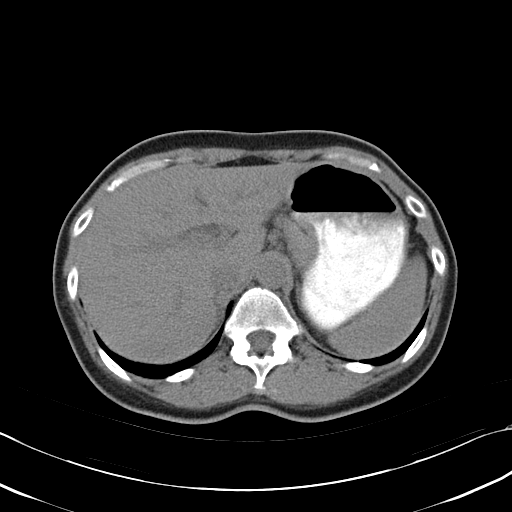
[im 78/90  soft-tissue]
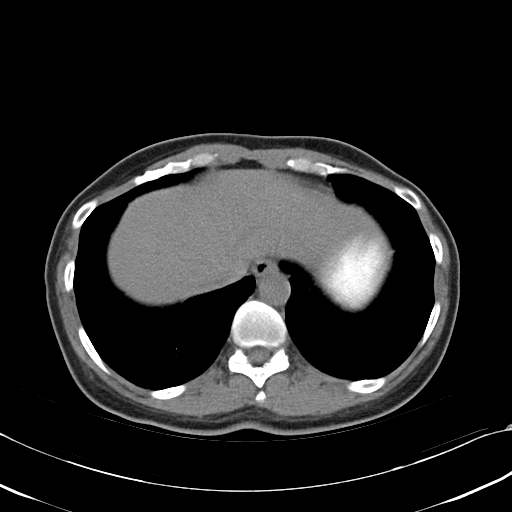
[im 86/90  soft-tissue]
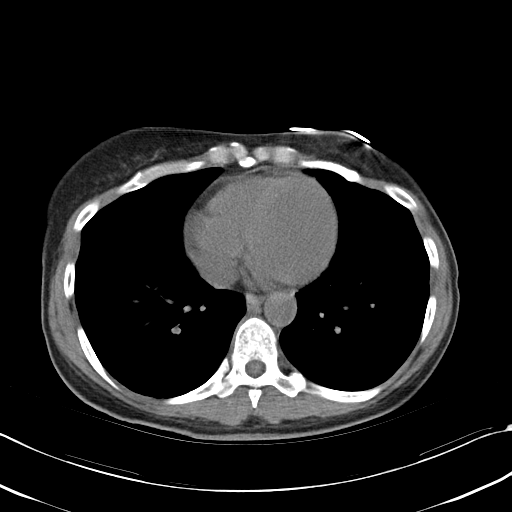

[Series 3: mpr cor (id) · coronal · 0.63mm/px · 3 of 71 slices shown]
[im 24/71  soft-tissue]
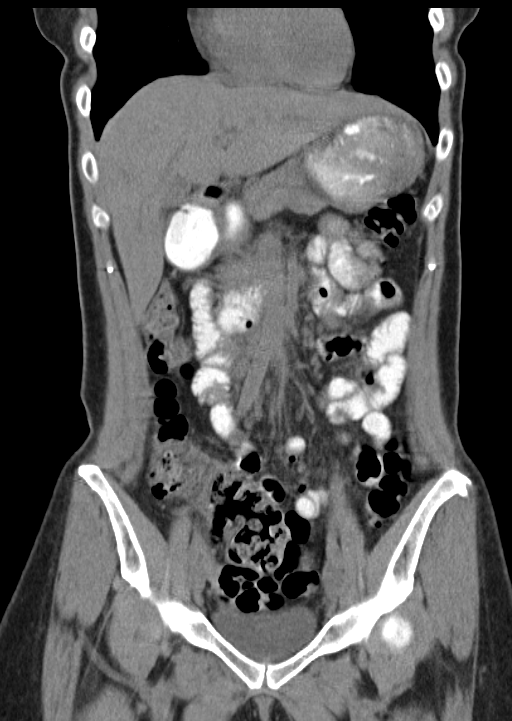
[im 32/71  soft-tissue]
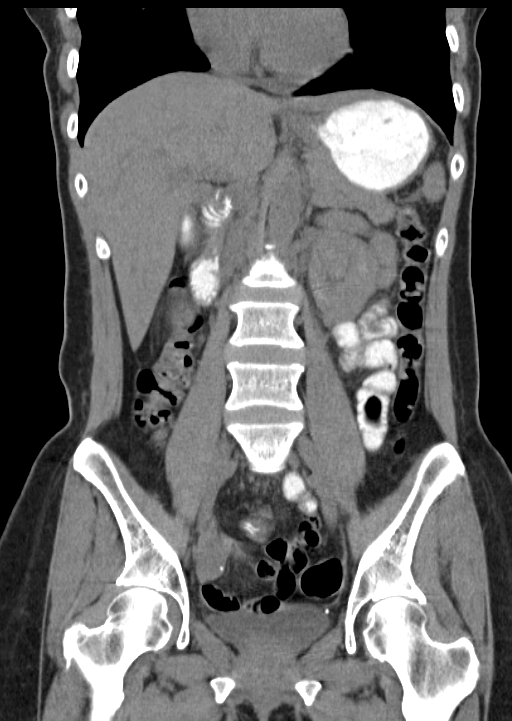
[im 39/71  soft-tissue]
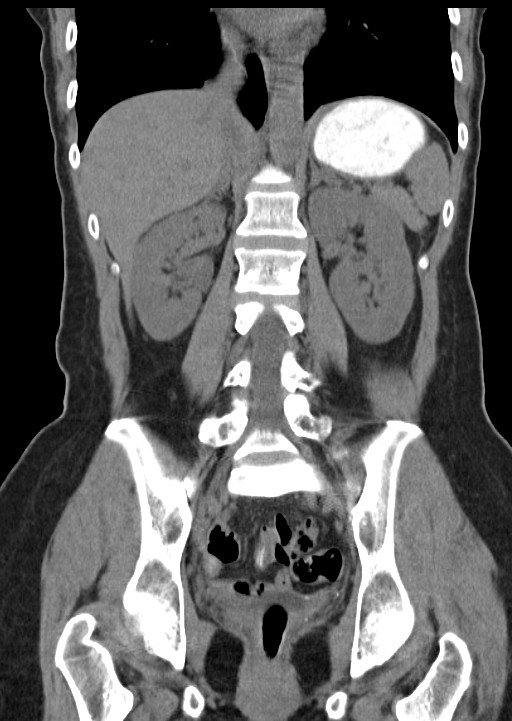

[16 of 46 positions shown; findings below may reference images not displayed]

FINDINGS: minor lung base atelectasis. No pericardial or pleural effusion. No
acute osseous abnormality identified.

Diminutive bladder. No pelvic free fluid. Uterus surgically absent.
Stable right ovary.

No large bowel inflammation identified. Oral contrast has reached
the terminal ileum. Appendix not identified today. No dilated small
bowel. Negative stomach. No definite duodenum inflammation.

Noncontrast liver, gallbladder, spleen pancreas, adrenal glands, and
kidneys appear stable and within normal limits. No abdominal free
fluid or free air.
IMPRESSION: No definite acute or inflammatory process identified in the abdomen
or pelvis in the absence of IV contrast.

## 2016-11-13 DIAGNOSIS — M9902 Segmental and somatic dysfunction of thoracic region: Secondary | ICD-10-CM | POA: Diagnosis not present

## 2016-11-13 DIAGNOSIS — M9903 Segmental and somatic dysfunction of lumbar region: Secondary | ICD-10-CM | POA: Diagnosis not present

## 2016-11-13 DIAGNOSIS — M5441 Lumbago with sciatica, right side: Secondary | ICD-10-CM | POA: Diagnosis not present

## 2016-11-16 ENCOUNTER — Ambulatory Visit (INDEPENDENT_AMBULATORY_CARE_PROVIDER_SITE_OTHER): Payer: 59 | Admitting: Adult Health

## 2016-11-16 ENCOUNTER — Encounter: Payer: Self-pay | Admitting: Adult Health

## 2016-11-16 ENCOUNTER — Encounter (INDEPENDENT_AMBULATORY_CARE_PROVIDER_SITE_OTHER): Payer: Self-pay

## 2016-11-16 VITALS — BP 117/83 | HR 76 | Ht 64.0 in | Wt 155.0 lb

## 2016-11-16 DIAGNOSIS — R569 Unspecified convulsions: Secondary | ICD-10-CM

## 2016-11-16 MED ORDER — LEVETIRACETAM 250 MG PO TABS: 250.0000 mg | ORAL_TABLET | Freq: Two times a day (BID) | ORAL | 5 refills | Status: DC

## 2016-11-16 NOTE — Patient Instructions (Signed)
Retry Keppra: 250 mg twice a day You should NOT operate a motor vehicle, operate heavy machinery, perform activities at heights or participate in water activities until 6 months seizure free If you have any seizure events please let us know.

## 2016-11-16 NOTE — Progress Notes (Signed)
PATIENT: [name] DOB: 14-Jul-1965  REASON FOR VISIT: follow up- seizures  HISTORY FROM: patient  HISTORY OF PRESENT ILLNESS: Today 11/16/16 [name] is a 51 year old female with a history of seizures. She returns today for follow-up. At the last visit she was started on Topamax however she was unable to tolerate this. She was then placed on Zonegran. She is also unable to tolerate this. She was put back on Keppra 500 mg twice a day. She states that Her made her feel very fatigued therefore she did not take his medication very long. She states that she would rather not take daily medication. She feels that her seizure in April was due to her low potassium levels. She states that she did not take her potassium pill for 3 days. She states that she rarely drives but she does drive if she does not have a ride. She is able to complete all ADLs independently. She denies any new neurological symptoms. She denies any seizure events. She returns today for an evaluation.  HISTORY 08/08/16: [name] is a 51 year old right-handed black female with a history of seizures she claims began around age 34. The patient has had seizures off and on throughout her life, but she has never been placed on any anticonvulsant medications. Over the last 10 years she believes that she has had about 4 such seizures, the last seizure was 2 or 3 years ago. The patient suffered 2 seizures events on 07/27/2016. The patient was at work, she was feeling poorly as she developed a headache the day before and continued to have a headache the day of the seizure. The headache is bitemporal in nature. The patient began having a sensation of numbness of the left side of the body and weakness of the left side of the body. The patient had thickness of the tongue and slurred speech. This has never happened previously. The patient then went into a seizure event at work, she had a second event on the way to the hospital with EMS. The patient  underwent blood work, a urine drug screen was unremarkable. She was found to have a low potassium level of 3.0. The patient is on hydrochlorothiazide. The patient underwent MRI evaluation of the brain that was unremarkable. She was placed on Keppra, but she has not continued this medication. The patient took about 2 hours to fully recover from the seizure with the left-sided symptoms. The patient has reported some blurring of vision that has been present for quite a number of months. She will be seeing her ophthalmologist in the near future. She reports that she gets about 1 or 2 headaches a week on average. She denies any balance issues or difficulty controlling the bowels or the bladder. She did not bite her tongue or lose control the bladder during the seizure. The patient claims that her mother and a brother also have seizures.   REVIEW OF SYSTEMS: Out of a complete 14 system review of symptoms, the patient complains only of the following symptoms, and all other reviewed systems are negative.  Frequency of urination, dizziness, headache, joint pain, restless leg, insomnia, heat intolerance, blurred vision, fatigue  ALLERGIES: Allergies  Allergen Reactions  . Topamax [Topiramate]     Nausea and vomiting  . Contrast Media [Iodinated Diagnostic Agents] Rash  . Iohexol Nausea And Vomiting    Pt. Vomited immediately after injection of Omni 350.    . Vicodin [Hydrocodone-Acetaminophen] Rash    HOME MEDICATIONS: Outpatient Medications Prior to  Visit  Medication Sig Dispense Refill  . COD LIVER OIL PO Take 2 tablets by mouth daily.    . hydrochlorothiazide (HYDRODIURIL) 25 MG tablet Take 0.5 tablets (12.5 mg total) by mouth daily. (Patient taking differently: Take 25 mg by mouth daily. )  0  . Iron Combinations (CHROMAGEN) capsule Take 1 capsule by mouth daily.    Marland Kitchen levETIRAcetam (KEPPRA) 500 MG tablet Take 1 tablet (500 mg total) by mouth 2 (two) times daily. 60 tablet 3  . levothyroxine  (SYNTHROID, LEVOTHROID) 75 MCG tablet Take 1 tablet (75 mcg total) by mouth daily. 90 tablet 3  . Multiple Vitamin (MULTIVITAMIN WITH MINERALS) TABS tablet Take 1 tablet by mouth daily.    Marland Kitchen OVER THE COUNTER MEDICATION Take 1 tablet by mouth daily. Vitamin for bones    . potassium chloride SA (KLOR-CON M15) 15 MEQ tablet Take 1 tablet (15 mEq total) by mouth daily.     No facility-administered medications prior to visit.     PAST MEDICAL HISTORY: Past Medical History:  Diagnosis Date  . Asthma   . Common migraine with intractable migraine 08/08/2016  . Enlarged ovary    right CT 05/2014  . Hypertension   . Hyperthyroidism   . Seizure disorder ([place]) 08/08/2016  . Seizures ([place])   . 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 Vertigo     PAST SURGICAL HISTORY: Past Surgical History:  Procedure Laterality Date  . ABDOMINAL HYSTERECTOMY    . BALLOON DILATION N/A 05/26/2014   Procedure: BALLOON DILATION OF THE PYLORUS;  Surgeon: Danie Binder, MD;  [facility];  Service: Endoscopy;  Laterality: N/A;  . ESOPHAGOGASTRODUODENOSCOPY N/A 05/26/2014   Procedure: ESOPHAGOGASTRODUODENOSCOPY (EGD);  Surgeon: Danie Binder, MD;  [facility];  Service: Endoscopy;  Laterality: N/A;  . OVARIAN CYST SURGERY      FAMILY HISTORY: Family History  Problem Relation Age of Onset  . Diabetes Mother   . Hypertension Mother   . Thyroid disease Mother   . Seizures Mother   . Diabetes Father   . Hypertension Father   . Thyroid disease Sister   . Thyroid disease Brother   . Seizures Brother     SOCIAL HISTORY: Social History   Social History  . Marital status: Married    Spouse name: N/A  . Number of children: 2  . Years of education: HS   Occupational History  . Not on file.   Social History Main Topics  . Smoking status: Never Smoker  . Smokeless tobacco: Never Used  . Alcohol use No  . Drug use: No  . Sexual activity: Yes    Birth control/ protection: Surgical   Other Topics  Concern  . Not on file   Social History Narrative   Caffeine: 1-2 times a week       PHYSICAL EXAM  Vitals:   11/16/16 0745  BP: 117/83  Pulse: 76  Weight: 155 lb (70.3 kg)  Height: 5\' 4"  (1.626 m)   Body mass index is 26.61 kg/m.  Generalized: Well developed, in no acute distress   Neurological examination  Mentation: Alert oriented to time, place, history taking. Follows all commands speech and language fluent Cranial nerve II-XII: Pupils were equal round reactive to light. Extraocular movements were full, visual field were full on confrontational test. Facial sensation and strength were normal. Uvula tongue midline. Head turning and shoulder shrug  were normal and symmetric. Motor: The motor testing reveals 5 over 5 strength of  all 4 extremities. Good symmetric motor tone is noted throughout.  Sensory: Sensory testing is intact to soft touch on all 4 extremities. No evidence of extinction is noted.  Coordination: Cerebellar testing reveals good finger-nose-finger and heel-to-shin bilaterally.  Gait and station: Gait is normal. Tandem gait is normal. Romberg is negative. No drift is seen.  Reflexes: Deep tendon reflexes are symmetric and normal bilaterally.   DIAGNOSTIC DATA (LABS, IMAGING, TESTING) - I reviewed patient records, labs, notes, testing and imaging myself where available.  Lab Results  Component Value Date   WBC 6.6 07/27/2016   HGB 14.6 07/27/2016   HCT 43.8 07/27/2016   MCV 88.7 07/27/2016   PLT 221 07/27/2016      Component Value Date/Time   NA 140 07/27/2016 1320   K 3.0 (L) 07/27/2016 1320   CL 102 07/27/2016 1320   CO2 27 07/27/2016 1320   GLUCOSE 113 (H) 07/27/2016 1320   BUN 17 07/27/2016 1320   CREATININE 0.77 07/27/2016 1320   CALCIUM 10.2 07/27/2016 1320   CALCIUM 9.5 05/23/2014 1709   PROT 8.5 (H) 07/27/2016 1320   ALBUMIN 4.9 07/27/2016 1320   AST 23 07/27/2016 1320   ALT 15 07/27/2016 1320   ALKPHOS 96 07/27/2016 1320   BILITOT  0.6 07/27/2016 1320   GFRNONAA >60 07/27/2016 1320   GFRAA >60 07/27/2016 1320   Lab Results  Component Value Date   CHOL 118 06/02/2014   HDL 20 (L) 06/02/2014   LDLCALC 85 06/02/2014   TRIG 63 06/02/2014   CHOLHDL 5.9 06/02/2014   Lab Results  Component Value Date   HGBA1C 5.7 09/10/2015   Lab Results  Component Value Date   VITAMINB12 396 06/10/2014   Lab Results  Component Value Date   TSH 0.42 07/21/2016      ASSESSMENT AND PLAN 51 y.o. year old female  has a past medical history of Asthma; Common migraine with intractable migraine (08/08/2016); Enlarged ovary; Hypertension; Hyperthyroidism; Seizure disorder ([place]) (08/08/2016); Seizures ([place]); Thyroid storm (06/09/2014); and Vertigo. here with:  1. Seizures  Patient has not had any additional seizure events. She does not wish to be on medication daily. I had a long discussion with the patient in regards to medication. I did explain to the patient risk associated with seizure events. I recommended that she retry Keppra at a lower dose 250 mg twice a day. She is amenable to this plan. I will send in a new prescription. She should not operate a motor vehicle, operate heavy machinery perform activities at [facility] or participate in water activities until she is seizure-free for 6 months. She voiced understanding. She will follow-up in 3 months or sooner if needed.   I spent 15 minutes with the patient. 50% of this time was spent discussing seizure precautions and risk associated with seizure events.     Ward Givens, MSN, NP-C 11/16/2016, 8:03 AM [facility] [address], [place], [place] [address] [phone]

## 2016-11-17 NOTE — Progress Notes (Signed)
I have read the note, and I agree with the clinical assessment and plan.  Clayborne Divis KEITH   

## 2016-12-14 ENCOUNTER — Ambulatory Visit
Admission: RE | Admit: 2016-12-14 | Discharge: 2016-12-14 | Disposition: A | Discharge status: Home / Self Care | Payer: 59 | Source: Ambulatory Visit | Attending: Family Medicine | Admitting: Family Medicine

## 2016-12-14 ENCOUNTER — Ambulatory Visit: Payer: Commercial Managed Care - HMO

## 2016-12-14 DIAGNOSIS — Z01419 Encounter for gynecological examination (general) (routine) without abnormal findings: Secondary | ICD-10-CM | POA: Diagnosis not present

## 2016-12-14 DIAGNOSIS — Z1231 Encounter for screening mammogram for malignant neoplasm of breast: Secondary | ICD-10-CM

## 2016-12-14 DIAGNOSIS — N951 Menopausal and female climacteric states: Secondary | ICD-10-CM | POA: Diagnosis not present

## 2017-01-25 ENCOUNTER — Other Ambulatory Visit (INDEPENDENT_AMBULATORY_CARE_PROVIDER_SITE_OTHER): Payer: 59

## 2017-01-25 DIAGNOSIS — E890 Postprocedural hypothyroidism: Secondary | ICD-10-CM

## 2017-01-25 LAB — T4, FREE: FREE T4: 1.04 ng/dL (ref 0.60–1.60)

## 2017-01-25 LAB — TSH: TSH: 5.03 u[IU]/mL — AB (ref 0.35–4.50)

## 2017-01-29 ENCOUNTER — Encounter: Payer: Self-pay | Admitting: Endocrinology

## 2017-01-29 ENCOUNTER — Ambulatory Visit (INDEPENDENT_AMBULATORY_CARE_PROVIDER_SITE_OTHER): Payer: 59 | Admitting: Endocrinology

## 2017-01-29 VITALS — BP 116/68 | HR 70 | Ht 64.0 in | Wt 151.4 lb

## 2017-01-29 DIAGNOSIS — E890 Postprocedural hypothyroidism: Secondary | ICD-10-CM

## 2017-01-29 NOTE — Progress Notes (Signed)
Patient ID: [name], female   DOB: [birth year], 51 y.o.   MRN: [id number]            Reason for Appointment:  Hypothyroidism, follow-up visit    History of Present Illness:   Background information: She was treated for hyperthyroidism in 05/2014; this was diagnosed when she was admitted to the hospital with nausea, weight loss of 20-25 pounds and constipation.  She was then found to have significant tachycardia and Hyperthyroidism  was diagnosed She got 15 mCi in 06/2014 and subsequently got hypothyroid, details of when she first went on thyroid supplements are unknown. She was initially treated with 75 g of levothyroxine Subsequently been TSH was 11 this was increased to 112 g which however brought her TSH down to 0.11 She was then started on 100 g of levothyroxine In 04/2015 she had an episode of syncope along with symptoms of nausea and was evaluated in an emergency room in [place] where she was found to have a TSH of 0.15 Subsequently had been on 50 g of levothyroxine which caused her to have significant fatigue, nausea and dizziness  RECENT history: Since her TSH was 25.7 her Synthroid was increased from 50 up to 88 g in  05/2015 Subsequently when her TSH was low normal she was told to take a half tablet less per week  More recently her TSH was low normal in 4/18 and since then she has been taking 75 g daily of her levothyroxine  She now says that she is feeling very good and is able to walk 3 miles a day and keep her weight down She does occasionally feel sluggish She is going through menopause and has having hot flashes, no cold intolerance Also has had some recent dry eyes but No prominence of the eyes No dry skin  She is very compliant with taking her medication in the morning before breakfast with juice Her weight is about the same   TSH is relatively high at 5  Patient's weight history is as follows:  Wt Readings from Last 3 Encounters:  01/29/17 151 lb 6.4  oz (68.7 kg)  11/16/16 155 lb (70.3 kg)  08/08/16 153 lb (69.4 kg)    Thyroid function results have been as follows:  Lab Results  Component Value Date   TSH 5.03 (H) 01/25/2017   TSH 0.42 07/21/2016   TSH 1.86 01/17/2016   FREET4 1.04 01/25/2017   FREET4 1.35 07/21/2016   FREET4 0.96 01/17/2016    Lab Results  Component Value Date   TSH 5.03 (H) 01/25/2017   TSH 0.42 07/21/2016   TSH 1.86 01/17/2016   TSH 0.96 06/10/2015   TSH 25.67 (H) 05/11/2015   TSH 0.11 (A) 03/01/2015   TSH 11.04 (H) 01/27/2015   TSH 25.71 (H) 01/13/2015   TSH <0.010 (L) 06/13/2014     Past Medical History:  Diagnosis Date  . Asthma   . Common migraine with intractable migraine 08/08/2016  . Enlarged ovary    right CT 05/2014  . Hypertension   . Hyperthyroidism   . Seizure disorder ([place]) 08/08/2016  . Seizures ([place])   . 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 Vertigo     Past Surgical History:  Procedure Laterality Date  . ABDOMINAL HYSTERECTOMY    . BALLOON DILATION N/A 05/26/2014   Procedure: BALLOON DILATION OF THE PYLORUS;  Surgeon: Danie Binder, MD;  [facility];  Service: Endoscopy;  Laterality: N/A;  . ESOPHAGOGASTRODUODENOSCOPY N/A 05/26/2014   Procedure:  ESOPHAGOGASTRODUODENOSCOPY (EGD);  Surgeon: Danie Binder, MD;  Location: [facility];  Service: Endoscopy;  Laterality: N/A;  . OVARIAN CYST SURGERY      Family History  Problem Relation Age of Onset  . Diabetes Mother   . Hypertension Mother   . Thyroid disease Mother   . Seizures Mother   . Diabetes Father   . Hypertension Father   . Thyroid disease Sister   . Thyroid disease Brother   . Seizures Brother     Social History:  reports that she has never smoked. She has never used smokeless tobacco. She reports that she does not drink alcohol or use drugs.  Allergies:  Allergies  Allergen Reactions  . Topamax [Topiramate]     Nausea and vomiting  . Contrast Media [Iodinated Diagnostic Agents] Rash  . Iohexol  Nausea And Vomiting    Pt. Vomited immediately after injection of Omni 350.    . Vicodin [Hydrocodone-Acetaminophen] Rash    Allergies as of 01/29/2017      Reactions   Topamax [topiramate]    Nausea and vomiting   Contrast Media [iodinated Diagnostic Agents] Rash   Iohexol Nausea And Vomiting   Pt. Vomited immediately after injection of Omni 350.     Vicodin [hydrocodone-acetaminophen] Rash      Medication List       Accurate as of 01/29/17  1:20 PM. Always use your most recent med list.          chromagen capsule Take 1 capsule by mouth daily.   COD LIVER OIL PO Take 2 tablets by mouth daily.   hydrochlorothiazide 25 MG tablet Commonly known as:  HYDRODIURIL Take 0.5 tablets (12.5 mg total) by mouth daily.   levETIRAcetam 250 MG tablet Commonly known as:  KEPPRA Take 1 tablet (250 mg total) by mouth 2 (two) times daily.   levothyroxine 75 MCG tablet Commonly known as:  SYNTHROID, LEVOTHROID Take 1 tablet (75 mcg total) by mouth daily.   multivitamin with minerals Tabs tablet Take 1 tablet by mouth daily.   OVER THE COUNTER MEDICATION Take 1 tablet by mouth daily. Vitamin for bones   potassium chloride SA 15 MEQ tablet Commonly known as:  KLOR-CON M15 Take 1 tablet (15 mEq total) by mouth daily.          Review of Systems    She has long-standing history of seizures             Examination:    BP 116/68   Pulse 70   Ht 5\' 4"  (1.626 m)   Wt 151 lb 6.4 oz (68.7 kg)   SpO2 98%   BMI 25.99 kg/m   She looks well   Assessment:  HYPOTHYROIDISM, secondary to radioactive iodine treatment Graves' disease in early 2016 She has been on variable doses of levothyroxine  She is doing subjectively fairly well with her energy level  Despite only making minor reduction in her thyroxine dosage this year she has now had a TSH of 5.0 This is despite her losing weight Subjectivelysymptoms of hypothyroidism   Hypertension: She will follow-up with PCP and  have potassium monitored also  PLAN:   Continue the dose of Synthroid 75 g daily but she will add another half tablet weekly   Follow-up in 3 months with repeat labs    [facility] 01/29/2017, 1:20 PM

## 2017-01-29 NOTE — Patient Instructions (Signed)
Extra extra 1/2 pill weekly of thyroid

## 2017-02-05 DIAGNOSIS — M9903 Segmental and somatic dysfunction of lumbar region: Secondary | ICD-10-CM | POA: Diagnosis not present

## 2017-02-05 DIAGNOSIS — M5441 Lumbago with sciatica, right side: Secondary | ICD-10-CM | POA: Diagnosis not present

## 2017-02-05 DIAGNOSIS — M9902 Segmental and somatic dysfunction of thoracic region: Secondary | ICD-10-CM | POA: Diagnosis not present

## 2017-02-12 DIAGNOSIS — E7800 Pure hypercholesterolemia, unspecified: Secondary | ICD-10-CM | POA: Diagnosis not present

## 2017-02-12 DIAGNOSIS — I10 Essential (primary) hypertension: Secondary | ICD-10-CM | POA: Diagnosis not present

## 2017-02-12 DIAGNOSIS — M79672 Pain in left foot: Secondary | ICD-10-CM | POA: Diagnosis not present

## 2017-02-20 ENCOUNTER — Ambulatory Visit: Payer: 59 | Admitting: Adult Health

## 2017-02-20 ENCOUNTER — Encounter: Payer: Self-pay | Admitting: Adult Health

## 2017-02-20 VITALS — BP 118/78 | HR 85 | Wt 151.6 lb

## 2017-02-20 DIAGNOSIS — R569 Unspecified convulsions: Secondary | ICD-10-CM | POA: Diagnosis not present

## 2017-02-20 NOTE — Progress Notes (Signed)
I have read the note, and I agree with the clinical assessment and plan.  Latricia Cerrito KEITH   

## 2017-02-20 NOTE — Patient Instructions (Signed)
Your Plan:  Continue Keppra 250 mg twice a day If your symptoms worsen or you develop new symptoms please let us know.    Thank you for coming to see [place] at [facility]. I hope we have been able to provide you high quality care today.  You may receive a patient satisfaction survey over the next few weeks. We would appreciate your feedback and comments so that we may continue to improve ourselves and the health of our patients.

## 2017-02-20 NOTE — Progress Notes (Signed)
PATIENT: Demaya Hardge DOB: May 03, 1965  REASON FOR VISIT: follow up HISTORY FROM: patient  HISTORY OF PRESENT ILLNESS: Today 02/20/17 Ms. Holik is a 51 year old female with a history of seizures.  She returns today for follow-up.  At the last visit she was restarted on Keppra 250 mg twice a day.  She reports that she has been tolerating this well.  She denies any significant fatigue or drowsiness.  She denies any seizure events.  She is now operating a motor vehicle without difficulty.  She is able to complete all ADLs independently. she denies any new symptoms.  She returns today for follow-up.  HISTORY  11/16/16: Ms. Graciano is a 50 year old female with a history of seizures. She returns today for follow-up. At the last visit she was started on Topamax however she was unable to tolerate this. She was then placed on Zonegran. She is also unable to tolerate this. She was put back on Keppra 500 mg twice a day. She states that Her made her feel very fatigued therefore she did not take his medication very long. She states that she would rather not take daily medication. She feels that her seizure in April was due to her low potassium levels. She states that she did not take her potassium pill for 3 days. She states that she rarely drives but she does drive if she does not have a ride. She is able to complete all ADLs independently. She denies any new neurological symptoms. She denies any seizure events. She returns today for an evaluation.  REVIEW OF SYSTEMS: Out of a complete 14 system review of symptoms, the patient complains only of the following symptoms, and all other reviewed systems are negative.  See HPI  ALLERGIES: Allergies  Allergen Reactions  . Topamax [Topiramate]     Nausea and vomiting  . Contrast Media [Iodinated Diagnostic Agents] Rash  . Iohexol Nausea And Vomiting    Pt. Vomited immediately after injection of Omni 350.    . Vicodin [Hydrocodone-Acetaminophen] Rash    HOME  MEDICATIONS: Outpatient Medications Prior to Visit  Medication Sig Dispense Refill  . COD LIVER OIL PO Take 2 tablets by mouth daily.    . hydrochlorothiazide (HYDRODIURIL) 25 MG tablet Take 0.5 tablets (12.5 mg total) by mouth daily. (Patient taking differently: Take 25 mg by mouth daily. )  0  . Iron Combinations (CHROMAGEN) capsule Take 1 capsule by mouth daily.    Marland Kitchen levETIRAcetam (KEPPRA) 250 MG tablet Take 1 tablet (250 mg total) by mouth 2 (two) times daily. 60 tablet 5  . levothyroxine (SYNTHROID, LEVOTHROID) 75 MCG tablet Take 1 tablet (75 mcg total) by mouth daily. 90 tablet 3  . Multiple Vitamin (MULTIVITAMIN WITH MINERALS) TABS tablet Take 1 tablet by mouth daily.    Marland Kitchen OVER THE COUNTER MEDICATION Take 1 tablet by mouth daily. Vitamin for bones    . potassium chloride SA (KLOR-CON M15) 15 MEQ tablet Take 1 tablet (15 mEq total) by mouth daily.     No facility-administered medications prior to visit.     PAST MEDICAL HISTORY: Past Medical History:  Diagnosis Date  . Asthma   . Common migraine with intractable migraine 08/08/2016  . Enlarged ovary    right CT 05/2014  . Hypertension   . Hyperthyroidism   . Seizure disorder ([place]) 08/08/2016  . Seizures ([place])   . 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 Vertigo     PAST SURGICAL HISTORY: Past Surgical History:  Procedure Laterality Date  .  ABDOMINAL HYSTERECTOMY    . OVARIAN CYST SURGERY      FAMILY HISTORY: Family History  Problem Relation Age of Onset  . Diabetes Mother   . Hypertension Mother   . Thyroid disease Mother   . Seizures Mother   . Diabetes Father   . Hypertension Father   . Thyroid disease Sister   . Thyroid disease Brother   . Seizures Brother   . Cancer Maternal Aunt        ovarian    SOCIAL HISTORY: Social History   Socioeconomic History  . Marital status: Married    Spouse name: Not on file  . Number of children: 2  . Years of education: HS  . Highest education level: Not on file  Social Needs  .  Financial resource strain: Not on file  . Food insecurity - worry: Not on file  . Food insecurity - inability: Not on file  . Transportation needs - medical: Not on file  . Transportation needs - non-medical: Not on file  Occupational History  . Not on file  Tobacco Use  . Smoking status: Never Smoker  . Smokeless tobacco: Never Used  Substance and Sexual Activity  . Alcohol use: No  . Drug use: No  . Sexual activity: Yes    Birth control/protection: Surgical  Other Topics Concern  . Not on file  Social History Narrative   Caffeine: 1-2 times a week       PHYSICAL EXAM  Vitals:   02/20/17 0923  BP: 118/78  Pulse: 85  Weight: 151 lb 9.6 oz (68.8 kg)   Body mass index is 26.02 kg/m.  Generalized: Well developed, in no acute distress   Neurological examination  Mentation: Alert oriented to time, place, history taking. Follows all commands speech and language fluent Cranial nerve II-XII: Pupils were equal round reactive to light. Extraocular movements were full, visual field were full on confrontational test. Facial sensation and strength were normal. Uvula tongue midline. Head turning and shoulder shrug  were normal and symmetric. Motor: The motor testing reveals 5 over 5 strength of all 4 extremities. Good symmetric motor tone is noted throughout.  Sensory: Sensory testing is intact to soft touch on all 4 extremities. No evidence of extinction is noted.  Coordination: Cerebellar testing reveals good finger-nose-finger and heel-to-shin bilaterally.  Gait and station: Gait is normal. Tandem gait is normal. Romberg is negative. No drift is seen.  Reflexes: Deep tendon reflexes are symmetric and normal bilaterally.   DIAGNOSTIC DATA (LABS, IMAGING, TESTING) - I reviewed patient records, labs, notes, testing and imaging myself where available.  Lab Results  Component Value Date   WBC 6.6 07/27/2016   HGB 14.6 07/27/2016   HCT 43.8 07/27/2016   MCV 88.7 07/27/2016   PLT  221 07/27/2016      Component Value Date/Time   NA 140 07/27/2016 1320   K 3.0 (L) 07/27/2016 1320   CL 102 07/27/2016 1320   CO2 27 07/27/2016 1320   GLUCOSE 113 (H) 07/27/2016 1320   BUN 17 07/27/2016 1320   CREATININE 0.77 07/27/2016 1320   CALCIUM 10.2 07/27/2016 1320   CALCIUM 9.5 05/23/2014 1709   PROT 8.5 (H) 07/27/2016 1320   ALBUMIN 4.9 07/27/2016 1320   AST 23 07/27/2016 1320   ALT 15 07/27/2016 1320   ALKPHOS 96 07/27/2016 1320   BILITOT 0.6 07/27/2016 1320   GFRNONAA >60 07/27/2016 1320   GFRAA >60 07/27/2016 1320   Lab Results  Component Value  Date   CHOL 118 06/02/2014   HDL 20 (L) 06/02/2014   LDLCALC 85 06/02/2014   TRIG 63 06/02/2014   CHOLHDL 5.9 06/02/2014   Lab Results  Component Value Date   HGBA1C 5.7 09/10/2015   Lab Results  Component Value Date   VITAMINB12 396 06/10/2014   Lab Results  Component Value Date   TSH 5.03 (H) 01/25/2017      ASSESSMENT AND PLAN 51 y.o. year old female  has a past medical history of Asthma, Common migraine with intractable migraine (08/08/2016), Enlarged ovary, Hypertension, Hyperthyroidism, Seizure disorder ([place]) (08/08/2016), Seizures ([place]), Thyroid storm (06/09/2014), and Vertigo. here with:  1.  Seizures  Overall the patient is doing well.  She will continue on Keppra 250 mg twice a day.  She is advised if she has any seizure events she should let us know if she will follow-up in 6 months or sooner if needed.  I spent 15 minutes with the patient. 50% of this time was spent discussing her medication and ongoing plan of care.      Ward Givens, MSN, NP-C 02/20/2017, 9:38 AM [facility] [address], [place], [place] [address] [phone]

## 2017-04-27 ENCOUNTER — Other Ambulatory Visit (INDEPENDENT_AMBULATORY_CARE_PROVIDER_SITE_OTHER): Payer: 59

## 2017-04-27 DIAGNOSIS — E890 Postprocedural hypothyroidism: Secondary | ICD-10-CM

## 2017-04-27 LAB — TSH: TSH: 0.65 u[IU]/mL (ref 0.35–4.50)

## 2017-04-27 LAB — T4, FREE: FREE T4: 1.23 ng/dL (ref 0.60–1.60)

## 2017-04-30 DIAGNOSIS — M5441 Lumbago with sciatica, right side: Secondary | ICD-10-CM | POA: Diagnosis not present

## 2017-04-30 DIAGNOSIS — M9902 Segmental and somatic dysfunction of thoracic region: Secondary | ICD-10-CM | POA: Diagnosis not present

## 2017-04-30 DIAGNOSIS — M9903 Segmental and somatic dysfunction of lumbar region: Secondary | ICD-10-CM | POA: Diagnosis not present

## 2017-05-01 ENCOUNTER — Telehealth: Payer: Self-pay | Admitting: *Deleted

## 2017-05-01 ENCOUNTER — Ambulatory Visit: Payer: 59 | Admitting: Endocrinology

## 2017-05-01 ENCOUNTER — Encounter: Payer: Self-pay | Admitting: Endocrinology

## 2017-05-01 VITALS — BP 120/82 | HR 80 | Ht 64.0 in | Wt 146.0 lb

## 2017-05-01 DIAGNOSIS — N951 Menopausal and female climacteric states: Secondary | ICD-10-CM | POA: Diagnosis not present

## 2017-05-01 DIAGNOSIS — E890 Postprocedural hypothyroidism: Secondary | ICD-10-CM

## 2017-05-01 MED ORDER — LEVETIRACETAM 250 MG PO TABS: 250.0000 mg | ORAL_TABLET | Freq: Two times a day (BID) | ORAL | 5 refills | Status: DC

## 2017-05-01 NOTE — Progress Notes (Signed)
Patient ID: [name], female   DOB: 03/09/1966, 52 y.o.   MRN: [id number]            Reason for Appointment:  Hypothyroidism, follow-up visit    History of Present Illness:   Background information: She was treated for hyperthyroidism in 05/2014; this was diagnosed when she was admitted to the hospital with nausea, weight loss of 20-25 pounds and constipation.  She was then found to have significant tachycardia and Hyperthyroidism  was diagnosed She got 15 mCi in 06/2014 and subsequently became hypothyroid, details of when she first went on thyroid supplements are unknown. She was initially treated with 75 g of levothyroxine Subsequently been TSH was 11 this was increased to 112 g which however brought her TSH down to 0.11 She was then started on 100 g of levothyroxine In 04/2015 she had an episode of syncope along with symptoms of nausea and was evaluated in an emergency room in [place] where she was found to have a TSH of 0.15 Subsequently had been on 50 g of levothyroxine which caused her to have significant fatigue, nausea and dizziness  RECENT history: Since her TSH was 25.7 her Synthroid was increased from 50 up to 88 g in  05/2015 Subsequently when her TSH was low normal she was told to take a half tablet less per week  She has continued to require variable doses Her dose was reduced to 75 g in 07/2016  However this was increased by half a tablet weekly in 10/18 when her TSH was 5 She does not think she felt any different with increasing the dose at that time  She still feels fatigued and sluggish at times but is having issues with sleep and menopausal symptoms However she can still continue to be exercising and has lost a little more weight  More recently her TSH is quite normal at 0.65 He also does not feel jittery or have palpitations  Patient's weight history is as follows:  Wt Readings from Last 3 Encounters:  05/01/17 146 lb (66.2 kg)  02/20/17 151 lb 9.6 oz  (68.8 kg)  01/29/17 151 lb 6.4 oz (68.7 kg)    Thyroid function results have been as follows:  Lab Results  Component Value Date   TSH 0.65 04/27/2017   TSH 5.03 (H) 01/25/2017   TSH 0.42 07/21/2016   FREET4 1.23 04/27/2017   FREET4 1.04 01/25/2017   FREET4 1.35 07/21/2016    Lab Results  Component Value Date   TSH 0.65 04/27/2017   TSH 5.03 (H) 01/25/2017   TSH 0.42 07/21/2016   TSH 1.86 01/17/2016   TSH 0.96 06/10/2015   TSH 25.67 (H) 05/11/2015   TSH 0.11 (A) 03/01/2015   TSH 11.04 (H) 01/27/2015   TSH 25.71 (H) 01/13/2015     Past Medical History:  Diagnosis Date  . Asthma   . Common migraine with intractable migraine 08/08/2016  . Enlarged ovary    right CT 05/2014  . Hypertension   . Hyperthyroidism   . Seizure disorder ([place]) 08/08/2016  . Seizures ([place])   . 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 Vertigo     Past Surgical History:  Procedure Laterality Date  . ABDOMINAL HYSTERECTOMY    . BALLOON DILATION N/A 05/26/2014   Procedure: BALLOON DILATION OF THE PYLORUS;  Surgeon: Danie Binder, MD;  [facility];  Service: Endoscopy;  Laterality: N/A;  . ESOPHAGOGASTRODUODENOSCOPY N/A 05/26/2014   Procedure: ESOPHAGOGASTRODUODENOSCOPY (EGD);  Surgeon: Danie Binder, MD;  [facility];  Service: Endoscopy;  Laterality: N/A;  . OVARIAN CYST SURGERY      Family History  Problem Relation Age of Onset  . Diabetes Mother   . Hypertension Mother   . Thyroid disease Mother   . Seizures Mother   . Diabetes Father   . Hypertension Father   . Thyroid disease Sister   . Thyroid disease Brother   . Seizures Brother   . Cancer Maternal Aunt        ovarian    Social History:  reports that  has never smoked. she has never used smokeless tobacco. She reports that she does not drink alcohol or use drugs.  Allergies:  Allergies  Allergen Reactions  . Topamax [Topiramate]     Nausea and vomiting  . Contrast Media [Iodinated Diagnostic Agents] Rash  .  Iohexol Nausea And Vomiting    Pt. Vomited immediately after injection of Omni 350.    . Vicodin [Hydrocodone-Acetaminophen] Rash    Allergies as of 05/01/2017      Reactions   Topamax [topiramate]    Nausea and vomiting   Contrast Media [iodinated Diagnostic Agents] Rash   Iohexol Nausea And Vomiting   Pt. Vomited immediately after injection of Omni 350.     Vicodin [hydrocodone-acetaminophen] Rash      Medication List        Accurate as of 05/01/17  5:07 PM. Always use your most recent med list.          chromagen capsule Take 1 capsule by mouth daily.   COD LIVER OIL PO Take 2 tablets by mouth daily.   hydrochlorothiazide 25 MG tablet Commonly known as:  HYDRODIURIL Take 0.5 tablets (12.5 mg total) by mouth daily.   levETIRAcetam 250 MG tablet Commonly known as:  KEPPRA Take 1 tablet (250 mg total) by mouth 2 (two) times daily.   levothyroxine 75 MCG tablet Commonly known as:  SYNTHROID, LEVOTHROID Take 1 tablet (75 mcg total) by mouth daily.   multivitamin with minerals Tabs tablet Take 1 tablet by mouth daily.   OVER THE COUNTER MEDICATION Take 1 tablet by mouth daily. Vitamin for bones   potassium chloride SA 15 MEQ tablet Commonly known as:  KLOR-CON M15 Take 1 tablet (15 mEq total) by mouth daily.          Review of Systems    She has long-standing history of seizures, on treatment  She is still having significant hot flashes and difficulty sleeping She was offered an oral estrogen tablet by her gynecologist that she refused because of potential side effects             Examination:    BP 120/82   Pulse 80   Ht 5\' 4"  (1.626 m)   Wt 146 lb (66.2 kg)   SpO2 99%   BMI 25.06 kg/m   She looks well Biceps reflexes normal No tremor   Assessment:  HYPOTHYROIDISM, secondary to radioactive iodine treatment Graves' disease in early 2016 She has been on variable doses of levothyroxine Some of this may be related to her weight changes but  also because of using generic  She is doing subjectively fairly well now despite minor fluctuation in her TSH levels in 2018 She is now quite regular with her supplement Currently TSH is 0.65  MENOPAUSAL symptoms: She is having significant difficulties and not able to cope with this She is having sleep problems, hot flashes and this causes her to have fatigue  Discussed with the patient that she can benefit from short-term HRT and if she uses transdermal preparation this is less likely cause potential side effects and would be more convenient also She will discuss this with her gynecologist   PLAN:   Continue the dose of Synthroid 75 g, 7-1/2 tablets weekly as before  Follow-up in 6 months    Rusell Meneely 05/01/2017, 5:07 PM

## 2017-05-01 NOTE — Telephone Encounter (Signed)
Keppra refilled x 6 months.

## 2017-06-14 ENCOUNTER — Telehealth: Payer: Self-pay | Admitting: *Deleted

## 2017-06-14 NOTE — Telephone Encounter (Addendum)
[facility] [place] form faxed to [phone] on 03/14/119.

## 2017-06-15 DIAGNOSIS — Z0289 Encounter for other administrative examinations: Secondary | ICD-10-CM

## 2017-06-20 NOTE — Telephone Encounter (Signed)
[employer] forms completed, signed by Edman Circle, NP. Sent to medical records for processing.

## 2017-07-16 DIAGNOSIS — M9903 Segmental and somatic dysfunction of lumbar region: Secondary | ICD-10-CM | POA: Diagnosis not present

## 2017-07-16 DIAGNOSIS — M9905 Segmental and somatic dysfunction of pelvic region: Secondary | ICD-10-CM | POA: Diagnosis not present

## 2017-07-16 DIAGNOSIS — M5441 Lumbago with sciatica, right side: Secondary | ICD-10-CM | POA: Diagnosis not present

## 2017-07-25 ENCOUNTER — Other Ambulatory Visit: Payer: Self-pay | Admitting: Endocrinology

## 2017-07-31 ENCOUNTER — Other Ambulatory Visit: Payer: Self-pay | Admitting: Family Medicine

## 2017-07-31 DIAGNOSIS — Z1231 Encounter for screening mammogram for malignant neoplasm of breast: Secondary | ICD-10-CM

## 2017-08-20 ENCOUNTER — Encounter: Payer: Self-pay | Admitting: Neurology

## 2017-08-20 ENCOUNTER — Ambulatory Visit: Payer: 59 | Admitting: Neurology

## 2017-08-20 VITALS — BP 125/88 | HR 88 | Wt 147.5 lb

## 2017-08-20 DIAGNOSIS — G40909 Epilepsy, unspecified, not intractable, without status epilepticus: Secondary | ICD-10-CM | POA: Diagnosis not present

## 2017-08-20 MED ORDER — LEVETIRACETAM 250 MG PO TABS: 250.0000 mg | ORAL_TABLET | Freq: Two times a day (BID) | ORAL | 3 refills | Status: DC

## 2017-08-20 NOTE — Progress Notes (Signed)
Reason for visit: Seizures  [name] is an 52 y.o. female  History of present illness:  [name] is a 52 year old right-handed black female with a history of seizures that have occurred over the last 10 years or more.  The patient has been placed on Topamax, Zonegran, and Keppra, she has tolerated only low-dose Keppra taking 250 mg twice daily.  The patient is back to operate a motor vehicle, she has not had any seizures since last seen.  She returns for further evaluation.  She denies any new medical issues that have come up since last seen.  Past Medical History:  Diagnosis Date  . Asthma   . Common migraine with intractable migraine 08/08/2016  . Enlarged ovary    right CT 05/2014  . Hypertension   . Hyperthyroidism   . Seizure disorder ([place]) 08/08/2016  . Seizures ([place])   . 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 Vertigo     Past Surgical History:  Procedure Laterality Date  . ABDOMINAL HYSTERECTOMY    . BALLOON DILATION N/A 05/26/2014   Procedure: BALLOON DILATION OF THE PYLORUS;  Surgeon: Danie Binder, MD;  [facility];  Service: Endoscopy;  Laterality: N/A;  . ESOPHAGOGASTRODUODENOSCOPY N/A 05/26/2014   Procedure: ESOPHAGOGASTRODUODENOSCOPY (EGD);  Surgeon: Danie Binder, MD;  [facility]: [facility];  Service: Endoscopy;  Laterality: N/A;  . OVARIAN CYST SURGERY      Family History  Problem Relation Age of Onset  . Diabetes Mother   . Hypertension Mother   . Thyroid disease Mother   . Seizures Mother   . Diabetes Father   . Hypertension Father   . Thyroid disease Sister   . Thyroid disease Brother   . Seizures Brother   . Cancer Maternal Aunt        ovarian    Social history:  reports that she has never smoked. She has never used smokeless tobacco. She reports that she does not drink alcohol or use drugs.    Allergies  Allergen Reactions  . Topamax [Topiramate]     Nausea and vomiting  . Contrast Media [Iodinated Diagnostic Agents] Rash  . Iohexol  Nausea And Vomiting    Pt. Vomited immediately after injection of Omni 350.    . Vicodin [Hydrocodone-Acetaminophen] Rash    Medications:  Prior to Admission medications   Medication Sig Start Date End Date Taking? Authorizing Provider  COD LIVER OIL PO Take 2 tablets by mouth daily.   Yes [provider]  hydrochlorothiazide (HYDRODIURIL) 25 MG tablet Take 0.5 tablets (12.5 mg total) by mouth daily. Patient taking differently: Take 25 mg by mouth daily.  06/15/14  Yes Rexene Alberts, MD  Iron Combinations (CHROMAGEN) capsule Take 1 capsule by mouth daily.   Yes [provider]  levETIRAcetam (KEPPRA) 250 MG tablet Take 1 tablet (250 mg total) by mouth 2 (two) times daily. 08/20/17  Yes Kathrynn Ducking, MD  levothyroxine (SYNTHROID, LEVOTHROID) 75 MCG tablet TAKE 1 TABLET BY MOUTH EVERY DAY 07/25/17  Yes Elayne Snare, MD  Multiple Vitamin (MULTIVITAMIN WITH MINERALS) TABS tablet Take 1 tablet by mouth daily.   Yes [provider]  OVER THE COUNTER MEDICATION Take 1 tablet by mouth daily. Vitamin for bones   Yes [provider]  potassium chloride SA (KLOR-CON M15) 15 MEQ tablet Take 1 tablet (15 mEq total) by mouth daily. 08/08/16  Yes Kathrynn Ducking, MD    ROS:  Out of a complete 14 system  review of symptoms, the patient complains only of the following symptoms, and all other reviewed systems are negative.  Seizures  Blood pressure 125/88, pulse 88, weight 147 lb 8 oz (66.9 kg).  Physical Exam  General: The patient is alert and cooperative at the time of the examination.  Skin: No significant peripheral edema is noted.   Neurologic Exam  Mental status: The patient is alert and oriented x 3 at the time of the examination. The patient has apparent normal recent and remote memory, with an apparently normal attention span and concentration ability.   Cranial nerves: Facial symmetry is present. Speech is normal, no aphasia or dysarthria is noted.  Extraocular movements are full. Visual fields are full.  Motor: The patient has good strength in all 4 extremities.  Sensory examination: Soft touch sensation is symmetric on the face, arms, and legs.  Coordination: The patient has good finger-nose-finger and heel-to-shin bilaterally.  Gait and station: The patient has a normal gait. Tandem gait is normal. Romberg is negative. No drift is seen.  Reflexes: Deep tendon reflexes are symmetric.   Assessment/Plan:  1.  History of seizures  The patient will remain on low-dose Keppra taking 250 mg twice daily.  If she has any further events she is to contact our office, a prescription was sent in.  She will follow-up in 1 year.  Jill Alexanders MD 08/20/2017 11:38 AM  [facility] [address] [place], [place] [address]  Phone [phone] Fax [phone]

## 2017-09-04 DIAGNOSIS — I10 Essential (primary) hypertension: Secondary | ICD-10-CM | POA: Diagnosis not present

## 2017-09-04 DIAGNOSIS — Z0000 Encounter for general adult medical examination without abnormal findings: Secondary | ICD-10-CM | POA: Diagnosis not present

## 2017-09-04 DIAGNOSIS — E7800 Pure hypercholesterolemia, unspecified: Secondary | ICD-10-CM | POA: Diagnosis not present

## 2017-10-23 DIAGNOSIS — M9903 Segmental and somatic dysfunction of lumbar region: Secondary | ICD-10-CM | POA: Diagnosis not present

## 2017-10-23 DIAGNOSIS — M5441 Lumbago with sciatica, right side: Secondary | ICD-10-CM | POA: Diagnosis not present

## 2017-10-23 DIAGNOSIS — M9902 Segmental and somatic dysfunction of thoracic region: Secondary | ICD-10-CM | POA: Diagnosis not present

## 2017-10-26 ENCOUNTER — Other Ambulatory Visit (INDEPENDENT_AMBULATORY_CARE_PROVIDER_SITE_OTHER): Payer: 59

## 2017-10-26 DIAGNOSIS — E890 Postprocedural hypothyroidism: Secondary | ICD-10-CM

## 2017-10-26 LAB — T4, FREE: Free T4: 1.29 ng/dL (ref 0.60–1.60)

## 2017-10-26 LAB — TSH: TSH: 0.49 u[IU]/mL (ref 0.35–4.50)

## 2017-10-28 NOTE — Progress Notes (Signed)
Patient ID: [name], female   DOB: 1965/05/04, 52 y.o.   MRN: [id number]            Reason for Appointment:  Hypothyroidism, follow-up visit    History of Present Illness:   Background information: She was treated for hyperthyroidism in 05/2014; this was diagnosed when she was admitted to the hospital with nausea, weight loss of 20-25 pounds and constipation.  She was then found to have significant tachycardia and Hyperthyroidism  was diagnosed She got 15 mCi in 06/2014 and subsequently became hypothyroid, details of when she first went on thyroid supplements are unknown. She was initially treated with 75 g of levothyroxine Subsequently been TSH was 11 this was increased to 112 g which however brought her TSH down to 0.11 She was then started on 100 g of levothyroxine In 04/2015 she had an episode of syncope along with symptoms of nausea and was evaluated in an emergency room in [place] where she was found to have a TSH of 0.15 Subsequently had been on 50 g of levothyroxine which caused her to have significant fatigue, nausea and dizziness  RECENT history: Previously her TSH was 25.7 her Synthroid was increased from 50 up to 88 g in  05/2015 Subsequently when her TSH was low normal she was told to take a half tablet less per week Her dose was reduced to 75 g in 07/2016  Levothyroxine was increased by half a tablet weekly in 10/18 when her TSH was 5 She continues to take 7 and half tablets a week TSH is now appearing to be low normal  She feels fairly good with no unusual fatigue or weight change She is very consistent with taking her levothyroxine in the mornings  Patient's weight history is as follows:  Wt Readings from Last 3 Encounters:  10/29/17 150 lb 9.6 oz (68.3 kg)  08/20/17 147 lb 8 oz (66.9 kg)  05/01/17 146 lb (66.2 kg)    Thyroid function results have been as follows:  Lab Results  Component Value Date   TSH 0.49 10/26/2017   TSH 0.65 04/27/2017   TSH  5.03 (H) 01/25/2017   FREET4 1.29 10/26/2017   FREET4 1.23 04/27/2017   FREET4 1.04 01/25/2017    Lab Results  Component Value Date   TSH 0.49 10/26/2017   TSH 0.65 04/27/2017   TSH 5.03 (H) 01/25/2017   TSH 0.42 07/21/2016   TSH 1.86 01/17/2016   TSH 0.96 06/10/2015   TSH 25.67 (H) 05/11/2015   TSH 0.11 (A) 03/01/2015   TSH 11.04 (H) 01/27/2015     Past Medical History:  Diagnosis Date  . Asthma   . Common migraine with intractable migraine 08/08/2016  . Enlarged ovary    right CT 05/2014  . Hypertension   . Hyperthyroidism   . Seizure disorder ([place]) 08/08/2016  . Seizures ([place])   . 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 Vertigo     Past Surgical History:  Procedure Laterality Date  . ABDOMINAL HYSTERECTOMY    . BALLOON DILATION N/A 05/26/2014   Procedure: BALLOON DILATION OF THE PYLORUS;  Surgeon: Danie Binder, MD;  [facility];  Service: Endoscopy;  Laterality: N/A;  . ESOPHAGOGASTRODUODENOSCOPY N/A 05/26/2014   Procedure: ESOPHAGOGASTRODUODENOSCOPY (EGD);  Surgeon: Danie Binder, MD;  [facility];  Service: Endoscopy;  Laterality: N/A;  . OVARIAN CYST SURGERY      Family History  Problem Relation Age of Onset  . Diabetes Mother   . Hypertension  Mother   . Thyroid disease Mother   . Seizures Mother   . Diabetes Father   . Hypertension Father   . Thyroid disease Sister   . Thyroid disease Brother   . Seizures Brother   . Cancer Maternal Aunt        ovarian    Social History:  reports that she has never smoked. She has never used smokeless tobacco. She reports that she does not drink alcohol or use drugs.  Allergies:  Allergies  Allergen Reactions  . Topamax [Topiramate]     Nausea and vomiting  . Contrast Media [Iodinated Diagnostic Agents] Rash  . Iohexol Nausea And Vomiting    Pt. Vomited immediately after injection of Omni 350.    . Vicodin [Hydrocodone-Acetaminophen] Rash    Allergies as of 10/29/2017      Reactions   Topamax  [topiramate]    Nausea and vomiting   Contrast Media [iodinated Diagnostic Agents] Rash   Iohexol Nausea And Vomiting   Pt. Vomited immediately after injection of Omni 350.     Vicodin [hydrocodone-acetaminophen] Rash      Medication List        Accurate as of 10/29/17 10:51 AM. Always use your most recent med list.          chromagen capsule Take 1 capsule by mouth daily.   COD LIVER OIL PO Take 2 tablets by mouth daily.   hydrochlorothiazide 25 MG tablet Commonly known as:  HYDRODIURIL Take 0.5 tablets (12.5 mg total) by mouth daily.   levETIRAcetam 250 MG tablet Commonly known as:  KEPPRA Take 1 tablet (250 mg total) by mouth 2 (two) times daily.   levothyroxine 75 MCG tablet Commonly known as:  SYNTHROID, LEVOTHROID TAKE 1 TABLET BY MOUTH EVERY DAY   multivitamin with minerals Tabs tablet Take 1 tablet by mouth daily.   OVER THE COUNTER MEDICATION Take 1 tablet by mouth daily. Vitamin for bones   potassium chloride SA 15 MEQ tablet Commonly known as:  KLOR-CON M15 Take 1 tablet (15 mEq total) by mouth daily.          Review of Systems    She has long-standing history of seizures, on treatment  Also has history of hypertension treated with HydroDIURIL  She is still having significant hot flashes and difficulty sleeping, has not been treated with HRT despite her hysterectomy              Examination:    BP 120/82 (BP Location: Left Arm, Patient Position: Sitting, Cuff Size: Normal)   Pulse 76   Ht 5\' 4"  (1.626 m)   Wt 150 lb 9.6 oz (68.3 kg)   SpO2 97%   BMI 25.85 kg/m   She looks well Thyroid not palpable Biceps reflexes normal No tremor   Assessment:  HYPOTHYROIDISM, secondary to radioactive iodine treatment Graves' disease in early 2016 She has been on variable doses of levothyroxine in the past  She is doing subjectively fairly well now despite minor fluctuation in her TSH levels since 2018 She has usually taking her levothyroxine  very consistently as directed  She is doing subjectively well and TSH is still below 1.0, this is with taking Synthroid 7-1/2 tablets/week  MENOPAUSAL symptoms: She is having significant difficulties and may benefit from HRT   PLAN:   Since her TSH is staying consistently low normal she will not take the extra half tablet once a week  She can discuss either taking HRT with her gynecologist or  clonidine for hot flashes with her PCP    Follow-up in 6 months    Kassim Guertin 10/29/2017, 10:51 AM

## 2017-10-29 ENCOUNTER — Encounter: Payer: Self-pay | Admitting: Endocrinology

## 2017-10-29 ENCOUNTER — Ambulatory Visit: Payer: 59 | Admitting: Endocrinology

## 2017-10-29 VITALS — BP 120/82 | HR 76 | Ht 64.0 in | Wt 150.6 lb

## 2017-10-29 DIAGNOSIS — N951 Menopausal and female climacteric states: Secondary | ICD-10-CM

## 2017-10-29 DIAGNOSIS — E890 Postprocedural hypothyroidism: Secondary | ICD-10-CM | POA: Diagnosis not present

## 2017-10-29 NOTE — Patient Instructions (Addendum)
Take 1 pill daily  Ask about clonidine patch

## 2017-11-27 DIAGNOSIS — Z1211 Encounter for screening for malignant neoplasm of colon: Secondary | ICD-10-CM | POA: Diagnosis not present

## 2017-12-17 ENCOUNTER — Ambulatory Visit: Payer: 59

## 2017-12-17 ENCOUNTER — Ambulatory Visit
Admission: RE | Admit: 2017-12-17 | Discharge: 2017-12-17 | Disposition: A | Discharge status: Home / Self Care | Payer: 59 | Source: Ambulatory Visit | Attending: Family Medicine | Admitting: Family Medicine

## 2017-12-17 DIAGNOSIS — Z1231 Encounter for screening mammogram for malignant neoplasm of breast: Secondary | ICD-10-CM | POA: Diagnosis not present

## 2017-12-17 DIAGNOSIS — N951 Menopausal and female climacteric states: Secondary | ICD-10-CM | POA: Diagnosis not present

## 2018-01-03 ENCOUNTER — Other Ambulatory Visit: Payer: Self-pay | Admitting: Endocrinology

## 2018-01-15 DIAGNOSIS — M5441 Lumbago with sciatica, right side: Secondary | ICD-10-CM | POA: Diagnosis not present

## 2018-01-15 DIAGNOSIS — M9903 Segmental and somatic dysfunction of lumbar region: Secondary | ICD-10-CM | POA: Diagnosis not present

## 2018-01-15 DIAGNOSIS — M9905 Segmental and somatic dysfunction of pelvic region: Secondary | ICD-10-CM | POA: Diagnosis not present

## 2018-02-18 ENCOUNTER — Observation Stay (HOSPITAL_COMMUNITY)
Admission: EM | Admit: 2018-02-18 | Discharge: 2018-02-20 | Disposition: A | Discharge status: Home / Self Care | Payer: 59 | Attending: Family Medicine | Admitting: Family Medicine

## 2018-02-18 ENCOUNTER — Emergency Department (HOSPITAL_COMMUNITY): Payer: 59

## 2018-02-18 ENCOUNTER — Other Ambulatory Visit: Payer: Self-pay

## 2018-02-18 ENCOUNTER — Encounter (HOSPITAL_COMMUNITY): Payer: Self-pay | Admitting: *Deleted

## 2018-02-18 DIAGNOSIS — G43109 Migraine with aura, not intractable, without status migrainosus: Secondary | ICD-10-CM | POA: Diagnosis present

## 2018-02-18 DIAGNOSIS — G40909 Epilepsy, unspecified, not intractable, without status epilepticus: Secondary | ICD-10-CM

## 2018-02-18 DIAGNOSIS — G4459 Other complicated headache syndrome: Secondary | ICD-10-CM | POA: Diagnosis not present

## 2018-02-18 DIAGNOSIS — E876 Hypokalemia: Secondary | ICD-10-CM | POA: Diagnosis present

## 2018-02-18 DIAGNOSIS — J45909 Unspecified asthma, uncomplicated: Secondary | ICD-10-CM | POA: Diagnosis not present

## 2018-02-18 DIAGNOSIS — E039 Hypothyroidism, unspecified: Secondary | ICD-10-CM | POA: Insufficient documentation

## 2018-02-18 DIAGNOSIS — Z79899 Other long term (current) drug therapy: Secondary | ICD-10-CM | POA: Insufficient documentation

## 2018-02-18 DIAGNOSIS — R2990 Unspecified symptoms and signs involving the nervous system: Secondary | ICD-10-CM | POA: Diagnosis present

## 2018-02-18 DIAGNOSIS — I639 Cerebral infarction, unspecified: Secondary | ICD-10-CM

## 2018-02-18 DIAGNOSIS — I10 Essential (primary) hypertension: Secondary | ICD-10-CM | POA: Diagnosis present

## 2018-02-18 DIAGNOSIS — R29810 Facial weakness: Secondary | ICD-10-CM | POA: Diagnosis not present

## 2018-02-18 DIAGNOSIS — R4702 Dysphasia: Principal | ICD-10-CM | POA: Insufficient documentation

## 2018-02-18 DIAGNOSIS — R531 Weakness: Secondary | ICD-10-CM | POA: Diagnosis not present

## 2018-02-18 DIAGNOSIS — E890 Postprocedural hypothyroidism: Secondary | ICD-10-CM | POA: Diagnosis present

## 2018-02-18 DIAGNOSIS — R209 Unspecified disturbances of skin sensation: Secondary | ICD-10-CM

## 2018-02-18 DIAGNOSIS — R4182 Altered mental status, unspecified: Secondary | ICD-10-CM | POA: Diagnosis present

## 2018-02-18 DIAGNOSIS — R51 Headache: Secondary | ICD-10-CM | POA: Diagnosis not present

## 2018-02-18 DIAGNOSIS — R29818 Other symptoms and signs involving the nervous system: Secondary | ICD-10-CM | POA: Diagnosis not present

## 2018-02-18 DIAGNOSIS — G43019 Migraine without aura, intractable, without status migrainosus: Secondary | ICD-10-CM | POA: Diagnosis present

## 2018-02-18 DIAGNOSIS — R471 Dysarthria and anarthria: Secondary | ICD-10-CM

## 2018-02-18 LAB — URINALYSIS, ROUTINE W REFLEX MICROSCOPIC
BILIRUBIN URINE: NEGATIVE
Bacteria, UA: NONE SEEN
GLUCOSE, UA: NEGATIVE mg/dL
Ketones, ur: NEGATIVE mg/dL
LEUKOCYTES UA: NEGATIVE
Nitrite: NEGATIVE
PROTEIN: NEGATIVE mg/dL
SPECIFIC GRAVITY, URINE: 1.024 (ref 1.005–1.030)
pH: 5 (ref 5.0–8.0)

## 2018-02-18 LAB — CBC
HEMATOCRIT: 38 % (ref 36.0–46.0)
Hemoglobin: 12.1 g/dL (ref 12.0–15.0)
MCH: 28.9 pg (ref 26.0–34.0)
MCHC: 31.8 g/dL (ref 30.0–36.0)
MCV: 90.9 fL (ref 80.0–100.0)
NRBC: 0 % (ref 0.0–0.2)
Platelets: 182 10*3/uL (ref 150–400)
RBC: 4.18 MIL/uL (ref 3.87–5.11)
RDW: 12.6 % (ref 11.5–15.5)
WBC: 9.3 10*3/uL (ref 4.0–10.5)

## 2018-02-18 LAB — TROPONIN I

## 2018-02-18 LAB — DIFFERENTIAL
ABS IMMATURE GRANULOCYTES: 0.03 10*3/uL (ref 0.00–0.07)
Basophils Absolute: 0 10*3/uL (ref 0.0–0.1)
Basophils Relative: 0 %
Eosinophils Absolute: 0 10*3/uL (ref 0.0–0.5)
Eosinophils Relative: 0 %
IMMATURE GRANULOCYTES: 0 %
LYMPHS ABS: 1.4 10*3/uL (ref 0.7–4.0)
LYMPHS PCT: 15 %
MONOS PCT: 6 %
Monocytes Absolute: 0.6 10*3/uL (ref 0.1–1.0)
Neutro Abs: 7.3 10*3/uL (ref 1.7–7.7)
Neutrophils Relative %: 79 %

## 2018-02-18 LAB — RAPID URINE DRUG SCREEN, HOSP PERFORMED
AMPHETAMINES: NOT DETECTED
Barbiturates: NOT DETECTED
Benzodiazepines: NOT DETECTED
Cocaine: NOT DETECTED
Opiates: NOT DETECTED
Tetrahydrocannabinol: NOT DETECTED

## 2018-02-18 LAB — COMPREHENSIVE METABOLIC PANEL
ALBUMIN: 4 g/dL (ref 3.5–5.0)
ALK PHOS: 67 U/L (ref 38–126)
ALT: 10 U/L (ref 0–44)
AST: 20 U/L (ref 15–41)
Anion gap: 7 (ref 5–15)
BILIRUBIN TOTAL: 0.6 mg/dL (ref 0.3–1.2)
BUN: 12 mg/dL (ref 6–20)
CALCIUM: 8.9 mg/dL (ref 8.9–10.3)
CO2: 24 mmol/L (ref 22–32)
CREATININE: 0.72 mg/dL (ref 0.44–1.00)
Chloride: 108 mmol/L (ref 98–111)
GFR calc Af Amer: >60 mL/min (ref >60)
GLUCOSE: 124 mg/dL — AB (ref 70–99)
POTASSIUM: 2.7 mmol/L — AB (ref 3.5–5.1)
Sodium: 139 mmol/L (ref 135–145)
TOTAL PROTEIN: 7.1 g/dL (ref 6.5–8.1)

## 2018-02-18 LAB — ETHANOL: Alcohol, Ethyl (B): <10 mg/dL (ref <10)

## 2018-02-18 LAB — PROTIME-INR
INR: 0.99
Prothrombin Time: 13 seconds (ref 11.4–15.2)

## 2018-02-18 LAB — HCG, QUANTITATIVE, PREGNANCY: HCG, BETA CHAIN, QUANT, S: 2 m[IU]/mL (ref <5)

## 2018-02-18 LAB — APTT: aPTT: 31 seconds (ref 24–36)

## 2018-02-18 MED ORDER — GADOBUTROL 1 MMOL/ML IV SOLN
7.0000 mL | Freq: Once | INTRAVENOUS | Status: AC | PRN
  Administered 2018-02-18: 7 mL via INTRAVENOUS

## 2018-02-18 MED ORDER — DEXAMETHASONE SODIUM PHOSPHATE 10 MG/ML IJ SOLN
10.0000 mg | Freq: Once | INTRAMUSCULAR | Status: DC
  Filled 2018-02-18: qty 1

## 2018-02-18 MED ORDER — SODIUM CHLORIDE 0.9 % IV BOLUS
1000.0000 mL | Freq: Once | INTRAVENOUS | Status: AC
  Administered 2018-02-18: 1000 mL via INTRAVENOUS

## 2018-02-18 MED ORDER — DEXAMETHASONE SODIUM PHOSPHATE 10 MG/ML IJ SOLN
10.0000 mg | Freq: Once | INTRAMUSCULAR | Status: AC
  Administered 2018-02-18: 10 mg via INTRAVENOUS

## 2018-02-18 MED ORDER — LEVOTHYROXINE SODIUM 75 MCG PO TABS: 75.0000 ug | ORAL_TABLET | Freq: Every day | ORAL | Status: DC

## 2018-02-18 MED ORDER — ADULT MULTIVITAMIN W/MINERALS CH
1.0000 | ORAL_TABLET | Freq: Every day | ORAL | Status: DC
  Administered 2018-02-19 – 2018-02-20 (×2): 1 via ORAL
  Filled 2018-02-18 (×2): qty 1

## 2018-02-18 MED ORDER — POTASSIUM CHLORIDE 10 MEQ/100ML IV SOLN
10.0000 meq | INTRAVENOUS | Status: AC
  Administered 2018-02-18 (×4): 10 meq via INTRAVENOUS
  Filled 2018-02-18 (×4): qty 100

## 2018-02-18 MED ORDER — METOCLOPRAMIDE HCL 5 MG/ML IJ SOLN
10.0000 mg | Freq: Once | INTRAMUSCULAR | Status: AC
  Administered 2018-02-18: 10 mg via INTRAVENOUS

## 2018-02-18 MED ORDER — POTASSIUM CHLORIDE CRYS ER 10 MEQ PO TBCR: 15.0000 meq | EXTENDED_RELEASE_TABLET | Freq: Every day | ORAL | Status: DC

## 2018-02-18 MED ORDER — DIPHENHYDRAMINE HCL 50 MG/ML IJ SOLN
25.0000 mg | Freq: Once | INTRAMUSCULAR | Status: AC
  Administered 2018-02-18: 25 mg via INTRAVENOUS

## 2018-02-18 MED ORDER — COD LIVER OIL PO CAPS: ORAL_CAPSULE | Freq: Every day | ORAL | Status: DC

## 2018-02-18 MED ORDER — METOCLOPRAMIDE HCL 5 MG/ML IJ SOLN
10.0000 mg | Freq: Once | INTRAMUSCULAR | Status: DC
  Filled 2018-02-18: qty 2

## 2018-02-18 MED ORDER — DIPHENHYDRAMINE HCL 50 MG/ML IJ SOLN
25.0000 mg | Freq: Once | INTRAMUSCULAR | Status: DC
  Filled 2018-02-18: qty 1

## 2018-02-18 MED ORDER — DIPHENHYDRAMINE HCL 50 MG/ML IJ SOLN: 25.0000 mg | Freq: Once | INTRAMUSCULAR | Status: DC

## 2018-02-18 MED ORDER — DEXAMETHASONE SODIUM PHOSPHATE 10 MG/ML IJ SOLN: 10.0000 mg | Freq: Once | INTRAMUSCULAR | Status: DC

## 2018-02-18 MED ORDER — LEVETIRACETAM 250 MG PO TABS
250.0000 mg | ORAL_TABLET | Freq: Two times a day (BID) | ORAL | Status: DC
  Administered 2018-02-19 – 2018-02-20 (×3): 250 mg via ORAL
  Filled 2018-02-18 (×3): qty 1

## 2018-02-18 MED ORDER — HYDROCHLOROTHIAZIDE 25 MG PO TABS: 25.0000 mg | ORAL_TABLET | Freq: Every day | ORAL | Status: DC

## 2018-02-18 MED ORDER — METOCLOPRAMIDE HCL 5 MG/ML IJ SOLN: 10.0000 mg | Freq: Once | INTRAMUSCULAR | Status: DC

## 2018-02-18 MED ORDER — FE FUMARATE-B12-VIT C-FA-IFC PO CAPS
1.0000 | ORAL_CAPSULE | Freq: Every day | ORAL | Status: DC
  Administered 2018-02-19 – 2018-02-20 (×2): 1 via ORAL
  Filled 2018-02-18 (×2): qty 1

## 2018-02-18 MED ORDER — ENOXAPARIN SODIUM 40 MG/0.4ML ~~LOC~~ SOLN
40.0000 mg | SUBCUTANEOUS | Status: DC
  Administered 2018-02-18 – 2018-02-19 (×2): 40 mg via SUBCUTANEOUS
  Filled 2018-02-18 (×2): qty 0.4

## 2018-02-18 MED ORDER — CHROMAGEN PO CAPS: 1.0000 | ORAL_CAPSULE | Freq: Every day | ORAL | Status: DC

## 2018-02-18 NOTE — Progress Notes (Signed)
Code stroke protocol AMS  Patient weakness, unable to speak or answer questions/follow commands  [name]  DR. Tomi Bamberger   [phone]  Arrival time 0525 Exam started 0528 Exam finished 0530 [place] [facility] called [phone]

## 2018-02-18 NOTE — Plan of Care (Signed)
Patient was oriented to room and the unit. She does not have any complaints at this time. Family is at the bedside. Care plan reviewed. Will continue to monitor patient.

## 2018-02-18 NOTE — ED Notes (Signed)
Date and time results received: 02/18/18 0721 (use smartphrase ".now" to insert current time)  Test: k+ Critical Value: 2.7  Name of Provider Notified: dr I knapp  Orders Received? Or Actions Taken?: see chart

## 2018-02-18 NOTE — ED Notes (Signed)
Unable to obtain IV access. U/S IV attempted twice by 2 nurses. Dr Tomi Bamberger made aware.

## 2018-02-18 NOTE — Consult Note (Signed)
[facility] TeleNeurology Consult Services   TeleStroke Metrics: LKW: [id number] Door Time: 0521  [facility] Contacted: [phone]  [facility] at Bedside: 0601 NIHSS: 0613 Decision on Alteplase: Not to give as her last known well time was last night. Interventional Candidate: Not a candidate as her symptoms are not consistent with a large vessel proximal occlusion.   Chief Complaint: Weakness and speech abnormalities   HPI: Asked to see this patient in emergent telemedicine consultation utilizing interactive audio and video technologies. ?Consultation was performed with assistance of ancillary / medical staff at bedside.   Verbal consent to perform the examination with telemedicine was obtained. Patient agreed to proceed with the consultation for acute stroke protocol.  52 year old right-handed African-American female who comes to the emergency room by EMS with her husband for acute neurological changes.  It was reported that the patient had a "seizure" before in the past.  Husband clarified that her "seizure" was similar to her current episode.  He remembered the patient having some focal right-sided numbness and inability to speak.  She never had any definitive convulsions.  This occurred during an admission in April 2018.  She was seen by neurology.  She had a normal EEG and negative MRI brain.  It appears she was placed on a trial of Keppra.  At some point she was also on Topamax given her history of migraine headaches.  She does report migraine headaches with associated photophobia and nausea.  She currently is not on any seizure medications or Topamax.  Patient states that she went to bed around 10 PM at her baseline.  She then woke up around 4:30 AM this morning, and she could not talk at all.  Husband states that she was generally weak and numb.  She was also dizzy.  She reports having numbness and paresthesias of her tongue, bilateral toes, and bilateral fingers.  According to the ER  attending, the patient initially had severe global weakness.  Her arms and legs would just drop to the bed.  She would also not talk.    By the time I evaluated the patient, she was able to answer my questions.  She was having no weakness in her arms.  Left leg still appeared weak.  She also was complaining of a 10/10 headache with photophobia.  However, she did look comfortable.  Husband states that the patient is improving currently.  She is at least now talking.   PMH: Possible seizure in April 2018, hypertension, asthma, thyroid disease, and migraine headache   SOC: Negative x3.  Patient is married.   [place]: Significant for diabetes mellitus, hypertension, seizures, thyroid disease   ROS: 13 point review systems were reviewed with the patient, and are all negative with the exception of the aforementioned in the history of present illness.   VS: Temperature is 97.9 F, pulse 74, respiration 20, blood pressure 140/78, oxygen saturation 100%   Exam: Patient is in no apparent distress.  Patient appears as stated age.  No obvious acute respiratory or cardiac distress.  Patient is well groomed and well-nourished. 1a- LOC: Keenly responsive - 0     1b- LOC questions: Answers both questions correctly - 0    1c- LOC commands- Performs both tasks correctly- 0    2- Gaze: Normal; no gaze paresis or gaze deviation - 0    3- Visual Fields: normal, no Visual field deficit - 0    4- Facial movements: no facial palsy - 0    5- Upper limb motor -  no drift - 0    6- Lower limb motor - left leg drift - 2     7- Limb Coordination: absent ataxia - 0     8- Sensory: no sensory loss - 0     9- Language - No aphasia - 0     10- Speech - Mild dysarthria - 1    11- Neglect / Extinction - none found - 0   NIHSS score: 3    Diagnostic Data: CT of the head showed no acute intracranial process   Medical Data Reviewed:   1.Data?reviewed include clinical labs, radiology,?and medical tests;   2.Tests?results  discussed w/performing or interpreting physician;   3.Obtaining/reviewing old medical records;   4.Obtaining?case history from another source;   5.Independent?review of image, tracing, or specimen.    Medical Decision Making:   - Extensive number of diagnosis or management options are considered below.   - Extensive amount of complex data reviewed.   - High risk of complication and/or morbidity or mortality are associated with differential diagnostic considerations below.   - There may be?uncertain?outcome and increased probability of prolonged functional impairment or high probability of severe prolonged functional impairment associated with some of these differential diagnosis.    Assessment: 1.  Altered mental status with speech arrest and generalized weakness.  Possible complicated migraine phenomenon vs Seizure vs Conversion Disorder. 2.  Possible seizure in April 2018 3.  Hypertension 4.  Asthma 5.  Thyroid disease 6.  Migraine headache   Recommendations: Patient can be given a trial of IV migraine cocktail medications per ER team. Consult local neurology team to assist with evaluation and management. Patient can be admitted to the hospital for further work-up of her symptoms. Metabolic and infectious work-up per primary team. Check MRI brain with and without contrast to rule out any acute intracranial process. Can check an EEG to rule out subclinical seizures. Continue supportive care. Plan of care was discussed with the patient and her husband.  Thank you for allowing [facility] to participate in the care of your patient. Please call me, Dr. Dale [place], with any questions at [phone]. Case discussed with the ER staff and Dr. Tomi Bamberger.   Critical Care notation:   I was called to see this critical patient emergently. I personally evaluated this critical patient for acute stroke evaluation, and determining their eligibility for IV Alteplase and interventional therapies.  I  have spent approximately 16 minutes with the patient, including time at bedside, time discussing the case with other physicians, reviewing plan of care, and time independently reviewing the records and scans.

## 2018-02-18 NOTE — ED Notes (Signed)
Pt drops both upper and lower extremities to bed without being able to hold them up for any time. Able to raise her legs when MD in room and can hold them up for a count of 5. Pt with stuttering speech but then has periods of clear speech. Pt raised arm by self (after failing to do so when asked) and scratched her face and this was witnessed by two nurses at the bedside.

## 2018-02-18 NOTE — ED Provider Notes (Signed)
[facility] EMERGENCY DEPARTMENT Provider Note   CSN: [id number] Arrival date & time: 02/18/18  9417   An emergency department physician performed an initial assessment on this suspected stroke patient at 0521.Pt was seen on arrival  History   Chief Complaint Chief Complaint  Patient presents with  . Altered Mental Status    HPI [name] is a 52 y.o. female.  HPI EMS states that patient has a history of TIAs and about 20 minutes prior to arrival she awakened her husband and was having difficulty speaking and EMS describes a right facial droop.  She went to bed at 10 PM and was normal.  She states when she woke up she felt dizzy.  She states the way she feels is different from when she had seizures.  She states she had lightheadedness.  She denies being upset.  Later on after she had a CT scan she states she has a right-sided headache and she will pick up her arm and put her hand to her right face and then her arm drops dramatically to the stretcher.  She complains of blurred vision and nausea without vomiting.  She complains of photophobia.  Husband states she woke them up because her arm was hitting him on the side.  He states she was having difficulty speaking.  Patient states she is not taking her seizure medication.  She states the last time she had a seizure was about a year ago and she states she "passes out" with her seizures.  PCP Alroy Dust, L.Marlou Sa, MD    Past Medical History:  Diagnosis Date  . Asthma   . Common migraine with intractable migraine 08/08/2016  . Enlarged ovary    right CT 05/2014  . Hypertension   . Hyperthyroidism   . Seizure disorder ([place]) 08/08/2016  . Seizures ([place])   . 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 Vertigo     Patient Active Problem List   Diagnosis Date Noted  . Seizure disorder ([place]) 08/08/2016  . Common migraine with intractable migraine 08/08/2016  . 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 Muscle cramp 01/13/2015  .  Hyperglycemia, drug-induced 06/10/2014  . Anemia due to other cause 06/10/2014  . Hypokalemia 06/10/2014  . Malnutrition of moderate degree ([place]) 06/02/2014  . Elevated d-dimer   . Altered mental status 06/01/2014  . Chest pain 06/01/2014  . Generalized weakness   . Enlarged ovary 05/28/2014  . Weight loss   . UTI (urinary tract infection) 05/26/2014  . Abdominal pain   . Fever   . Nausea with vomiting   . Nausea & vomiting 05/23/2014  . Pancreatitis 05/23/2014  . Essential hypertension 05/23/2014  . Rapid weight loss 05/23/2014  . Tachycardia 05/23/2014  . Dehydration 05/23/2014  . GERD (gastroesophageal reflux disease) 05/23/2014  . Generalized abdominal pain     Past Surgical History:  Procedure Laterality Date  . ABDOMINAL HYSTERECTOMY    . BALLOON DILATION N/A 05/26/2014   Procedure: BALLOON DILATION OF THE PYLORUS;  Surgeon: Danie Binder, MD;  Location: [facility] ENDO SUITE;  Service: Endoscopy;  Laterality: N/A;  . ESOPHAGOGASTRODUODENOSCOPY N/A 05/26/2014   Procedure: ESOPHAGOGASTRODUODENOSCOPY (EGD);  Surgeon: Danie Binder, MD;  Location: [facility] ENDO SUITE;  Service: Endoscopy;  Laterality: N/A;  . OVARIAN CYST SURGERY       OB History   None      Home Medications    Prior to Admission medications   Medication Sig Start Date End Date Taking? Authorizing Provider  COD LIVER  OIL PO Take 2 tablets by mouth daily.    [provider]  hydrochlorothiazide (HYDRODIURIL) 25 MG tablet Take 0.5 tablets (12.5 mg total) by mouth daily. Patient taking differently: Take 25 mg by mouth daily.  06/15/14   Rexene Alberts, MD  Iron Combinations (CHROMAGEN) capsule Take 1 capsule by mouth daily.    [provider]  levETIRAcetam (KEPPRA) 250 MG tablet Take 1 tablet (250 mg total) by mouth 2 (two) times daily. 08/20/17   Kathrynn Ducking, MD  levothyroxine (SYNTHROID, LEVOTHROID) 75 MCG tablet TAKE 1 TABLET BY MOUTH EVERY DAY 01/03/18   Elayne Snare, MD  Multiple Vitamin  (MULTIVITAMIN WITH MINERALS) TABS tablet Take 1 tablet by mouth daily.    [provider]  OVER THE COUNTER MEDICATION Take 1 tablet by mouth daily. Vitamin for bones    [provider]  potassium chloride SA (KLOR-CON M15) 15 MEQ tablet Take 1 tablet (15 mEq total) by mouth daily. 08/08/16   Kathrynn Ducking, MD    Family History Family History  Problem Relation Age of Onset  . Diabetes Mother   . Hypertension Mother   . Thyroid disease Mother   . Seizures Mother   . Diabetes Father   . Hypertension Father   . Thyroid disease Sister   . Thyroid disease Brother   . Seizures Brother   . Cancer Maternal Aunt        ovarian    Social History Social History   Tobacco Use  . Smoking status: Never Smoker  . Smokeless tobacco: Never Used  Substance Use Topics  . Alcohol use: No  . Drug use: No     Allergies   Topamax [topiramate]; Contrast media [iodinated diagnostic agents]; Iohexol; and Vicodin [hydrocodone-acetaminophen]   Review of Systems Review of Systems  All other systems reviewed and are negative.    Physical Exam Updated Vital Signs BP 131/84   Pulse 71   Temp 97.9 F (36.6 C) (Oral)   Resp 14   Ht 5\' 4"  (1.626 m)   Wt 68 kg   SpO2 100%   BMI 25.75 kg/m   Vital signs normal    Physical Exam  Constitutional: She is oriented to person, place, and time. She appears well-developed and well-nourished.  Non-toxic appearance. She does not appear ill. No distress.  HENT:  Head: Normocephalic and atraumatic.  Right Ear: External ear normal.  Left Ear: External ear normal.  Nose: Nose normal. No mucosal edema or rhinorrhea.  Mouth/Throat: Oropharynx is clear and moist and mucous membranes are normal. No dental abscesses or uvula swelling.  Patient sometimes has stuttering speech and then it will be clear.  Eyes: Pupils are equal, round, and reactive to light. Conjunctivae and EOM are normal.  Neck: Normal range of motion and full passive  range of motion without pain. Neck supple.  Cardiovascular: Normal rate, regular rhythm and normal heart sounds. Exam reveals no gallop and no friction rub.  No murmur heard. Pulmonary/Chest: Effort normal and breath sounds normal. No respiratory distress. She has no wheezes. She has no rhonchi. She has no rales. She exhibits no tenderness and no crepitus.  Abdominal: Soft. Normal appearance and bowel sounds are normal. She exhibits no distension. There is no tenderness. There is no rebound and no guarding.  Musculoskeletal: Normal range of motion. She exhibits no edema or tenderness.  Moves all extremities well.   Neurological: She is alert and oriented to person, place, and time. She has normal strength.  No cranial nerve deficit.  Patient is slow to perform her cranial nerve test but everything is symmetrical.  When I asked her to hold up her arm she cannot lift her upper arm off the stretcher.  When the nurse picks up her arm and lets go she dramatically flops it to the stretcher on both sides.  When she first came in she was unable to lift her leg up off the stretcher however she came back from CT she could hold her legs up against gravity and then she dramatically flops it back down on the bed.  Skin: Skin is warm, dry and intact. No rash noted. No erythema. No pallor.  Psychiatric: She has a normal mood and affect. Her speech is normal and behavior is normal. Her mood appears not anxious.  Nursing note and vitals reviewed.    ED Treatments / Results  Labs (all labs ordered are listed, but only abnormal results are displayed) Results for orders placed or performed during the hospital encounter of 02/18/18  Ethanol  Result Value Ref Range   Alcohol, Ethyl (B) <10 <10 mg/dL  Protime-INR  Result Value Ref Range   Prothrombin Time 13.0 11.4 - 15.2 seconds   INR 0.99   APTT  Result Value Ref Range   aPTT 31 24 - 36 seconds  CBC  Result Value Ref Range   WBC 9.3 4.0 - 10.5 K/uL   RBC  4.18 3.87 - 5.11 MIL/uL   Hemoglobin 12.1 12.0 - 15.0 g/dL   HCT 38.0 36.0 - 46.0 %   MCV 90.9 80.0 - 100.0 fL   MCH 28.9 26.0 - 34.0 pg   MCHC 31.8 30.0 - 36.0 g/dL   RDW 12.6 11.5 - 15.5 %   Platelets 182 150 - 400 K/uL   nRBC 0.0 0.0 - 0.2 %  Differential  Result Value Ref Range   Neutrophils Relative % 79 %   Neutro Abs 7.3 1.7 - 7.7 K/uL   Lymphocytes Relative 15 %   Lymphs Abs 1.4 0.7 - 4.0 K/uL   Monocytes Relative 6 %   Monocytes Absolute 0.6 0.1 - 1.0 K/uL   Eosinophils Relative 0 %   Eosinophils Absolute 0.0 0.0 - 0.5 K/uL   Basophils Relative 0 %   Basophils Absolute 0.0 0.0 - 0.1 K/uL   Immature Granulocytes 0 %   Abs Immature Granulocytes 0.03 0.00 - 0.07 K/uL  Comprehensive metabolic panel  Result Value Ref Range   Sodium 139 135 - 145 mmol/L   Potassium 2.7 (LL) 3.5 - 5.1 mmol/L   Chloride 108 98 - 111 mmol/L   CO2 24 22 - 32 mmol/L   Glucose, Bld 124 (H) 70 - 99 mg/dL   BUN 12 6 - 20 mg/dL   Creatinine, Ser 0.72 0.44 - 1.00 mg/dL   Calcium 8.9 8.9 - 10.3 mg/dL   Total Protein 7.1 6.5 - 8.1 g/dL   Albumin 4.0 3.5 - 5.0 g/dL   AST 20 15 - 41 U/L   ALT 10 0 - 44 U/L   Alkaline Phosphatase 67 38 - 126 U/L   Total Bilirubin 0.6 0.3 - 1.2 mg/dL   GFR calc non Af Amer >60 >60 mL/min   GFR calc Af Amer >60 >60 mL/min   Anion gap 7 5 - 15  Troponin I STAT - ONCE  Result Value Ref Range   Troponin I <0.03 <0.03 ng/mL    Laboratory interpretation all normal except hypokalemia   EKG EKG Interpretation  Date/Time:  Monday February 18 2018 05:43:05 EST Ventricular Rate:  75 PR Interval:    QRS Duration: 102 QT Interval:  426 QTC Calculation: 476 R Axis:   55 Text Interpretation:  Sinus rhythm Prolonged PR interval No significant change since last tracing 27 Jul 2016 Confirmed by Rolland Porter [phone] on 02/18/2018 6:39:11 AM   Radiology Mr Brain Wo Contrast  Result Date: 02/18/2018 CLINICAL DATA:  Awoke with speech difficulty and bilateral weakness.  Right facial droop. EXAM: MRI HEAD WITHOUT CONTRAST TECHNIQUE: Multiplanar, multiecho pulse sequences of the brain and surrounding structures were obtained without intravenous contrast. COMPARISON:  07/27/2016 brain MRI.  Head CT from earlier today FINDINGS: Brain: No acute infarction, hemorrhage, hydrocephalus, extra-axial collection or mass lesion. No white matter disease or atrophy Vascular: Major flow voids are preserved Skull and upper cervical spine: Negative for marrow lesion Sinuses/Orbits: Negative IMPRESSION: Stable and negative brain MRI. Electronically Signed   By: Monte Fantasia M.D.   On: 02/18/2018 07:22   Ct Head Code Stroke Wo Contrast  Result Date: 02/18/2018 CLINICAL DATA:  Code stroke.  Initial evaluation for acute weakness. EXAM: CT HEAD WITHOUT CONTRAST TECHNIQUE: Contiguous axial images were obtained from the base of the skull through the vertex without intravenous contrast. COMPARISON:  Prior MRI from 07/27/2016. FINDINGS: Brain: Cerebral volume within normal limits for patient age. No evidence for acute intracranial hemorrhage. No findings to suggest acute large vessel territory infarct. No mass lesion, midline shift, or mass effect. Ventricles are normal in size without evidence for hydrocephalus. No extra-axial fluid collection identified. Vascular: No hyperdense vessel identified. Skull: Scalp soft tissues demonstrate no acute abnormality. Calvarium intact. Sinuses/Orbits: Globes and orbital soft tissues within normal limits. Visualized paranasal sinuses are clear. No mastoid effusion. ASPECTS [facility] Stroke Program Early CT Score) - Ganglionic level infarction (caudate, lentiform nuclei, internal capsule, insula, M1-M3 cortex): 7 - Supraganglionic infarction (M4-M6 cortex): 3 Total score (0-10 with 10 being normal): 10 IMPRESSION: 1. Negative head CT.  No acute intracranial abnormality identified. 2. ASPECTS is 10. Critical Value/emergent results were called by telephone at the  time of interpretation on 02/18/2018 at 5:44 am to Dr. Rolland Porter , who verbally acknowledged these results. Electronically Signed   By: Jeannine Boga M.D.   On: 02/18/2018 05:48    Procedures .Critical Care Performed by: Rolland Porter, MD Authorized by: Rolland Porter, MD   Critical care provider statement:    Critical care time (minutes):  36   Critical care was necessary to treat or prevent imminent or life-threatening deterioration of the following conditions:  CNS failure or compromise   Critical care was time spent personally by me on the following activities:  Discussions with consultants, examination of patient, obtaining history from patient or surrogate, ordering and review of laboratory studies, ordering and review of radiographic studies, pulse oximetry and re-evaluation of patient's condition   (including critical care time)  Medications Ordered in ED Medications  dexamethasone (DECADRON) injection 10 mg (has no administration in time range)  diphenhydrAMINE (BENADRYL) injection 25 mg (has no administration in time range)  metoCLOPramide (REGLAN) injection 10 mg (has no administration in time range)     Initial Impression / Assessment and Plan / ED Course  I have reviewed the triage vital signs and the nursing notes.  Pertinent labs & imaging results that were available during my care of the patient were reviewed by me and considered in my medical decision making (see chart for details).    Code stroke  orders were started but code stroke was not called.  Her last seen normal was 7 hours ago.  05:48 AM Radiologist called her head CT results.   6:05 AM I talked to the tele-neurologist, Dr. Dale Equality and asked for a consult.  06:20 AM  Dr. Dale [place], I had witnessed some of his interview and during the interview he found out patient has a history of migraine headaches.  She was on Topamax but she is not on it now.  He recommends giving her a migraine cocktail and then  getting a MRI with and without contrast and a EEG.  Feels like she probably had a complicated migraine or less likely a stroke.  MRI staff stating the patient was over in the MRI machine and she does not have an IV, her MRI order was changed to MRI without contrast.  Patient failed her stroke swallow test.  She could not be given oral potassium.  Order put in for PICC line however our nursing staff looked with ultrasound and did not see anything they can start an IV with.  Nursing staff was instructed they can look at her legs because she does not have diabetes.  IV was just started in her left antecubital by nursing staff 7:40 AM.  Her medications were changed back to IV.  08:00 AM Dr Kyung Bacca, hospitalist, will admit, wants her to get the MRI Brain with contrast. Will reorder.   Final Clinical Impressions(s) / ED Diagnoses   Final diagnoses:  Other complicated headache syndrome  Weakness generalized  Hypokalemia    Plan admission  Rolland Porter, MD, Barbette Or, MD 02/18/18 [phone]

## 2018-02-18 NOTE — ED Triage Notes (Signed)
Pt went to bed at 10pm last night with no complaints, woke up this am with difficulty with speech, bilateral weakness, ? Droop to right facial area. Dr Tomi Bamberger at bedside upon pt arrival to er,

## 2018-02-18 NOTE — ED Notes (Signed)
Patient transported to MRI 

## 2018-02-18 NOTE — H&P (Signed)
History and Physical  [name] XIP:[id number] DOB: 11/21/1965 DOA: 02/18/2018  PCP: Alroy Dust, L.Marlou Sa, MD   Chief Complaint: Altered mental status  HPI:  [name] is a 52 year old female with past medical history of TIA ,possible seizure disorder migraine headaches and hypertension on hydrochlorothiazide.  She presented to the hospital for evaluation because she was having difficulty speaking this morning around 4  a.m. husband stated that about 4 AM this morning she was flopping around in the bed and could not talk.  She had gone to bed without any problems healthy at about 10 PM last night.  Husband called EMS when they arrived they thought that she had a right facial droop.  Her husband said  she had a similar problem about a year ago number around April 2018 when she had left-sided paralysis of the face her speech was slurred and her potassium was also low at that time.  MRI and CT of the brain and EEG were negative then.  They thought she may have had seizures as well as migraine headache and they started her on Keppra .  And treated her for migraines.  She says she took it only for about a week and has not taken it since.  Patient was using estrogen patches and when she started to feel the symptoms this morning she took of the estrogen patch.  Her speech is clear now she is alert she is awake.  Initially her hand was flopping when she  raise it up, it will just slumped down flat.  If there  is no strength but that has resolved.    ED Course: At the time of evaluation in the emergency room the doctor did not see any facial droop.  She received Decadron 10 mg Benadryl and metoclopramide for her migraines .  MRI and CT scan of the head was done .stroke work-up was done without a code stroke call she obtain telemetry neurology consult who advised to treat her as a complicated migraine headache.  Her potassium was found to be 2.7 and she received IV pain milliequivalents.  Review of Systems:    Positive for blurry vision headache nausea, photophobia Negative for fever, visual changes, sore throat, rash, new muscle aches, chest pain, SOB, dysuria, bleeding, vomiting,abdominal pain.  Or dysuria she does have increased frequency but she is on hydrochlorothiazide for high blood pressure Past Medical History:  Diagnosis Date  . Asthma   . Common migraine with intractable migraine 08/08/2016  . Enlarged ovary    right CT 05/2014  . Hypertension   . Hyperthyroidism   . Seizure disorder ([place]) 08/08/2016  . Seizures ([place])   . 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 Vertigo     Past Surgical History:  Procedure Laterality Date  . ABDOMINAL HYSTERECTOMY    . BALLOON DILATION N/A 05/26/2014   Procedure: BALLOON DILATION OF THE PYLORUS;  Surgeon: Danie Binder, MD;  [facility];  Service: Endoscopy;  Laterality: N/A;  . ESOPHAGOGASTRODUODENOSCOPY N/A 05/26/2014   Procedure: ESOPHAGOGASTRODUODENOSCOPY (EGD);  Surgeon: Danie Binder, MD;  Location[facility];  Service: Endoscopy;  Laterality: N/A;  . OVARIAN CYST SURGERY       reports that she has never smoked. She has never used smokeless tobacco. She reports that she does not drink alcohol or use drugs. Mobility:   Allergies  Allergen Reactions  . Topamax [Topiramate]     Nausea and vomiting  . Contrast Media [Iodinated Diagnostic Agents] Rash  . Iohexol  Nausea And Vomiting    Pt. Vomited immediately after injection of Omni 350.    . Vicodin [Hydrocodone-Acetaminophen] Rash    Family History  Problem Relation Age of Onset  . Diabetes Mother   . Hypertension Mother   . Thyroid disease Mother   . Seizures Mother   . Diabetes Father   . Hypertension Father   . Thyroid disease Sister   . Thyroid disease Brother   . Seizures Brother   . Cancer Maternal Aunt        ovarian     Prior to Admission medications   Medication Sig Start Date End Date Taking? Authorizing Provider  COD LIVER OIL PO Take 2 tablets by mouth daily.     [provider]  hydrochlorothiazide (HYDRODIURIL) 25 MG tablet Take 0.5 tablets (12.5 mg total) by mouth daily. Patient taking differently: Take 25 mg by mouth daily.  06/15/14   Rexene Alberts, MD  Iron Combinations (CHROMAGEN) capsule Take 1 capsule by mouth daily.    [provider]  levETIRAcetam (KEPPRA) 250 MG tablet Take 1 tablet (250 mg total) by mouth 2 (two) times daily. 08/20/17   Kathrynn Ducking, MD  levothyroxine (SYNTHROID, LEVOTHROID) 75 MCG tablet TAKE 1 TABLET BY MOUTH EVERY DAY 01/03/18   Elayne Snare, MD  Multiple Vitamin (MULTIVITAMIN WITH MINERALS) TABS tablet Take 1 tablet by mouth daily.    [provider]  OVER THE COUNTER MEDICATION Take 1 tablet by mouth daily. Vitamin for bones    [provider]  potassium chloride SA (KLOR-CON M15) 15 MEQ tablet Take 1 tablet (15 mEq total) by mouth daily. 08/08/16   Kathrynn Ducking, MD    Physical Exam: Vitals:   02/18/18 0630 02/18/18 0645  BP: (!) 141/81 131/84  Pulse: 71   Resp:    Temp:    SpO2: 100%     Constitutional:   . Appears calm and comfortable Eyes:  . pupils and irises appear normal . Normal lids and conjunctivae ENMT:  . grossly normal hearing  . Lips appear normal . external ears, nose appear normal . Oropharynx: mucosa, tongue,posterior pharynx appear normal Neck:  . neck appears normal, no masses, normal ROM, supple . no thyromegaly Respiratory:  . CTA bilaterally, no w/r/r.  . Respiratory effort normal. No retractions or accessory muscle use Cardiovascular:  . RRR, no m/r/g . No LE extremity edema   . Normal pedal pulses Abdomen:  . Abdomen appears normal; no tenderness or masses . No hernias . No HSM Musculoskeletal:  . Digits/nails BUE: no clubbing, cyanosis, petechiae, infection . exam of joints, bones, muscles of at least one of following: head/neck, RUE, LUE, RLE, LLE   o strength and tone normal, no atrophy, no abnormal movements o No  tenderness, masses o Normal ROM, no contractures  . gait and station Skin:  . No rashes, lesions, ulcers . palpation of skin: no induration or nodules Neurologic:  . CN 2-12 intact . Finger-to-nose pointing was normal not as slow as when the ER physician evaluated her . Sensation all 4 extremities intact . Speech is clear Psychiatric:  . Mental status o Mood, affect appropriate o Orientation to person, place, time  . judgment and insight appear intact   I have personally reviewed following labs and imaging studies  Labs:    Results for [name], [name] (MRN [id number]) as of 02/18/2018 11:55  Ref. Range 02/18/2018 05:24 02/18/2018 06:54  Sodium Latest Ref Range: 135 - 145 mmol/L 139  Potassium Latest Ref Range: 3.5 - 5.1 mmol/L 2.7 (LL)   Chloride Latest Ref Range: 98 - 111 mmol/L 108   CO2 Latest Ref Range: 22 - 32 mmol/L 24   Glucose Latest Ref Range: 70 - 99 mg/dL 124 (H)   BUN Latest Ref Range: 6 - 20 mg/dL 12   Creatinine Latest Ref Range: 0.44 - 1.00 mg/dL 0.72   Calcium Latest Ref Range: 8.9 - 10.3 mg/dL 8.9   Anion gap Latest Ref Range: 5 - 15  7   Alkaline Phosphatase Latest Ref Range: 38 - 126 U/L 67   Albumin Latest Ref Range: 3.5 - 5.0 g/dL 4.0   AST Latest Ref Range: 15 - 41 U/L 20   ALT Latest Ref Range: 0 - 44 U/L 10   Total Protein Latest Ref Range: 6.5 - 8.1 g/dL 7.1   Total Bilirubin Latest Ref Range: 0.3 - 1.2 mg/dL 0.6   GFR, Est Non African American Latest Ref Range: >60 mL/min >60   GFR, Est African American Latest Ref Range: >60 mL/min >60   Troponin I Latest Ref Range: <0.03 ng/mL  <0.03  HCG, Beta Chain, Quant, S Latest Ref Range: <5 mIU/mL  2  WBC Latest Ref Range: 4.0 - 10.5 K/uL 9.3   RBC Latest Ref Range: 3.87 - 5.11 MIL/uL 4.18   Hemoglobin Latest Ref Range: 12.0 - 15.0 g/dL 12.1   HCT Latest Ref Range: 36.0 - 46.0 % 38.0   MCV Latest Ref Range: 80.0 - 100.0 fL 90.9   MCH Latest Ref Range: 26.0 - 34.0 pg 28.9   MCHC Latest Ref Range: 30.0 -  36.0 g/dL 31.8   RDW Latest Ref Range: 11.5 - 15.5 % 12.6   Platelets Latest Ref Range: 150 - 400 K/uL 182   nRBC Latest Ref Range: 0.0 - 0.2 % 0.0   Neutrophils Latest Units: % 79   Lymphocytes Latest Units: % 15   Monocytes Relative Latest Units: % 6   Eosinophil Latest Units: % 0   Basophil Latest Units: % 0   Immature Granulocytes Latest Units: % 0   NEUT# Latest Ref Range: 1.7 - 7.7 K/uL 7.3   Lymphocyte # Latest Ref Range: 0.7 - 4.0 K/uL 1.4   Monocyte # Latest Ref Range: 0.1 - 1.0 K/uL 0.6   Eosinophils Absolute Latest Ref Range: 0.0 - 0.5 K/uL 0.0   Basophils Absolute Latest Ref Range: 0.0 - 0.1 K/uL 0.0   Abs Immature Granulocytes Latest Ref Range: 0.00 - 0.07 K/uL 0.03   Prothrombin Time Latest Ref Range: 11.4 - 15.2 seconds 13.0   INR Unknown 0.99   APTT Latest Ref Range: 24 - 36 seconds 31    Imaging studies:   EXAM: MRI HEAD WITHOUT CONTRAST  TECHNIQUE: Multiplanar, multiecho pulse sequences of the brain and surrounding structures were obtained without intravenous contrast.  COMPARISON:  07/27/2016 brain MRI.  Head CT from earlier today  FINDINGS: Brain: No acute infarction, hemorrhage, hydrocephalus, extra-axial collection or mass lesion. No white matter disease or atrophy  Vascular: Major flow voids are preserved  Skull and upper cervical spine: Negative for marrow lesion  Sinuses/Orbits: Negative  IMPRESSION: Stable and negative brain MRI.   Electronically Signed   By: Monte Fantasia M.D.   On: 02/18/2018 07:22  Medical tests:  EKG independently reviewed:  PER ED MD EKG Interpretation  Date/Time:                  Monday February 18 2018  05:43:05 EST Ventricular Rate:         75 PR Interval:                   QRS Duration: 102 QT Interval:                 426 QTC Calculation:        476 R Axis:                         55 Text Interpretation:       Sinus rhythm Prolonged PR interval No significant change since last tracing 27 Jul 2016 Confirmed by Rolland Porter [phone]) on 02/18/2018 6:39:11 AM    Test discussed with performing physician:  yes CT scan and MRI without contrast  Decision to obtain old records:     Review and summation of old records:     Principal Problem:   Stroke-like episode ([place]) Active Problems:   Essential hypertension   Hypokalemia   Hypothyroidism following radioiodine therapy   Seizure disorder ([place])   Common migraine with intractable migraine   Complicated migraine   Assessment/Plan  1.  Strokelike symptoms MRI and CT scan were negative 2.  Complicated migraine most likely diagnosis patient is being treated for migraines. 3.  Hypokalemia patient received 10 mEq IV in the ER we will continue her p.o. potassium replacement. 4.  Generalized weakness. 5.  Mild hypertension.  Patient is on hydrochlorothiazide and potassium replacement daily.  Her potassium was low on admission probably need to increase it to 20 mEq. 6.  History of seizure disorder but patient does not think she has seizures and has not been taking her seizure medicine Keppra. 7.  Hypothyroidism continue home dose of Synthroid Patient will be admitted to telemetry observation for 24 hours    Severity of Illness: The appropriate patient status for this patient is OBSERVATION. Observation status is judged to be reasonable and necessary in order to provide the required intensity of service to ensure the patient's safety. The patient's presenting symptoms, physical exam findings, and initial radiographic and laboratory data in the context of their medical condition is felt to place them at decreased risk for further clinical deterioration. Furthermore, it is anticipated that the patient will be medically stable for discharge from the hospital within 2 midnights of admission. The following factors support the patient status of observation.   " The patient's presenting symptoms include strokelike symptoms. " The physical  exam findings include focal weakness. " The initial radiographic and laboratory data are severe hypo-kalemia of 2.7.      DVT prophylaxis: Lovenox Code Status: Full Family Communication: Husband at bedside Consults called: TELEneurology Admission status: 24-hour observation  Time spent:65 minutes  Dr. Kyung Bacca [facility] Pager [phone]  02/18/2018, 9:03 AM

## 2018-02-19 ENCOUNTER — Observation Stay (HOSPITAL_COMMUNITY)
Admit: 2018-02-19 | Discharge: 2018-02-19 | Disposition: A | Discharge status: Home / Self Care | Payer: 59 | Attending: Internal Medicine | Admitting: Internal Medicine

## 2018-02-19 DIAGNOSIS — E876 Hypokalemia: Secondary | ICD-10-CM | POA: Diagnosis not present

## 2018-02-19 DIAGNOSIS — R209 Unspecified disturbances of skin sensation: Secondary | ICD-10-CM

## 2018-02-19 DIAGNOSIS — G4459 Other complicated headache syndrome: Secondary | ICD-10-CM | POA: Diagnosis not present

## 2018-02-19 DIAGNOSIS — I10 Essential (primary) hypertension: Secondary | ICD-10-CM | POA: Diagnosis not present

## 2018-02-19 DIAGNOSIS — E890 Postprocedural hypothyroidism: Secondary | ICD-10-CM

## 2018-02-19 DIAGNOSIS — G43109 Migraine with aura, not intractable, without status migrainosus: Secondary | ICD-10-CM | POA: Diagnosis not present

## 2018-02-19 DIAGNOSIS — R471 Dysarthria and anarthria: Secondary | ICD-10-CM

## 2018-02-19 DIAGNOSIS — I639 Cerebral infarction, unspecified: Secondary | ICD-10-CM | POA: Diagnosis not present

## 2018-02-19 LAB — BASIC METABOLIC PANEL
Anion gap: 5 (ref 5–15)
BUN: 11 mg/dL (ref 6–20)
CHLORIDE: 113 mmol/L — AB (ref 98–111)
CO2: 24 mmol/L (ref 22–32)
Calcium: 8.5 mg/dL — ABNORMAL LOW (ref 8.9–10.3)
Creatinine, Ser: 0.76 mg/dL (ref 0.44–1.00)
GFR calc Af Amer: >60 mL/min (ref >60)
GFR calc non Af Amer: >60 mL/min (ref >60)
GLUCOSE: 98 mg/dL (ref 70–99)
POTASSIUM: 3 mmol/L — AB (ref 3.5–5.1)
Sodium: 142 mmol/L (ref 135–145)

## 2018-02-19 LAB — VITAMIN B12: VITAMIN B 12: 237 pg/mL (ref 180–914)

## 2018-02-19 LAB — CK: Total CK: 54 U/L (ref 38–234)

## 2018-02-19 LAB — TSH: TSH: 1.469 u[IU]/mL (ref 0.350–4.500)

## 2018-02-19 LAB — MAGNESIUM: Magnesium: 2 mg/dL (ref 1.7–2.4)

## 2018-02-19 LAB — HIV ANTIBODY (ROUTINE TESTING W REFLEX): HIV Screen 4th Generation wRfx: NONREACTIVE

## 2018-02-19 LAB — FOLATE: FOLATE: 25.7 ng/mL (ref >5.9)

## 2018-02-19 MED ORDER — POTASSIUM CHLORIDE CRYS ER 20 MEQ PO TBCR
40.0000 meq | EXTENDED_RELEASE_TABLET | Freq: Once | ORAL | Status: AC
  Administered 2018-02-19: 40 meq via ORAL
  Filled 2018-02-19: qty 2

## 2018-02-19 MED ORDER — LEVOTHYROXINE SODIUM 75 MCG PO TABS
75.0000 ug | ORAL_TABLET | Freq: Every day | ORAL | Status: DC
  Administered 2018-02-19 – 2018-02-20 (×2): 75 ug via ORAL
  Filled 2018-02-19 (×2): qty 1

## 2018-02-19 MED ORDER — POTASSIUM CHLORIDE CRYS ER 20 MEQ PO TBCR: 40.0000 meq | EXTENDED_RELEASE_TABLET | Freq: Once | ORAL | Status: DC

## 2018-02-19 MED ORDER — ACETAMINOPHEN 325 MG PO TABS: 650.0000 mg | ORAL_TABLET | Freq: Four times a day (QID) | ORAL | Status: DC | PRN

## 2018-02-19 NOTE — Progress Notes (Signed)
PT Cancellation Note  Patient Details Name: [name] MRN: [id number] DOB: 09-18-1965   Cancelled Treatment:    Reason Eval/Treat Not Completed: Other (comment).  Has been up independently with no AD to her BR and is feeling she is doing well.  Declined PT for now and agreed to let PT recheck tomorrow to see if she is still doing well.   Ramond Dial 02/19/2018, 1:09 PM   1:09 PM, 02/19/18 Mee Hives, PT, MS Physical Therapist - [place] [phone] (Office)

## 2018-02-19 NOTE — Progress Notes (Signed)
EEG Completed; Results Pending. Dr Doy Mince notified.

## 2018-02-19 NOTE — Evaluation (Signed)
Clinical/Bedside Swallow Evaluation Patient Details  Name: [name] MRN: [id number] Date of Birth: 16-May-1965  Today's Date: 02/19/2018 Time: SLP Start Time (ACUTE ONLY): 39 SLP Stop Time (ACUTE ONLY): 1155 SLP Time Calculation (min) (ACUTE ONLY): 25 min  Past Medical History:  Past Medical History:  Diagnosis Date  . Asthma   . Common migraine with intractable migraine 08/08/2016  . Enlarged ovary    right CT 05/2014  . Hypertension   . Hyperthyroidism   . Seizure disorder ([place]) 08/08/2016  . Seizures ([place])   . 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 Vertigo    Past Surgical History:  Past Surgical History:  Procedure Laterality Date  . ABDOMINAL HYSTERECTOMY    . BALLOON DILATION N/A 05/26/2014   Procedure: BALLOON DILATION OF THE PYLORUS;  Surgeon: Danie Binder, MD;  [facility];  Service: Endoscopy;  Laterality: N/A;  . ESOPHAGOGASTRODUODENOSCOPY N/A 05/26/2014   Procedure: ESOPHAGOGASTRODUODENOSCOPY (EGD);  Surgeon: Danie Binder, MD;  [facility];  Service: Endoscopy;  Laterality: N/A;  . OVARIAN CYST SURGERY     HPI:  52 year old female with a history of hypertension, questionable seizure, hypothyroidism, and impaired glucose tolerance presenting with an episode of dysarthria that began in early morning of 02/18/2018.  The patient was normal when she went to bed on the evening of 02/17/2018.  Apparently, the patient woke up around 4 AM on 02/18/2018 with dysphasia and dysarthria.  The patient also described a headache and difficulty using her arms at that time.  She did describe generalized weakness with bilateral numbness and paresthesias of her hands, feet, and tongue.  She denies any visual disturbance, or syncope, or bowel or bladder incontinence.  EMS was activated.  Apparently at that time the patient had generalized weakness and needed help with transferring to the gurney.  In the emergency department, the patient was noted to have hypokalemia.  She stated  that she started feeling better after replacement of her potassium and her symptoms resolved.  CT of the brain was negative.  Tele-neurology was consulted and felt the patient may have had a competent migraine versus seizure versus conversion disorder. BSE requested due to failed [place].   Assessment / Plan / Recommendation Clinical Impression  Pt reports that she is back to baseline. Oral motor examination is WNL. Pt without signs or symptoms of aspiration or dysphagia in general during self presentation of regular textures and thin liquids. Recommend regular textures and thin liquids. No further SLP follow up indicated. SLP will sign off. Above to RN.  SLP Visit Diagnosis: Dysphagia, unspecified (R13.10)    Aspiration Risk  No limitations    Diet Recommendation Regular;Thin liquid   Liquid Administration via: Cup;Straw Medication Administration: Whole meds with liquid Supervision: Patient able to self feed Postural Changes: Seated upright at 90 degrees;Remain upright for at least 30 minutes after po intake    Other  Recommendations Oral Care Recommendations: Oral care BID;Patient independent with oral care Other Recommendations: Clarify dietary restrictions   Follow up Recommendations None      Frequency and Duration            Prognosis Prognosis for Safe Diet Advancement: Good      Swallow Study   General Date of Onset: 02/18/18 HPI: 52 year old female with a history of hypertension, questionable seizure, hypothyroidism, and impaired glucose tolerance presenting with an episode of dysarthria that began in early morning of 02/18/2018.  The patient was normal when she went to bed on the  evening of 02/17/2018.  Apparently, the patient woke up around 4 AM on 02/18/2018 with dysphasia and dysarthria.  The patient also described a headache and difficulty using her arms at that time.  She did describe generalized weakness with bilateral numbness and paresthesias of her hands, feet, and  tongue.  She denies any visual disturbance, or syncope, or bowel or bladder incontinence.  EMS was activated.  Apparently at that time the patient had generalized weakness and needed help with transferring to the gurney.  In the emergency department, the patient was noted to have hypokalemia.  She stated that she started feeling better after replacement of her potassium and her symptoms resolved.  CT of the brain was negative.  Tele-neurology was consulted and felt the patient may have had a competent migraine versus seizure versus conversion disorder. BSE requested due to failed [facility]. Type of Study: Bedside Swallow Evaluation Diet Prior to this Study: Regular;Thin liquids(Yale was repeated and Pt passed) Temperature Spikes Noted: No Respiratory Status: Room air History of Recent Intubation: No Behavior/Cognition: Alert;Cooperative;Pleasant mood Oral Cavity Assessment: Within Functional Limits Oral Care Completed by SLP: No Oral Cavity - Dentition: Adequate natural dentition Vision: Functional for self-feeding Self-Feeding Abilities: Able to feed self Patient Positioning: Upright in bed Baseline Vocal Quality: Normal Volitional Cough: Strong Volitional Swallow: Able to elicit    Oral/Motor/Sensory Function Overall Oral Motor/Sensory Function: Within functional limits   Ice Chips Ice chips: Not tested   Thin Liquid Thin Liquid: Within functional limits Presentation: Cup;Self Fed;Straw    Nectar Thick Nectar Thick Liquid: Not tested   Honey Thick Honey Thick Liquid: Not tested   Puree Puree: Within functional limits Presentation: Self Fed;Spoon   Solid     Solid: Within functional limits Presentation: Self Fed     Thank you,  Genene Churn, [place]  Bernie 02/19/2018,11:58 AM

## 2018-02-19 NOTE — Progress Notes (Signed)
Patient failed her swallow screen and is NPO. I am holding her AM meds until she is seen and evaluated by speech therapist. Will follow her recommendations.

## 2018-02-19 NOTE — Procedures (Signed)
ELECTROENCEPHALOGRAM REPORT   Patient: [name]       Room #: K327 EEG No. ID: [id number] Age: 52 y.o.        Sex: female Referring Physician: Tat Report Date:  02/19/2018        Interpreting Physician: Alexis Goodell  History: [name] is an 52 y.o. female with seizure-like activity  Medications:  Keppra, Synthroid, MVI  Conditions of Recording:  This is a 21 channel routine scalp EEG performed with bipolar and monopolar montages arranged in accordance to the international 10/20 system of electrode placement. One channel was dedicated to EKG recording.  The patient is in the awake state.  Description:  The waking background activity consists of a low voltage, symmetrical, fairly well organized, 9 Hz alpha activity, seen from the parieto-occipital and posterior temporal regions.  Low voltage fast activity, poorly organized, is seen anteriorly and is at times superimposed on more posterior regions.  A mixture of theta and alpha rhythms are seen from the central and temporal regions. The patient does not drowse or sleep. No epileptiform activity is noted.   Hyperventilation was performed and produced a mild to moderate buildup but failed to elicit any abnormalities.  Intermittent photic stimulation was performed and elicits a symmetrical driving response but fails to elicit any abnormalities.  IMPRESSION: This is a normal awake electroencephalogram, with activation procedures. There are no focal lateralizing or epileptiform features.   Alexis Goodell, MD Neurology [phone] 02/19/2018, 5:06 PM

## 2018-02-19 NOTE — Progress Notes (Addendum)
PROGRESS NOTE  [name] XLK:[id number] DOB: [birth year] DOA: 02/18/2018 PCP: Alroy Dust, L.Marlou Sa, MD  Brief History:  52 year old female with a history of hypertension, questionable seizure, hypothyroidism, and impaired glucose tolerance presenting with an episode of dysarthria that began in early morning of 02/18/2018.  The patient was normal when she went to bed on the evening of 02/17/2018.  Apparently, the patient woke up around 4 AM on 02/18/2018 with dysphasia and dysarthria.  The patient also described a headache and difficulty using her arms at that time.  She did describe generalized weakness with bilateral numbness and paresthesias of her hands, feet, and tongue.  She denies any visual disturbance, or syncope, or bowel or bladder incontinence.  EMS was activated.  Apparently at that time the patient had generalized weakness and needed help with transferring to the gurney.  In the emergency department, the patient was noted to have hypokalemia.  She stated that she started feeling better after replacement of her potassium and her symptoms resolved.  CT of the brain was negative.  Tele-neurology was consulted and felt the patient may have had a competent migraine versus seizure versus conversion disorder  Assessment/Plan: Dysphasia/dysarthria -Suspect complicated migraine versus conversion disorder -Less likely seizure disorder -02/18/2018 MRI brain with and without contrast negative for acute findings -Obtain EEG -Completely resolved -speech therapy eval  Generalized weakness/sensory disturbance -Urinalysis negative for pyuria -Urine drug screen negative -May be related to the patient's looks like disturbance -Serum D66 -Folic acid -TSH -CK -RPR -HIV--neg -Personally reviewed EKG--sinus rhythm, nonspecific T wave change  Hypokalemia -Replete -Check magnesium -Discussed discontinuing HCTZ which patient was agreeable to  Essential hypertension -Blood pressure is  well controlled -Discontinue HCTZ and monitor clinically as it has been causing recurrent symptomatic hypokalemia -Follow-up with PCP  Hypothyroidism -Continue Synthroid  Questionable seizure history -Patient last saw her neurologist, Dr. Jannifer Franklin 08/20/2017 -She states that she has not been taking the Keppra 250 mg twice daily as directed    Disposition Plan:   Home 11/13 if stable Family Communication:   Family at bedside  Consultants:  none  Code Status:  FULL   DVT Prophylaxis:   [place] Lovenox   Procedures: As Listed in Progress Note Above  Antibiotics: None       Subjective: Patient denies fevers, chills, headache, chest pain, dyspnea, nausea, vomiting, diarrhea, abdominal pain, dysuria, hematuria, hematochezia, and melena. Patient denies any dysarthria, dysphasia, sensory disturbance, headache, visual disturbance, focal extremity weakness   Objective: Vitals:   02/19/18 0030 02/19/18 0229 02/19/18 0629 02/19/18 0815  BP: 109/65 (!) 104/58 118/66   Pulse: 62 (!) 58 (!) 54   Resp:      Temp: 98.3 F (36.8 C) 98.3 F (36.8 C) 98.5 F (36.9 C)   TempSrc: Oral Oral Oral   SpO2: 99% 100% 100% 98%  Weight:      Height:        Intake/Output Summary (Last 24 hours) at 02/19/2018 0853 Last data filed at 02/18/2018 4403 Gross per 24 hour  Intake 1000 ml  Output -  Net 1000 ml   Weight change: 3.76 kg Exam:   General:  Pt is alert, follows commands appropriately, not in acute distress  HEENT: No icterus, No thrush, No neck mass, [place]/AT  Cardiovascular: RRR, S1/S2, no rubs, no gallops  Respiratory: CTA bilaterally, no wheezing, no crackles, no rhonchi  Abdomen: Soft/+BS, non tender, non distended, no guarding  Extremities: No edema, No lymphangitis, No  petechiae, No rashes, no synovitis  Neuro:  CN II-XII intact, strength 4/5 in RUE, RLE, strength 4/5 LUE, LLE; sensation intact bilateral; no dysmetria; babinski equivocal     Data Reviewed: I have  personally reviewed following labs and imaging studies Basic Metabolic Panel: Recent Labs  Lab 02/18/18 0524 02/19/18 0526  NA 139 142  K 2.7* 3.0*  CL 108 113*  CO2 24 24  GLUCOSE 124* 98  BUN 12 11  CREATININE 0.72 0.76  CALCIUM 8.9 8.5*   Liver Function Tests: Recent Labs  Lab 02/18/18 0524  AST 20  ALT 10  ALKPHOS 67  BILITOT 0.6  PROT 7.1  ALBUMIN 4.0   No results for input(s): LIPASE, AMYLASE in the last 168 hours. No results for input(s): AMMONIA in the last 168 hours. Coagulation Profile: Recent Labs  Lab 02/18/18 0524  INR 0.99   CBC: Recent Labs  Lab 02/18/18 0524  WBC 9.3  NEUTROABS 7.3  HGB 12.1  HCT 38.0  MCV 90.9  PLT 182   Cardiac Enzymes: Recent Labs  Lab 02/18/18 0654  TROPONINI <0.03   BNP: Invalid input(s): POCBNP CBG: No results for input(s): GLUCAP in the last 168 hours. HbA1C: No results for input(s): HGBA1C in the last 72 hours. Urine analysis:    Component Value Date/Time   COLORURINE YELLOW 02/18/2018 0524   APPEARANCEUR CLEAR 02/18/2018 0524   LABSPEC 1.024 02/18/2018 0524   PHURINE 5.0 02/18/2018 0524   GLUCOSEU NEGATIVE 02/18/2018 0524   HGBUR SMALL (A) 02/18/2018 0524   BILIRUBINUR NEGATIVE 02/18/2018 0524   KETONESUR NEGATIVE 02/18/2018 0524   PROTEINUR NEGATIVE 02/18/2018 0524   UROBILINOGEN 0.2 06/09/2014 1110   NITRITE NEGATIVE 02/18/2018 0524   LEUKOCYTESUR NEGATIVE 02/18/2018 0524   Sepsis Labs: @LABRCNTIP (procalcitonin:4,lacticidven:4) )No results found for this or any previous visit (from the past 240 hour(s)).   Scheduled Meds: . enoxaparin (LOVENOX) injection  40 mg Subcutaneous Q24H  . ferrous [id number] C-folic acid  1 capsule Oral Daily  . levETIRAcetam  250 mg Oral BID  . levothyroxine  75 mcg Oral QAC breakfast  . multivitamin with minerals  1 tablet Oral Daily  . potassium chloride  40 mEq Oral Once   Continuous Infusions:  Procedures/Studies: Mr Brain Wo Contrast  Result  Date: 02/18/2018 CLINICAL DATA:  Awoke with speech difficulty and bilateral weakness. Right facial droop. EXAM: MRI HEAD WITHOUT CONTRAST TECHNIQUE: Multiplanar, multiecho pulse sequences of the brain and surrounding structures were obtained without intravenous contrast. COMPARISON:  07/27/2016 brain MRI.  Head CT from earlier today FINDINGS: Brain: No acute infarction, hemorrhage, hydrocephalus, extra-axial collection or mass lesion. No white matter disease or atrophy Vascular: Major flow voids are preserved Skull and upper cervical spine: Negative for marrow lesion Sinuses/Orbits: Negative IMPRESSION: Stable and negative brain MRI. Electronically Signed   By: Monte Fantasia M.D.   On: 02/18/2018 07:22   Mr Brain W Contrast  Result Date: 02/18/2018 CLINICAL DATA:  Wake up headache with bilateral numbness and weakness EXAM: MRI HEAD WITH CONTRAST TECHNIQUE: Multiplanar, multiecho pulse sequences of the brain and surrounding structures were obtained with intravenous contrast. CONTRAST:  7 mL Gadovist IV COMPARISON:  MRI brain without contrast 02/18/2018 FINDINGS: Brain: Postcontrast imaging degraded by mild motion. Allowing for this, no abnormal enhancement identified. No mass lesion. Vascular enhancement. Leptomeningeal enhancement normal. Pituitary not enlarged. Normal orbit in skull base. IMPRESSION: Postcontrast imaging of the brain is degraded by motion. Normal enhancement without acute abnormality. Electronically Signed   By: Juanda Crumble  Carlis Abbott M.D.   On: 02/18/2018 08:44   Ct Head Code Stroke Wo Contrast  Result Date: 02/18/2018 CLINICAL DATA:  Code stroke.  Initial evaluation for acute weakness. EXAM: CT HEAD WITHOUT CONTRAST TECHNIQUE: Contiguous axial images were obtained from the base of the skull through the vertex without intravenous contrast. COMPARISON:  Prior MRI from 07/27/2016. FINDINGS: Brain: Cerebral volume within normal limits for patient age. No evidence for acute intracranial  hemorrhage. No findings to suggest acute large vessel territory infarct. No mass lesion, midline shift, or mass effect. Ventricles are normal in size without evidence for hydrocephalus. No extra-axial fluid collection identified. Vascular: No hyperdense vessel identified. Skull: Scalp soft tissues demonstrate no acute abnormality. Calvarium intact. Sinuses/Orbits: Globes and orbital soft tissues within normal limits. Visualized paranasal sinuses are clear. No mastoid effusion. ASPECTS [facility] Stroke Program Early CT Score) - Ganglionic level infarction (caudate, lentiform nuclei, internal capsule, insula, M1-M3 cortex): 7 - Supraganglionic infarction (M4-M6 cortex): 3 Total score (0-10 with 10 being normal): 10 IMPRESSION: 1. Negative head CT.  No acute intracranial abnormality identified. 2. ASPECTS is 10. Critical Value/emergent results were called by telephone at the time of interpretation on 02/18/2018 at 5:44 am to Dr. Rolland Porter , who verbally acknowledged these results. Electronically Signed   By: Jeannine Boga M.D.   On: 02/18/2018 05:48    Orson Eva, DO  [facility] Pager [phone]  If 7PM-7AM, please contact night-coverage [email or web address] Password [email or web address] 02/19/2018, 8:53 AM   LOS: 0 days

## 2018-02-20 DIAGNOSIS — I10 Essential (primary) hypertension: Secondary | ICD-10-CM

## 2018-02-20 DIAGNOSIS — G43109 Migraine with aura, not intractable, without status migrainosus: Secondary | ICD-10-CM | POA: Diagnosis not present

## 2018-02-20 DIAGNOSIS — G43019 Migraine without aura, intractable, without status migrainosus: Secondary | ICD-10-CM | POA: Diagnosis not present

## 2018-02-20 DIAGNOSIS — R471 Dysarthria and anarthria: Secondary | ICD-10-CM

## 2018-02-20 DIAGNOSIS — I639 Cerebral infarction, unspecified: Secondary | ICD-10-CM

## 2018-02-20 LAB — BASIC METABOLIC PANEL
ANION GAP: 6 (ref 5–15)
BUN: 12 mg/dL (ref 6–20)
CHLORIDE: 112 mmol/L — AB (ref 98–111)
CO2: 23 mmol/L (ref 22–32)
Calcium: 8.2 mg/dL — ABNORMAL LOW (ref 8.9–10.3)
Creatinine, Ser: 0.77 mg/dL (ref 0.44–1.00)
GFR calc Af Amer: >60 mL/min (ref >60)
GFR calc non Af Amer: >60 mL/min (ref >60)
GLUCOSE: 105 mg/dL — AB (ref 70–99)
Potassium: 3.5 mmol/L (ref 3.5–5.1)
Sodium: 141 mmol/L (ref 135–145)

## 2018-02-20 LAB — FOLATE RBC
FOLATE, RBC: 1156 ng/mL (ref >498)
Folate, Hemolysate: 428.7 ng/mL
Hematocrit: 37.1 % (ref 34.0–46.6)

## 2018-02-20 LAB — RPR: RPR: NONREACTIVE

## 2018-02-20 NOTE — Progress Notes (Signed)
IV removed, 2x2 gauze and paper tape applied to site, patient tolerated well.  Reviewed AVS with patient and patient's husband, both verbalized understanding.  Patient refused wheelchair to take down to lobby, patient to be transported home by her husband.

## 2018-02-20 NOTE — Evaluation (Signed)
Physical Therapy Evaluation Patient Details Name: [name] MRN: [id number] DOB: 04-12-1965 Today's Date: 02/20/2018   History of Present Illness  [name] is a 52 year old female with past medical history of TIA ,possible seizure disorder migraine headaches and hypertension on hydrochlorothiazide.  She presented to the hospital for evaluation because she was having difficulty speaking this morning around 4  a.m. husband stated that about 4 AM this morning she was flopping around in the bed and could not talk.  She had gone to bed without any problems healthy at about 10 PM last night.  Husband called EMS when they arrived they thought that she had a right facial droop.  Her husband said  she had a similar problem about a year ago number around April 2018 when she had left-sided paralysis of the face her speech was slurred and her potassium was also low at that time.  MRI and CT of the brain and EEG were negative then.  They thought she may have had seizures as well as migraine headache and they started her on Keppra .  And treated her for migraines.  She says she took it only for about a week and has not taken it since.  Patient was using estrogen patches and when she started to feel the symptoms this morning she took of the estrogen patch.  Her speech is clear now she is alert she is awake.  Initially her hand was flopping when she  raise it up, it will just slumped down flat.  If there  is no strength but that has resolved.    Clinical Impression  Patient functioning at baseline for functional mobility and gait.  Patient to be discharged home today. Plan:  Patient discharged from physical therapy to care of nursing for ambulation daily as tolerated for length of stay.     Follow Up Recommendations No PT follow up    Equipment Recommendations  None recommended by PT    Recommendations for Other Services       Precautions / Restrictions Precautions Precautions:  None Restrictions Weight Bearing Restrictions: No      Mobility  Bed Mobility Overal bed mobility: Independent                Transfers Overall transfer level: Independent                  Ambulation/Gait Ambulation/Gait assistance: Independent Gait Distance (Feet): 150 Feet Assistive device: None Gait Pattern/deviations: WFL(Within Functional Limits) Gait velocity: normal   General Gait Details: good return for ambulation on level, inclined, declined surfaces without loss of balance  Stairs            Wheelchair Mobility    Modified Rankin (Stroke Patients Only)       Balance Overall balance assessment: No apparent balance deficits (not formally assessed)                                           Pertinent Vitals/Pain Pain Assessment: No/denies pain    Home Living Family/patient expects to be discharged to:: Private residence Living Arrangements: Spouse/significant other Available Help at Discharge: Family Type of Home: House Home Access: Level entry     Home Layout: One level Home Equipment: None      Prior Function Level of Independence: Independent         Comments: community ambulator, drives  Hand Dominance        Extremity/Trunk Assessment   Upper Extremity Assessment Upper Extremity Assessment: Overall WFL for tasks assessed    Lower Extremity Assessment Lower Extremity Assessment: Overall WFL for tasks assessed    Cervical / Trunk Assessment Cervical / Trunk Assessment: Normal  Communication   Communication: No difficulties  Cognition Arousal/Alertness: Awake/alert Behavior During Therapy: WFL for tasks assessed/performed Overall Cognitive Status: Within Functional Limits for tasks assessed                                        General Comments      Exercises     Assessment/Plan    PT Assessment Patent does not need any further PT services  PT Problem List          PT Treatment Interventions      PT Goals (Current goals can be found in the Care Plan section)  Acute Rehab PT Goals Patient Stated Goal: return home PT Goal Formulation: With patient Time For Goal Achievement: 02/20/18 Potential to Achieve Goals: Good    Frequency     Barriers to discharge        Co-evaluation               AM-PAC PT "6 Clicks" Daily Activity  Outcome Measure Difficulty turning over in bed (including adjusting bedclothes, sheets and blankets)?: None Difficulty moving from lying on back to sitting on the side of the bed? : None Difficulty sitting down on and standing up from a chair with arms (e.g., wheelchair, bedside commode, etc,.)?: None Help needed moving to and from a bed to chair (including a wheelchair)?: None Help needed walking in hospital room?: None Help needed climbing 3-5 steps with a railing? : None 6 Click Score: 24    End of Session   Activity Tolerance: Patient tolerated treatment well Patient left: in bed;with call bell/phone within reach(seated at bedside) Nurse Communication: Mobility status PT Visit Diagnosis: Unsteadiness on feet (R26.81);Other abnormalities of gait and mobility (R26.89);Muscle weakness (generalized) (M62.81)    Time: 7867-6720 PT Time Calculation (min) (ACUTE ONLY): 23 min   Charges:   PT Evaluation $PT Eval Moderate Complexity: 1 Mod PT Treatments $Therapeutic Activity: 23-37 mins        2:40 PM, 02/20/18 Lonell Grandchild, MPT Physical Therapist with [facility] 336 [phone] office [phone] mobile phone

## 2018-02-20 NOTE — Discharge Summary (Signed)
Physician Discharge Summary  [name] BOF:[id number] DOB: 12-12-1965 DOA: 02/18/2018  PCP: Alroy Dust, L.Marlou Sa, MD  Admit date: 02/18/2018 Discharge date: 02/20/2018  Admitted From: Home Disposition: Home   Recommendations for Outpatient Follow-up:  1. Follow up with PCP in the next week with repeat BMP and BP check. Thiazide diuretic stopped due to symptomatic hypokalemia.   Home Health: None Equipment/Devices: None Discharge Condition: Stable CODE STATUS: Full Diet recommendation: Heart healthy.  Brief/Interim Summary: [name] is a 52 year old female with a history of hypertension, questionable seizure, hypothyroidism, and impaired glucose tolerance presenting with an episode of dysarthria and generalized weakness that began in early morning of 02/18/2018.  The patient was normal when she went to bed on the evening of 02/17/2018.  Apparently, the patient woke up around 4 AM on 02/18/2018 with dysphasia and dysarthria.  The patient also described a headache and difficulty using her arms at that time.  She did describe generalized weakness with bilateral numbness and paresthesias of her hands, feet, and tongue.  She denies any visual disturbance, or syncope, or bowel or bladder incontinence.  EMS was activated.  Apparently at that time the patient had generalized weakness and needed help with transferring to the gurney.  In the emergency department, the patient was noted to have hypokalemia.  She stated that she started feeling better after replacement of her potassium and her symptoms resolved. This is very similar to an episode she reported about 18 months prior.  CT of the brain was negative.  Tele-neurology was consulted and felt the patient may have had a complicated migraine versus seizure versus conversion disorder.   Discharge Diagnoses:  Principal Problem:   Stroke-like episode ([place]) Active Problems:   Essential hypertension   Hypokalemia   Hypothyroidism following  radioiodine therapy   Seizure disorder ([facility])   Common migraine with intractable migraine   Complicated migraine   Other complicated headache syndrome   Dysarthria   Sensory disturbance  Dysphasia/dysarthria: Resolved. Suspect complicated migraine versus conversion disorder. Less likely seizure disorder, EEG has retruned normal by report, MRI negative.   Generalized weakness/sensory disturbance: Urinalysis negative for pyuria, Urine drug screen negative, TSH 1.469, CK 54, folate 25, B12 237.  - Suspected to be due to hypokalemia with resolution of symptoms after replacement.  Hypokalemia: Resolved with replacement and holding HCTZ. Mg 2.0.  - Continue daily supplement, CrCl >32ml/min. - Needs recheck BMP at follow up.  Essential hypertension: Normotensive while off HCTZ. - Discontinued HCTZ and monitor clinically as it has been causing recurrent symptomatic hypokalemia - Follow-up with PCP  Hypothyroidism: TSH wnl - Continue synthroid  Questionable seizure history: Patient last saw her neurologist, Dr. Jannifer Franklin 08/20/2017 - She states that she has not been taking the Keppra 250 mg twice daily as directed  Discharge Instructions Discharge Instructions    Diet - low sodium heart healthy   Complete by:  As directed    Discharge instructions   Complete by:  As directed    Stop taking HCTZ and continue taking potassium supplement.  Follow up with your doctor in the next week to recheck blood pressure and labs. If your symptoms return, seek medical attention.   Increase activity slowly   Complete by:  As directed      Allergies as of 02/20/2018      Reactions   Topamax [topiramate]    Nausea and vomiting   Contrast Media [iodinated Diagnostic Agents] Rash   Iohexol Nausea And Vomiting   Pt. Vomited immediately after injection  of Omni 350.     Vicodin [hydrocodone-acetaminophen] Rash   Tolerates plain Tylenol      Medication List    STOP taking these medications    hydrochlorothiazide 25 MG tablet Commonly known as:  HYDRODIURIL     TAKE these medications   chromagen capsule Take 1 capsule by mouth daily.   COD LIVER OIL PO Take 2 tablets by mouth daily.   levETIRAcetam 250 MG tablet Commonly known as:  KEPPRA Take 1 tablet (250 mg total) by mouth 2 (two) times daily.   levothyroxine 75 MCG tablet Commonly known as:  SYNTHROID, LEVOTHROID TAKE 1 TABLET BY MOUTH EVERY DAY   multivitamin with minerals Tabs tablet Take 1 tablet by mouth daily.   OVER THE COUNTER MEDICATION Take 1 tablet by mouth daily. Vitamin for bones   potassium chloride SA 15 MEQ tablet Commonly known as:  KLOR-CON M15 Take 1 tablet (15 mEq total) by mouth daily.      Follow-up Information    Alroy Dust, L.Marlou Sa, MD. Schedule an appointment as soon as possible for a visit in 1 week(s).   Specialty:  Family Medicine Contact information: [address] [place] [address] [phone]          Allergies  Allergen Reactions  . Topamax [Topiramate]     Nausea and vomiting  . Contrast Media [Iodinated Diagnostic Agents] Rash  . Iohexol Nausea And Vomiting    Pt. Vomited immediately after injection of Omni 350.    . Vicodin [Hydrocodone-Acetaminophen] Rash    Tolerates plain Tylenol    Consultations:  None  Procedures/Studies: Mr Brain Wo Contrast  Result Date: 02/18/2018 CLINICAL DATA:  Awoke with speech difficulty and bilateral weakness. Right facial droop. EXAM: MRI HEAD WITHOUT CONTRAST TECHNIQUE: Multiplanar, multiecho pulse sequences of the brain and surrounding structures were obtained without intravenous contrast. COMPARISON:  07/27/2016 brain MRI.  Head CT from earlier today FINDINGS: Brain: No acute infarction, hemorrhage, hydrocephalus, extra-axial collection or mass lesion. No white matter disease or atrophy Vascular: Major flow voids are preserved Skull and upper cervical spine: Negative for marrow lesion Sinuses/Orbits:  Negative IMPRESSION: Stable and negative brain MRI. Electronically Signed   By: Monte Fantasia M.D.   On: 02/18/2018 07:22   Mr Brain W Contrast  Result Date: 02/18/2018 CLINICAL DATA:  Wake up headache with bilateral numbness and weakness EXAM: MRI HEAD WITH CONTRAST TECHNIQUE: Multiplanar, multiecho pulse sequences of the brain and surrounding structures were obtained with intravenous contrast. CONTRAST:  7 mL Gadovist IV COMPARISON:  MRI brain without contrast 02/18/2018 FINDINGS: Brain: Postcontrast imaging degraded by mild motion. Allowing for this, no abnormal enhancement identified. No mass lesion. Vascular enhancement. Leptomeningeal enhancement normal. Pituitary not enlarged. Normal orbit in skull base. IMPRESSION: Postcontrast imaging of the brain is degraded by motion. Normal enhancement without acute abnormality. Electronically Signed   By: Franchot Gallo M.D.   On: 02/18/2018 08:44   Ct Head Code Stroke Wo Contrast  Result Date: 02/18/2018 CLINICAL DATA:  Code stroke.  Initial evaluation for acute weakness. EXAM: CT HEAD WITHOUT CONTRAST TECHNIQUE: Contiguous axial images were obtained from the base of the skull through the vertex without intravenous contrast. COMPARISON:  Prior MRI from 07/27/2016. FINDINGS: Brain: Cerebral volume within normal limits for patient age. No evidence for acute intracranial hemorrhage. No findings to suggest acute large vessel territory infarct. No mass lesion, midline shift, or mass effect. Ventricles are normal in size without evidence for hydrocephalus. No extra-axial fluid collection identified.  Vascular: No hyperdense vessel identified. Skull: Scalp soft tissues demonstrate no acute abnormality. Calvarium intact. Sinuses/Orbits: Globes and orbital soft tissues within normal limits. Visualized paranasal sinuses are clear. No mastoid effusion. ASPECTS [facility] Stroke Program Early CT Score) - Ganglionic level infarction (caudate, lentiform nuclei, internal  capsule, insula, M1-M3 cortex): 7 - Supraganglionic infarction (M4-M6 cortex): 3 Total score (0-10 with 10 being normal): 10 IMPRESSION: 1. Negative head CT.  No acute intracranial abnormality identified. 2. ASPECTS is 10. Critical Value/emergent results were called by telephone at the time of interpretation on 02/18/2018 at 5:44 am to Dr. Rolland Porter , who verbally acknowledged these results. Electronically Signed   By: Jeannine Boga M.D.   On: 02/18/2018 05:48    EEG 02/19/2018: IMPRESSION: This is a normal awake electroencephalogram, with activation procedures. There are no focal lateralizing or epileptiform features.  Subjective: Feels well. Wants to go home. No seizure like activity, headache, visual disturbance, weakness, numbness, trouble speaking.   Discharge Exam: Vitals:   02/20/18 0623 02/20/18 0819  BP: 100/60   Pulse: (!) 57   Resp:    Temp: 98.2 F (36.8 C)   SpO2: 99% 99%   General: Pt is alert, awake, not in acute distress Cardiovascular: RRR, S1/S2 +, no rubs, no gallops Respiratory: CTA bilaterally, no wheezing, no rhonchi Abdominal: Soft, NT, ND, bowel sounds + Extremities: No edema, no cyanosis Neuro: Alert, oriented, no focal deficits in cranial or peripheral nerves.   Labs: BNP (last 3 results) No results for input(s): BNP in the last 8760 hours. Basic Metabolic Panel: Recent Labs  Lab 02/18/18 0524 02/19/18 0526 02/19/18 1344 02/20/18 0530  NA 139 142  --  141  K 2.7* 3.0*  --  3.5  CL 108 113*  --  112*  CO2 24 24  --  23  GLUCOSE 124* 98  --  105*  BUN 12 11  --  12  CREATININE 0.72 0.76  --  0.77  CALCIUM 8.9 8.5*  --  8.2*  MG  --   --  2.0  --    Liver Function Tests: Recent Labs  Lab 02/18/18 0524  AST 20  ALT 10  ALKPHOS 67  BILITOT 0.6  PROT 7.1  ALBUMIN 4.0   No results for input(s): LIPASE, AMYLASE in the last 168 hours. No results for input(s): AMMONIA in the last 168 hours. CBC: Recent Labs  Lab 02/18/18 0524  WBC  9.3  NEUTROABS 7.3  HGB 12.1  HCT 38.0  MCV 90.9  PLT 182   Cardiac Enzymes: Recent Labs  Lab 02/18/18 0654 02/19/18 1344  CKTOTAL  --  71  TROPONINI <0.03  --    BNP: Invalid input(s): POCBNP CBG: No results for input(s): GLUCAP in the last 168 hours. D-Dimer No results for input(s): DDIMER in the last 72 hours. Hgb A1c No results for input(s): HGBA1C in the last 72 hours. Lipid Profile No results for input(s): CHOL, HDL, LDLCALC, TRIG, CHOLHDL, LDLDIRECT in the last 72 hours. Thyroid function studies Recent Labs    02/19/18 1344  TSH 1.469   Anemia work up Recent Labs    02/19/18 1344  VITAMINB12 237  FOLATE 25.7   Urinalysis    Component Value Date/Time   COLORURINE YELLOW 02/18/2018 [place] 02/18/2018 0524   LABSPEC 1.024 02/18/2018 0524   PHURINE 5.0 02/18/2018 0524   GLUCOSEU NEGATIVE 02/18/2018 0524   HGBUR SMALL (A) 02/18/2018 [place] NEGATIVE 02/18/2018 0524  KETONESUR NEGATIVE 02/18/2018 0524   PROTEINUR NEGATIVE 02/18/2018 0524   UROBILINOGEN 0.2 06/09/2014 1110   NITRITE NEGATIVE 02/18/2018 [place] 02/18/2018 0524    Microbiology No results found for this or any previous visit (from the past 240 hour(s)).  Time coordinating discharge: Approximately 40 minutes  Patrecia Pour, MD  [facility] 02/20/2018, 12:36 PM Pager [phone]

## 2018-02-25 DIAGNOSIS — E876 Hypokalemia: Secondary | ICD-10-CM | POA: Diagnosis not present

## 2018-02-25 DIAGNOSIS — R51 Headache: Secondary | ICD-10-CM | POA: Diagnosis not present

## 2018-02-28 ENCOUNTER — Other Ambulatory Visit: Payer: Self-pay

## 2018-02-28 ENCOUNTER — Encounter (HOSPITAL_COMMUNITY): Payer: Self-pay | Admitting: Emergency Medicine

## 2018-02-28 ENCOUNTER — Emergency Department (HOSPITAL_COMMUNITY)
Admission: EM | Admit: 2018-02-28 | Discharge: 2018-02-28 | Disposition: A | Discharge status: Home / Self Care | Payer: 59 | Attending: Emergency Medicine | Admitting: Emergency Medicine

## 2018-02-28 DIAGNOSIS — Z79899 Other long term (current) drug therapy: Secondary | ICD-10-CM | POA: Insufficient documentation

## 2018-02-28 DIAGNOSIS — J45909 Unspecified asthma, uncomplicated: Secondary | ICD-10-CM | POA: Diagnosis not present

## 2018-02-28 DIAGNOSIS — R42 Dizziness and giddiness: Secondary | ICD-10-CM | POA: Insufficient documentation

## 2018-02-28 DIAGNOSIS — H81399 Other peripheral vertigo, unspecified ear: Secondary | ICD-10-CM | POA: Diagnosis not present

## 2018-02-28 DIAGNOSIS — I10 Essential (primary) hypertension: Secondary | ICD-10-CM | POA: Diagnosis not present

## 2018-02-28 DIAGNOSIS — E0590 Thyrotoxicosis, unspecified without thyrotoxic crisis or storm: Secondary | ICD-10-CM | POA: Diagnosis not present

## 2018-02-28 LAB — CBC WITH DIFFERENTIAL/PLATELET
ABS IMMATURE GRANULOCYTES: 0.04 10*3/uL (ref 0.00–0.07)
BASOS PCT: 0 %
Basophils Absolute: 0 10*3/uL (ref 0.0–0.1)
Eosinophils Absolute: 0 10*3/uL (ref 0.0–0.5)
Eosinophils Relative: 0 %
HCT: 39.5 % (ref 36.0–46.0)
Hemoglobin: 13 g/dL (ref 12.0–15.0)
Immature Granulocytes: 1 %
Lymphocytes Relative: 29 %
Lymphs Abs: 2.1 10*3/uL (ref 0.7–4.0)
MCH: 29.7 pg (ref 26.0–34.0)
MCHC: 32.9 g/dL (ref 30.0–36.0)
MCV: 90.4 fL (ref 80.0–100.0)
MONO ABS: 0.6 10*3/uL (ref 0.1–1.0)
MONOS PCT: 8 %
NEUTROS ABS: 4.4 10*3/uL (ref 1.7–7.7)
Neutrophils Relative %: 62 %
Platelets: 191 10*3/uL (ref 150–400)
RBC: 4.37 MIL/uL (ref 3.87–5.11)
RDW: 12.7 % (ref 11.5–15.5)
WBC: 7.1 10*3/uL (ref 4.0–10.5)
nRBC: 0 % (ref 0.0–0.2)

## 2018-02-28 LAB — COMPREHENSIVE METABOLIC PANEL
ALT: 12 U/L (ref 0–44)
ANION GAP: 7 (ref 5–15)
AST: 15 U/L (ref 15–41)
Albumin: 4.3 g/dL (ref 3.5–5.0)
Alkaline Phosphatase: 70 U/L (ref 38–126)
BUN: 12 mg/dL (ref 6–20)
CHLORIDE: 112 mmol/L — AB (ref 98–111)
CO2: 21 mmol/L — AB (ref 22–32)
Calcium: 9.7 mg/dL (ref 8.9–10.3)
Creatinine, Ser: 0.78 mg/dL (ref 0.44–1.00)
Glucose, Bld: 97 mg/dL (ref 70–99)
POTASSIUM: 4.1 mmol/L (ref 3.5–5.1)
SODIUM: 140 mmol/L (ref 135–145)
Total Bilirubin: 0.5 mg/dL (ref 0.3–1.2)
Total Protein: 7.8 g/dL (ref 6.5–8.1)

## 2018-02-28 LAB — LIPASE, BLOOD: LIPASE: 50 U/L (ref 11–51)

## 2018-02-28 LAB — TROPONIN I

## 2018-02-28 LAB — MAGNESIUM: MAGNESIUM: 1.8 mg/dL (ref 1.7–2.4)

## 2018-02-28 MED ORDER — MECLIZINE HCL 12.5 MG PO TABS
50.0000 mg | ORAL_TABLET | Freq: Once | ORAL | Status: AC
  Administered 2018-02-28: 50 mg via ORAL
  Filled 2018-02-28: qty 4

## 2018-02-28 MED ORDER — MECLIZINE HCL 25 MG PO TABS: 25.0000 mg | ORAL_TABLET | Freq: Three times a day (TID) | ORAL | 0 refills | Status: DC | PRN

## 2018-02-28 NOTE — ED Notes (Signed)
Attempted to get blood via ultrasound, upon entering skin pt asked this RN to stop. Lab notified.

## 2018-02-28 NOTE — ED Notes (Signed)
ED Provider at bedside. 

## 2018-02-28 NOTE — ED Triage Notes (Signed)
Patient reports being hospitalized last Monday through Wednesday. Had MRI, EEG, and other tests. Was found to be hypokalemic. Patient states she has continued to have chest tightness and tingling in her tongue and distal extremities. C/O dizziness and nausea that have not resolved.

## 2018-02-28 NOTE — ED Provider Notes (Signed)
[facility] EMERGENCY DEPARTMENT Provider Note   CSN: [id number] Arrival date & time: 02/28/18  1525     History   Chief Complaint Chief Complaint  Patient presents with  . Dizziness    HPI [name] is a 52 y.o. female.  HPI Patient recently admitted for stroke work-up which included CT head, MRI and EEG which were all negative.  Thought likely complicated migraine versus conversion disorder.  Patient states that she is continued to have dizziness which she describes as spinning sensation worse when she moves her head.  Associated with nausea.  She is having some tingling sensation to the tip of her tongue and her fingers.  No new focal weakness.   Past Medical History:  Diagnosis Date  . Asthma   . Common migraine with intractable migraine 08/08/2016  . Enlarged ovary    right CT 05/2014  . Hypertension   . Hyperthyroidism   . Seizure disorder ([place]) 08/08/2016  . Seizures ([place])   . 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 Vertigo     Patient Active Problem List   Diagnosis Date Noted  . Dysarthria 02/19/2018  . Sensory disturbance 02/19/2018  . Other complicated headache syndrome   . Complicated migraine 32/20/2542  . Stroke-like episode ([place]) 02/18/2018  . Seizure disorder ([place]) 08/08/2016  . Common migraine with intractable migraine 08/08/2016  . 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 Muscle cramp 01/13/2015  . Hyperglycemia, drug-induced 06/10/2014  . Anemia due to other cause 06/10/2014  . Hypokalemia 06/10/2014  . Malnutrition of moderate degree ([place]) 06/02/2014  . Elevated d-dimer   . Altered mental status 06/01/2014  . Chest pain 06/01/2014  . Generalized weakness   . Enlarged ovary 05/28/2014  . Weight loss   . UTI (urinary tract infection) 05/26/2014  . Abdominal pain   . Fever   . Nausea with vomiting   . Nausea & vomiting 05/23/2014  . Pancreatitis 05/23/2014  . Essential hypertension 05/23/2014  . Rapid weight loss 05/23/2014  .  Tachycardia 05/23/2014  . Dehydration 05/23/2014  . GERD (gastroesophageal reflux disease) 05/23/2014  . Generalized abdominal pain     Past Surgical History:  Procedure Laterality Date  . ABDOMINAL HYSTERECTOMY    . BALLOON DILATION N/A 05/26/2014   Procedure: BALLOON DILATION OF THE PYLORUS;  Surgeon: Danie Binder, MD;  Location: [facility] ENDO SUITE;  Service: Endoscopy;  Laterality: N/A;  . ESOPHAGOGASTRODUODENOSCOPY N/A 05/26/2014   Procedure: ESOPHAGOGASTRODUODENOSCOPY (EGD);  Surgeon: Danie Binder, MD;  Location: [facility] ENDO SUITE;  Service: Endoscopy;  Laterality: N/A;  . OVARIAN CYST SURGERY       OB History    Gravida  4   Para  2   Term  2   Preterm      AB  2   Living        SAB  2   TAB      Ectopic      Multiple      Live Births               Home Medications    Prior to Admission medications   Medication Sig Start Date End Date Taking? Authorizing Provider  COD LIVER OIL PO Take 2 tablets by mouth daily.   Yes [provider]  estradiol (CLIMARA - DOSED IN MG/24 HR) 0.05 mg/24hr patch APPLY ONCE A WEEK 02/14/18  Yes [provider]  Iron Combinations (CHROMAGEN) capsule Take 1 capsule by mouth daily.   Yes [provider]  levothyroxine (SYNTHROID, LEVOTHROID) 75 MCG tablet TAKE 1 TABLET BY MOUTH EVERY DAY 01/03/18  Yes Elayne Snare, MD  Multiple Vitamin (MULTIVITAMIN WITH MINERALS) TABS tablet Take 1 tablet by mouth daily.   Yes [provider]  meclizine (ANTIVERT) 25 MG tablet Take 1 tablet (25 mg total) by mouth 3 (three) times daily as needed for dizziness. 02/28/18   Julianne Rice, MD    Family History Family History  Problem Relation Age of Onset  . Diabetes Mother   . Hypertension Mother   . Thyroid disease Mother   . Seizures Mother   . Diabetes Father   . Hypertension Father   . Thyroid disease Sister   . Thyroid disease Brother   . Seizures Brother   . Cancer Maternal Aunt        ovarian     Social History Social History   Tobacco Use  . Smoking status: Never Smoker  . Smokeless tobacco: Never Used  Substance Use Topics  . Alcohol use: No  . Drug use: No     Allergies   Topamax [topiramate]; Contrast media [iodinated diagnostic agents]; Iohexol; and Vicodin [hydrocodone-acetaminophen]   Review of Systems Review of Systems  Constitutional: Negative for chills and fever.  HENT: Negative for facial swelling, sore throat and trouble swallowing.   Eyes: Negative for visual disturbance.  Respiratory: Negative for cough and shortness of breath.   Cardiovascular: Negative for chest pain, palpitations and leg swelling.  Gastrointestinal: Positive for nausea. Negative for abdominal pain and diarrhea.  Genitourinary: Negative for dysuria, flank pain, frequency and hematuria.  Musculoskeletal: Negative for back pain, myalgias, neck pain and neck stiffness.  Skin: Negative for rash and wound.  Neurological: Positive for dizziness and numbness. Negative for speech difficulty, weakness and headaches.  Psychiatric/Behavioral: The patient is nervous/anxious.   All other systems reviewed and are negative.    Physical Exam Updated Vital Signs BP 138/77   Pulse 66   Temp 98.1 F (36.7 C) (Oral)   Resp 18   Ht 5\' 4"  (1.626 m)   Wt 71.8 kg   SpO2 100%   BMI 27.17 kg/m   Physical Exam  Constitutional: She is oriented to person, place, and time. She appears well-developed and well-nourished. No distress.  HENT:  Head: Normocephalic and atraumatic.  Mouth/Throat: Oropharynx is clear and moist.  Cranial nerves II through XII intact  Eyes: Pupils are equal, round, and reactive to light. EOM are normal.  Fatigable horizontal nystagmus  Neck: Normal range of motion. Neck supple. No JVD present.  Cardiovascular: Normal rate and regular rhythm. Exam reveals no gallop and no friction rub.  No murmur heard. Pulmonary/Chest: Effort normal and breath sounds normal. No  stridor. No respiratory distress. She has no wheezes. She has no rales. She exhibits no tenderness.  Abdominal: Soft. Bowel sounds are normal. There is no tenderness. There is no rebound and no guarding.  Musculoskeletal: Normal range of motion. She exhibits no edema or tenderness.  Lymphadenopathy:    She has no cervical adenopathy.  Neurological: She is alert and oriented to person, place, and time.  Patient is alert and oriented x3 with clear, goal oriented speech. Patient has 5/5 motor in all extremities. Sensation is intact to light touch. Bilateral finger-to-nose is normal with no signs of dysmetria.   Skin: Skin is warm and dry. Capillary refill takes less than 2 seconds. No rash noted. She is not diaphoretic. No erythema.  Psychiatric: She has a normal mood  and affect. Her behavior is normal.  Nursing note and vitals reviewed.    ED Treatments / Results  Labs (all labs ordered are listed, but only abnormal results are displayed) Labs Reviewed  COMPREHENSIVE METABOLIC PANEL - Abnormal; Notable for the following components:      Result Value   Chloride 112 (*)    CO2 21 (*)    All other components within normal limits  CBC WITH DIFFERENTIAL/PLATELET  LIPASE, BLOOD  MAGNESIUM  TROPONIN I    EKG None  Radiology No results found.  Procedures Procedures (including critical care time)  Medications Ordered in ED Medications  meclizine (ANTIVERT) tablet 50 mg (50 mg Oral Given 02/28/18 1635)     Initial Impression / Assessment and Plan / ED Course  I have reviewed the triage vital signs and the nursing notes.  Pertinent labs & imaging results that were available during my care of the patient were reviewed by me and considered in my medical decision making (see chart for details).    Patient states that her symptoms have completely resolved with meclizine.  Recently admitted with full stroke work-up which was negative including MRI and EEG.  Do not believe that further  imaging is necessary at this point.  Will discharge home with prescription for meclizine.  Suspect she is having peripheral vertigo.  Advised to follow-up with ENT if her symptoms persist.  Return precautions given.   Final Clinical Impressions(s) / ED Diagnoses   Final diagnoses:  Peripheral vertigo, unspecified laterality    ED Discharge Orders         Ordered    meclizine (ANTIVERT) 25 MG tablet  3 times daily PRN     02/28/18 1952           Julianne Rice, MD 02/28/18 1956

## 2018-03-11 DIAGNOSIS — I10 Essential (primary) hypertension: Secondary | ICD-10-CM | POA: Diagnosis not present

## 2018-03-12 DIAGNOSIS — G43109 Migraine with aura, not intractable, without status migrainosus: Secondary | ICD-10-CM | POA: Diagnosis not present

## 2018-03-12 DIAGNOSIS — R42 Dizziness and giddiness: Secondary | ICD-10-CM | POA: Diagnosis not present

## 2018-03-12 DIAGNOSIS — Z0110 Encounter for examination of ears and hearing without abnormal findings: Secondary | ICD-10-CM | POA: Diagnosis not present

## 2018-03-20 DIAGNOSIS — G43109 Migraine with aura, not intractable, without status migrainosus: Secondary | ICD-10-CM | POA: Diagnosis not present

## 2018-03-21 DIAGNOSIS — N951 Menopausal and female climacteric states: Secondary | ICD-10-CM | POA: Diagnosis not present

## 2018-04-29 ENCOUNTER — Other Ambulatory Visit (INDEPENDENT_AMBULATORY_CARE_PROVIDER_SITE_OTHER): Payer: 59

## 2018-04-29 DIAGNOSIS — E890 Postprocedural hypothyroidism: Secondary | ICD-10-CM

## 2018-04-29 LAB — T4, FREE: FREE T4: 0.79 ng/dL (ref 0.60–1.60)

## 2018-04-29 LAB — TSH: TSH: 6.12 u[IU]/mL — AB (ref 0.35–4.50)

## 2018-05-02 ENCOUNTER — Encounter: Payer: Self-pay | Admitting: Endocrinology

## 2018-05-02 ENCOUNTER — Ambulatory Visit: Payer: 59 | Admitting: Endocrinology

## 2018-05-02 VITALS — BP 130/70 | HR 77 | Ht 64.0 in | Wt 159.8 lb

## 2018-05-02 DIAGNOSIS — E890 Postprocedural hypothyroidism: Secondary | ICD-10-CM | POA: Diagnosis not present

## 2018-05-02 MED ORDER — LEVOTHYROXINE SODIUM 88 MCG PO TABS: 88.0000 ug | ORAL_TABLET | Freq: Every day | ORAL | 3 refills | Status: DC

## 2018-05-02 NOTE — Progress Notes (Signed)
Patient ID: [name], female   DOB: 07/14/1965, 53 y.o.   MRN: [id number]            Reason for Appointment:  Hypothyroidism, follow-up visit    History of Present Illness:   Background information: She was treated for hyperthyroidism in 05/2014; this was diagnosed when she was admitted to the hospital with nausea, weight loss of 20-25 pounds and constipation.  She was then found to have significant tachycardia and Hyperthyroidism  was diagnosed She got 15 mCi in 06/2014 and subsequently became hypothyroid, details of when she first went on thyroid supplements are unknown. She was initially treated with 75 g of levothyroxine Subsequently been TSH was 11 this was increased to 112 g which however brought her TSH down to 0.11 She was then started on 100 g of levothyroxine In 04/2015 she had an episode of syncope along with symptoms of nausea and was evaluated in an emergency room in [place] where she was found to have a TSH of 0.15 Subsequently had been on 50 g of levothyroxine which caused her to have significant fatigue, nausea and dizziness  RECENT history: Previously her TSH was 25.7 her Synthroid was increased from 50 up to 88 g in  05/2015 Subsequently when her TSH was low normal she was told to take a half tablet less per week Her dose was reduced to 75 g in 07/2016  Levothyroxine was increased by half a tablet weekly in 10/18 when her TSH was 5 However subsequently her TSH was getting lower and in 7/19 was 0.49 She is now taking 75 mcg 1 tablet daily only  She feels fairly good with no unusual fatigue or cold intolerance.  She has mild tiredness but she thinks this is not new She has a stable weight  She is very consistent with taking her levothyroxine in the mornings without any vitamins She has not missed any doses recently  Although TSH checked in the ER in November was 1.5 it is now up to 6.1  Patient's weight history is as follows:  Wt Readings from Last 3  Encounters:  05/02/18 159 lb 12.8 oz (72.5 kg)  02/28/18 158 lb 4.6 oz (71.8 kg)  02/18/18 158 lb 4.6 oz (71.8 kg)    Thyroid function results as follows:  Lab Results  Component Value Date   TSH 6.12 (H) 04/29/2018   TSH 1.469 02/19/2018   TSH 0.49 10/26/2017   FREET4 0.79 04/29/2018   FREET4 1.29 10/26/2017   FREET4 1.23 04/27/2017    Lab Results  Component Value Date   TSH 6.12 (H) 04/29/2018   TSH 1.469 02/19/2018   TSH 0.49 10/26/2017   TSH 0.65 04/27/2017   TSH 5.03 (H) 01/25/2017   TSH 0.42 07/21/2016   TSH 1.86 01/17/2016   TSH 0.96 06/10/2015   TSH 25.67 (H) 05/11/2015     Past Medical History:  Diagnosis Date  . Asthma   . Common migraine with intractable migraine 08/08/2016  . Enlarged ovary    right CT 05/2014  . Hypertension   . Hyperthyroidism   . Seizure disorder ([place]) 08/08/2016  . Seizures ([place])   . 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 Vertigo     Past Surgical History:  Procedure Laterality Date  . ABDOMINAL HYSTERECTOMY    . BALLOON DILATION N/A 05/26/2014   Procedure: BALLOON DILATION OF THE PYLORUS;  Surgeon: Danie Binder, MD;  [facility];  Service: Endoscopy;  Laterality: N/A;  . ESOPHAGOGASTRODUODENOSCOPY N/A 05/26/2014  Procedure: ESOPHAGOGASTRODUODENOSCOPY (EGD);  Surgeon: Danie Binder, MD;  [facility];  Service: Endoscopy;  Laterality: N/A;  . OVARIAN CYST SURGERY      Family History  Problem Relation Age of Onset  . Diabetes Mother   . Hypertension Mother   . Thyroid disease Mother   . Seizures Mother   . Diabetes Father   . Hypertension Father   . Thyroid disease Sister   . Thyroid disease Brother   . Seizures Brother   . Cancer Maternal Aunt        ovarian    Social History:  reports that she has never smoked. She has never used smokeless tobacco. She reports that she does not drink alcohol or use drugs.  Allergies:  Allergies  Allergen Reactions  . Topamax [Topiramate]     Nausea and vomiting  .  Contrast Media [Iodinated Diagnostic Agents] Rash  . Iohexol Nausea And Vomiting    Pt. Vomited immediately after injection of Omni 350.    . Vicodin [Hydrocodone-Acetaminophen] Rash    Tolerates plain Tylenol    Allergies as of 05/02/2018      Reactions   Topamax [topiramate]    Nausea and vomiting   Contrast Media [iodinated Diagnostic Agents] Rash   Iohexol Nausea And Vomiting   Pt. Vomited immediately after injection of Omni 350.     Vicodin [hydrocodone-acetaminophen] Rash   Tolerates plain Tylenol      Medication List       Accurate as of May 02, 2018 10:13 AM. Always use your most recent med list.        chromagen capsule Take 1 capsule by mouth daily.   COD LIVER OIL PO Take 2 tablets by mouth daily.   estradiol 0.05 mg/24hr patch Commonly known as:  CLIMARA - Dosed in mg/24 hr APPLY ONCE A WEEK   levothyroxine 75 MCG tablet Commonly known as:  SYNTHROID, LEVOTHROID TAKE 1 TABLET BY MOUTH EVERY DAY   meclizine 25 MG tablet Commonly known as:  ANTIVERT Take 1 tablet (25 mg total) by mouth 3 (three) times daily as needed for dizziness.   multivitamin with minerals Tabs tablet Take 1 tablet by mouth daily.          Review of Systems    She has long-standing history of seizures, on treatment  She is having problems with vertigo and hypokalemia and treated in the ER in November  She is having good relief of her hot flashes and difficulty sleeping to being treated with HRT by gynecologist more recently              Examination:    BP 130/70 (BP Location: Left Arm, Patient Position: Sitting, Cuff Size: Normal)   Pulse 77   Ht 5\' 4"  (1.626 m)   Wt 159 lb 12.8 oz (72.5 kg)   SpO2 98%   BMI 27.43 kg/m   She looks well Thyroid not palpable Triceps reflexes normal Skin appears normal   Assessment:  HYPOTHYROIDISM, secondary to radioactive iodine treatment Graves' disease in early 2016 She has been on variable doses of levothyroxine in the  past More recently has been taking only 75 mcg  She is doing subjectively fairly well though tends to have some fatigue which is not new No weight change  She has usually taking her levothyroxine very consistently as directed  Although her TSH had been normal in 11/19 it is now higher at 6.   PLAN:   She will need  to go back up to 88 mcg follow-up in 2 months She will try to keep taking this regularly without any food before breakfast and take her vitamins at night  She will follow-up with her PCP and neurologist for other ongoing problems   Elayne Snare 05/02/2018, 10:13 AM

## 2018-05-13 DIAGNOSIS — M9903 Segmental and somatic dysfunction of lumbar region: Secondary | ICD-10-CM | POA: Diagnosis not present

## 2018-05-13 DIAGNOSIS — M9902 Segmental and somatic dysfunction of thoracic region: Secondary | ICD-10-CM | POA: Diagnosis not present

## 2018-05-13 DIAGNOSIS — M5441 Lumbago with sciatica, right side: Secondary | ICD-10-CM | POA: Diagnosis not present

## 2018-05-15 DIAGNOSIS — K640 First degree hemorrhoids: Secondary | ICD-10-CM | POA: Diagnosis not present

## 2018-05-15 DIAGNOSIS — Z1211 Encounter for screening for malignant neoplasm of colon: Secondary | ICD-10-CM | POA: Diagnosis not present

## 2018-05-15 DIAGNOSIS — D120 Benign neoplasm of cecum: Secondary | ICD-10-CM | POA: Diagnosis not present

## 2018-05-21 ENCOUNTER — Encounter: Payer: Self-pay | Admitting: Neurology

## 2018-05-21 ENCOUNTER — Ambulatory Visit: Payer: 59 | Admitting: Neurology

## 2018-05-21 VITALS — BP 132/87 | HR 83 | Ht 64.0 in | Wt 152.0 lb

## 2018-05-21 DIAGNOSIS — G43019 Migraine without aura, intractable, without status migrainosus: Secondary | ICD-10-CM

## 2018-05-21 DIAGNOSIS — G40909 Epilepsy, unspecified, not intractable, without status epilepticus: Secondary | ICD-10-CM | POA: Diagnosis not present

## 2018-05-21 MED ORDER — PROPRANOLOL HCL 20 MG PO TABS: 20.0000 mg | ORAL_TABLET | Freq: Two times a day (BID) | ORAL | 3 refills | Status: DC

## 2018-05-21 MED ORDER — LEVETIRACETAM 250 MG PO TABS: 250.0000 mg | ORAL_TABLET | Freq: Two times a day (BID) | ORAL | 3 refills | Status: DC

## 2018-05-21 NOTE — Patient Instructions (Signed)
We will restart the Keppra 250 mg twice a day.   Start propranolol 20 mg twice a day for the headache and dizziness. Call for any dose adjustments.   Inderal (propranolol) is a blood pressure medication that is commonly used for migraine headaches. This is a type of beta blocker. The most common side effects include low heart rate, dizziness, fatigue, and increased depression. This medication may worsen asthma. If you believe that you are having side effects on this medication, please contact our office.

## 2018-05-21 NOTE — Progress Notes (Signed)
Reason for visit: Seizures, headache, dizziness  [name] is a 53 y.o. female  History of present illness:  Ms. Caison is a 53 year old right-handed black female with a history of seizures, the last seizure she claims that she has had was in April 2018.  The patient has been on low-dose Keppra, she was unable to tolerate Topamax or Zonegran previously.  For some reason, the patient has stopped Keppra, she is not able to give me an explanation as to when or why, she appeared to be tolerating the drug fairly well.  The patient was seen in the emergency room and admitted to the hospital on 18 February 2018 with onset of dysarthria and generalized weakness, she was found to have hypokalemia, replacement of the potassium was helpful.  The patient has subsequently been taken off of her diuretic, the patient has had episodes similar to the above with hypokalemia in the past.  The potassium level was 2.7.  The patient returned to the emergency room on 28 February 2018 with dizziness, the patient denies true vertigo.  The patient has had episodes of dizziness that may be random lasting a minute or so, the patient associates the episodes of dizziness with headaches that may last up to 3 days.  The headaches are associated with a bandlike pressure sensation around the head that may come and go.  The patient has been seen through [facility] through ENT with an evaluation for the dizziness, she was not felt to have true vertigo.  The patient does not take any medications for her headaches.  The patient still operates a motor vehicle.  She has not had any recent seizures.  Past Medical History:  Diagnosis Date  . Asthma   . Common migraine with intractable migraine 08/08/2016  . Enlarged ovary    right CT 05/2014  . Hypertension   . Hyperthyroidism   . Seizure disorder ([place]) 08/08/2016  . Seizures ([place])   . 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 Vertigo     Past Surgical History:  Procedure Laterality Date  . ABDOMINAL  HYSTERECTOMY    . BALLOON DILATION N/A 05/26/2014   Procedure: BALLOON DILATION OF THE PYLORUS;  Surgeon: Danie Binder, MD;  [facility];  Service: Endoscopy;  Laterality: N/A;  . ESOPHAGOGASTRODUODENOSCOPY N/A 05/26/2014   Procedure: ESOPHAGOGASTRODUODENOSCOPY (EGD);  Surgeon: Danie Binder, MD;  [facility];  Service: Endoscopy;  Laterality: N/A;  . OVARIAN CYST SURGERY      Family History  Problem Relation Age of Onset  . Diabetes Mother   . Hypertension Mother   . Thyroid disease Mother   . Seizures Mother   . Diabetes Father   . Hypertension Father   . Thyroid disease Sister   . Thyroid disease Brother   . Seizures Brother   . Cancer Maternal Aunt        ovarian    Social history:  reports that she has never smoked. She has never used smokeless tobacco. She reports that she does not drink alcohol or use drugs.  Medications:  Prior to Admission medications   Medication Sig Start Date End Date Taking? Authorizing Provider  COD LIVER OIL PO Take 2 tablets by mouth daily.   Yes [provider]  estradiol (CLIMARA - DOSED IN MG/24 HR) 0.05 mg/24hr patch APPLY ONCE A WEEK 02/14/18  Yes [provider]  Iron Combinations (CHROMAGEN) capsule Take 1 capsule by mouth daily.   Yes [provider]  levothyroxine (SYNTHROID, LEVOTHROID) 88 MCG tablet Take 1 tablet (88 mcg total) by mouth daily. 05/02/18  Yes Elayne Snare, MD  meclizine (ANTIVERT) 25 MG tablet Take 1 tablet (25 mg total) by mouth 3 (three) times daily as needed for dizziness. 02/28/18  Yes Julianne Rice, MD  Multiple Vitamin (MULTIVITAMIN WITH MINERALS) TABS tablet Take 1 tablet by mouth daily.   Yes [provider]      Allergies  Allergen Reactions  . Topamax [Topiramate]     Nausea and vomiting  . Contrast Media [Iodinated Diagnostic Agents] Rash  . Iohexol Nausea And Vomiting    Pt. Vomited immediately after injection of Omni 350.    . Vicodin  [Hydrocodone-Acetaminophen] Rash    Tolerates plain Tylenol    ROS:  Out of a complete 14 system review of symptoms, the patient complains only of the following symptoms, and all other reviewed systems are negative.  Frequency of urination Headache, dizziness  Blood pressure 132/87, pulse 83, height 5\' 4"  (1.626 m), weight 152 lb (68.9 kg).  Physical Exam  General: The patient is alert and cooperative at the time of the examination.  Eyes: Pupils are equal, round, and reactive to light. Discs are flat bilaterally.  Neck: The neck is supple, no carotid bruits are noted.  Respiratory: The respiratory examination is clear.  Cardiovascular: The cardiovascular examination reveals a regular rate and rhythm, no obvious murmurs or rubs are noted.  Skin: Extremities are without significant edema.  Neurologic Exam  Mental status: The patient is alert and oriented x 3 at the time of the examination. The patient has apparent normal recent and remote memory, with an apparently normal attention span and concentration ability.  Cranial nerves: Facial symmetry is present. There is good sensation of the face to pinprick and soft touch bilaterally. The strength of the facial muscles and the muscles to head turning and shoulder shrug are normal bilaterally. Speech is well enunciated, no aphasia or dysarthria is noted. Extraocular movements are full. Visual fields are full. The tongue is midline, and the patient has symmetric elevation of the soft palate. No obvious hearing deficits are noted.  Motor: The motor testing reveals 5 over 5 strength of all 4 extremities. Good symmetric motor tone is noted throughout.  Sensory: Sensory testing is intact to pinprick, soft touch and vibration sensation on all 4 extremities. No evidence of extinction is noted.  Coordination: Cerebellar testing reveals good finger-nose-finger and heel-to-shin bilaterally.  Gait and station: Gait is normal. Tandem gait is  normal. Romberg is negative. No drift is seen.  Reflexes: Deep tendon reflexes are symmetric and normal bilaterally. Toes are downgoing bilaterally.   MRI brain 02/18/18:  IMPRESSION: Stable and negative brain MRI.  * MRI scan images were reviewed online. I agree with the written report.    Assessment/Plan:  1.  History of seizures  2.  Headache, possible migraine  3.  Episodic lightheaded sensations, dizziness  4.  Hypokalemia associated with generalized weakness and dysarthria  The patient is having relatively frequent headaches that may last 2 or 3 days.  She does clearly relate the dizziness episodes with headache.  The patient will be placed on propranolol taking 20 mg twice daily, she will call for any dose adjustments.  The patient will be placed back on Keppra taking 250 mg twice daily.  The patient will follow-up in 6 months.  Jill Alexanders MD 05/21/2018 8:23 AM  [facility] [address] [place], [place] [address]  Phone [phone] Fax [phone]

## 2018-06-25 ENCOUNTER — Encounter: Payer: Self-pay | Admitting: Endocrinology

## 2018-06-25 ENCOUNTER — Ambulatory Visit: Payer: 59 | Admitting: Endocrinology

## 2018-06-25 ENCOUNTER — Other Ambulatory Visit: Payer: Self-pay

## 2018-06-25 VITALS — BP 124/82 | HR 80 | Ht 64.0 in | Wt 154.0 lb

## 2018-06-25 DIAGNOSIS — E890 Postprocedural hypothyroidism: Secondary | ICD-10-CM

## 2018-06-25 LAB — T4, FREE: FREE T4: 1.05 ng/dL (ref 0.60–1.60)

## 2018-06-25 LAB — TSH: TSH: 0.74 u[IU]/mL (ref 0.35–4.50)

## 2018-06-25 MED ORDER — LEVOTHYROXINE SODIUM 88 MCG PO TABS: 88.0000 ug | ORAL_TABLET | Freq: Every day | ORAL | 3 refills | Status: DC

## 2018-06-25 NOTE — Progress Notes (Signed)
Patient ID: [name], female   DOB: 07-21-1965, 53 y.o.   MRN: [id number]            Reason for Appointment:  Hypothyroidism, follow-up visit    History of Present Illness:   Background information: She was treated for hyperthyroidism in 05/2014; this was diagnosed when she was admitted to the hospital with nausea, weight loss of 20-25 pounds and constipation.  She was then found to have significant tachycardia and Hyperthyroidism  was diagnosed She got 15 mCi in 06/2014 and subsequently became hypothyroid, details of when she first went on thyroid supplements are unknown. She was initially treated with 75 g of levothyroxine Subsequently been TSH was 11 this was increased to 112 g which however brought her TSH down to 0.11 She was then started on 100 g of levothyroxine In 04/2015 she had an episode of syncope along with symptoms of nausea and was evaluated in an emergency room in [place] where she was found to have a TSH of 0.15 Subsequently had been on 50 g of levothyroxine which caused her to have significant fatigue, nausea and dizziness  RECENT history: Previously her TSH was 25.7 her Synthroid was increased from 50 up to 88 g in  05/2015 Her dose was reduced to 75 g in 07/2016 Levothyroxine was increased by half a tablet weekly in 10/18 when her TSH was 5 Subsequently has been taking between 75 and 88 mcg  She is now taking 88 mcg 1 tablet daily since 04/2018  She does not feel any fatigue or unusual tiredness recently although right after her visit she was having some nausea and not feeling well No recent cold intolerance.  Her weight seems to fluctuate No skin or hair problems  She is very consistent with taking her levothyroxine in the mornings without any other supplements She has not missed any doses recently  Her dose was increased in 1/20 when her TSH was 6.1, labs pending  Patient's weight history is as follows:  Wt Readings from Last 3 Encounters:  06/25/18  154 lb (69.9 kg)  05/21/18 152 lb (68.9 kg)  05/02/18 159 lb 12.8 oz (72.5 kg)    Thyroid function results as follows:  Lab Results  Component Value Date   TSH 6.12 (H) 04/29/2018   TSH 1.469 02/19/2018   TSH 0.49 10/26/2017   FREET4 0.79 04/29/2018   FREET4 1.29 10/26/2017   FREET4 1.23 04/27/2017    Lab Results  Component Value Date   TSH 6.12 (H) 04/29/2018   TSH 1.469 02/19/2018   TSH 0.49 10/26/2017   TSH 0.65 04/27/2017   TSH 5.03 (H) 01/25/2017   TSH 0.42 07/21/2016   TSH 1.86 01/17/2016   TSH 0.96 06/10/2015   TSH 25.67 (H) 05/11/2015     Past Medical History:  Diagnosis Date  . Asthma   . Common migraine with intractable migraine 08/08/2016  . Enlarged ovary    right CT 05/2014  . Hypertension   . Hyperthyroidism   . Seizure disorder ([place]) 08/08/2016  . Seizures ([place])   . 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 Vertigo     Past Surgical History:  Procedure Laterality Date  . ABDOMINAL HYSTERECTOMY    . BALLOON DILATION N/A 05/26/2014   Procedure: BALLOON DILATION OF THE PYLORUS;  Surgeon: Danie Binder, MD;  [facility];  Service: Endoscopy;  Laterality: N/A;  . ESOPHAGOGASTRODUODENOSCOPY N/A 05/26/2014   Procedure: ESOPHAGOGASTRODUODENOSCOPY (EGD);  Surgeon: Danie Binder, MD;  [facility];  Service: Endoscopy;  Laterality: N/A;  . OVARIAN CYST SURGERY      Family History  Problem Relation Age of Onset  . Diabetes Mother   . Hypertension Mother   . Thyroid disease Mother   . Seizures Mother   . Diabetes Father   . Hypertension Father   . Thyroid disease Sister   . Thyroid disease Brother   . Seizures Brother   . Cancer Maternal Aunt        ovarian    Social History:  reports that she has never smoked. She has never used smokeless tobacco. She reports that she does not drink alcohol or use drugs.  Allergies:  Allergies  Allergen Reactions  . Topamax [Topiramate]     Nausea and vomiting  . Contrast Media [Iodinated Diagnostic  Agents] Rash  . Iohexol Nausea And Vomiting    Pt. Vomited immediately after injection of Omni 350.    . Vicodin [Hydrocodone-Acetaminophen] Rash    Tolerates plain Tylenol    Allergies as of 06/25/2018      Reactions   Topamax [topiramate]    Nausea and vomiting   Contrast Media [iodinated Diagnostic Agents] Rash   Iohexol Nausea And Vomiting   Pt. Vomited immediately after injection of Omni 350.     Vicodin [hydrocodone-acetaminophen] Rash   Tolerates plain Tylenol      Medication List       Accurate as of June 25, 2018  8:23 AM. Always use your most recent med list.        chromagen capsule Take 1 capsule by mouth daily.   COD LIVER OIL PO Take 2 tablets by mouth daily.   estradiol 0.05 mg/24hr patch Commonly known as:  CLIMARA - Dosed in mg/24 hr APPLY ONCE A WEEK   levETIRAcetam 250 MG tablet Commonly known as:  Keppra Take 1 tablet (250 mg total) by mouth 2 (two) times daily.   levothyroxine 88 MCG tablet Commonly known as:  SYNTHROID, LEVOTHROID Take 1 tablet (88 mcg total) by mouth daily.   meclizine 25 MG tablet Commonly known as:  ANTIVERT Take 1 tablet (25 mg total) by mouth 3 (three) times daily as needed for dizziness.   multivitamin with minerals Tabs tablet Take 1 tablet by mouth daily.   propranolol 20 MG tablet Commonly known as:  INDERAL Take 1 tablet (20 mg total) by mouth 2 (two) times daily.          Review of Systems    She has long-standing history of seizures, on treatment  She is having good relief of her hot flashes :being treated with HRT patches by gynecologist               Examination:    BP 124/82 (BP Location: Left Arm, Patient Position: Sitting, Cuff Size: Normal)   Pulse 80   Ht 5\' 4"  (1.626 m)   Wt 154 lb (69.9 kg)   SpO2 99%   BMI 26.43 kg/m   She looks well Thyroid not palpable Biceps reflexes show normal relaxation Skin appears normal   Assessment:  HYPOTHYROIDISM, secondary to radioactive  iodine treatment Graves' disease in early 2016 She has been on variable doses of levothyroxine in the past More recently has been taking 88 mcg  Not clear why she is requiring variable doses and discussed that this may be normal However may consider brand-name medication if she continues to have fluctuation  She is doing subjectively fairly well since her dosage change 2  months ago Has had some weight change  She has been taking her levothyroxine very consistently as directed without her vitamins or iron at the same time  TSH pending  Menopausal symptoms: Well controlled with Climara and she can continue to follow-up with gynecologist for this   PLAN:   Check labs and decide on dosage Patient wants 90-day supply   Elayne Snare 06/25/2018, 8:23 AM

## 2018-06-25 NOTE — Progress Notes (Signed)
Please call to let patient know that the lab results are normal.  Okay to send 90-day prescription for levothyroxine 88 mcg

## 2018-06-25 NOTE — Addendum Note (Signed)
Addended by: Kaylyn Lim I on: 06/25/2018 08:40 AM   Modules accepted: Orders

## 2018-08-13 ENCOUNTER — Other Ambulatory Visit: Payer: Self-pay | Admitting: Neurology

## 2018-08-22 ENCOUNTER — Ambulatory Visit: Payer: 59 | Admitting: Neurology

## 2018-10-29 ENCOUNTER — Emergency Department (HOSPITAL_COMMUNITY)
Admission: EM | Admit: 2018-10-29 | Discharge: 2018-10-29 | Disposition: A | Discharge status: Home / Self Care | Payer: 59

## 2018-10-29 NOTE — ED Triage Notes (Signed)
Pt says she has had this called her husband to pick her up.  Says she has had this episode several times and know what it is.  Says she is much better and was to go home.

## 2018-11-04 ENCOUNTER — Telehealth: Payer: Self-pay | Admitting: Neurology

## 2018-11-04 NOTE — Telephone Encounter (Signed)
Blood work results were sent over from the primary care physician, done on 23 October 2018. White blood count was 4.3, hemoglobin of 13.5, hematocrit 40.7, MCV of 91.4, platelets of 178.  BUN was 16, creatinine of 0.91, sodium 141, potassium 4.0, chloride 104, CO2 30, total protein 7.3, albumin 4.6, liver profile was unremarkable.  CPK level of 44, C-reactive protein 1.

## 2018-11-11 ENCOUNTER — Telehealth: Payer: Self-pay | Admitting: Neurology

## 2018-11-11 MED ORDER — LEVETIRACETAM 250 MG PO TABS: 250.0000 mg | ORAL_TABLET | Freq: Two times a day (BID) | ORAL | 3 refills | Status: AC

## 2018-11-11 NOTE — Telephone Encounter (Signed)
Rx has been submitted to [facility]

## 2018-11-11 NOTE — Telephone Encounter (Signed)
Pt is asking for a refill on her levETIRAcetam (KEPPRA) 250 MG tablet [id number]

## 2018-11-11 NOTE — Addendum Note (Signed)
Addended by: Verlin Grills T on: 11/11/2018 08:44 AM   Modules accepted: Orders

## 2018-11-19 ENCOUNTER — Ambulatory Visit: Payer: 59 | Admitting: Neurology

## 2018-11-24 NOTE — Progress Notes (Signed)
Virtual Visit via Video Note   This visit type was conducted due to national recommendations for restrictions regarding the COVID-19 Pandemic (e.g. social distancing) in an effort to limit this patient's exposure and mitigate transmission in our community.  Due to her co-morbid illnesses, this patient is at least at moderate risk for complications without adequate follow up.  This format is felt to be most appropriate for this patient at this time.  All issues noted in this document were discussed and addressed.  A limited physical exam was performed with this format.  Please refer to the patient's chart for her consent to telehealth for [facility].   Date:  11/24/2018   ID:  Darlen Dawson, DOB 1965/04/12, MRN [id number]  Patient Location: Home Provider Location: Office  PCP:  Alroy Dust, Carlean Jews.Marlou Sa, MD  Cardiologist:  No primary care provider on file.  Electrophysiologist:  None   Evaluation Performed:  Consultation -   Chief Complaint:  Wants to make sure she has no heart problem.  History of Present Illness:   [name] is a 53 y.o. female with Numbness, tongue tingling, dizziness, lightheaded, and nausea.. Burping and weakness.  No h/o heart disease. Worried she is trying to have a stroke. Prior neurological complaints associated with low potassium. Had hemiparesis and speech impairment. Resolved with potassium.  H/O hypothyyroidism, fatigue, and worried there is a heart problem.  With extensive probing and questioning, I am unable to uncover any complaints that sound cardiac.  She does complain of exertional fatigue.  I was repeated asked if Sickle Cell Trait could cause her complaints.  The patient does not have symptoms concerning for COVID-19 infection (fever, chills, cough, or new shortness of breath).    Past Medical History:  Diagnosis Date  . Asthma   . Common migraine with intractable migraine 08/08/2016  . Enlarged ovary    right CT 05/2014  . Hypertension   .  Hyperthyroidism   . Seizure disorder ([place]) 08/08/2016  . Seizures ([place])   . 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 Vertigo    Past Surgical History:  Procedure Laterality Date  . ABDOMINAL HYSTERECTOMY    . BALLOON DILATION N/A 05/26/2014   Procedure: BALLOON DILATION OF THE PYLORUS;  Surgeon: Danie Binder, MD;  [facility];  Service: Endoscopy;  Laterality: N/A;  . ESOPHAGOGASTRODUODENOSCOPY N/A 05/26/2014   Procedure: ESOPHAGOGASTRODUODENOSCOPY (EGD);  Surgeon: Danie Binder, MD;  [facility];  Service: Endoscopy;  Laterality: N/A;  . OVARIAN CYST SURGERY       No outpatient medications have been marked as taking for the 11/26/18 encounter (Appointment) with Belva Crome, MD.     Allergies:   Topamax [topiramate], Contrast media [iodinated diagnostic agents], Iohexol, and Vicodin [hydrocodone-acetaminophen]   Social History   Tobacco Use  . Smoking status: Never Smoker  . Smokeless tobacco: Never Used  Substance Use Topics  . Alcohol use: No  . Drug use: No     Family Hx: The patient's family history includes Cancer in her maternal aunt; Diabetes in her father and mother; Hypertension in her father and mother; Seizures in her brother and mother; Thyroid disease in her brother, mother, and sister.  ROS:   Please see the history of present illness.    Has difficulty sleeping.  Husband says she does not sleep.  She has no ankle edema.  Denies orthopnea and PND.  No palpitations, syncope, or cardiac complaints. All other systems reviewed and are negative.   Prior  CV studies:   The following studies were reviewed today:  No CV testing is been done that I am aware for able to identify.  Labs/Other Tests and Data Reviewed:    EKG:  Performed in November 2019 at [facility] reveals sinus rhythm and otherwise unremarkable in appearance.  Recent Labs: 02/28/2018: ALT 12; BUN 12; Creatinine, Ser 0.78; Hemoglobin 13.0; Magnesium 1.8; Platelets 191; Potassium  4.1; Sodium 140 06/25/2018: TSH 0.74   Recent Lipid Panel Lab Results  Component Value Date/Time   CHOL 118 06/02/2014 03:07 AM   TRIG 63 06/02/2014 03:07 AM   HDL 20 (L) 06/02/2014 03:07 AM   CHOLHDL 5.9 06/02/2014 03:07 AM   LDLCALC 85 06/02/2014 03:07 AM    Wt Readings from Last 3 Encounters:  06/25/18 154 lb (69.9 kg)  05/21/18 152 lb (68.9 kg)  05/02/18 159 lb 12.8 oz (72.5 kg)     Objective:    Vital Signs:  There were no vitals taken for this visit.   VITAL SIGNS:  reviewed GEN:  no acute distress EYES:  sclerae anicteric, EOMI - Extraocular Movements Intact RESPIRATORY:  normal respiratory effort, symmetric expansion CARDIOVASCULAR:  no peripheral edema  ASSESSMENT & PLAN:    1. Fatigue, unspecified type   2. Essential hypertension   3. Tachycardia   4. Weight loss    PLAN:  1. To summarize, the patient has multiple vague complaints.  None sound cardiac.  I am not able to do a proper assessment and the current setting.  She will need to be rescheduled for an in person office visit sometime over the next 4 to 6 weeks.  Her complaints are more neurological sounding than cardiovascular.  At a minimum I need to auscultate and evaluate her current EKG.  COVID-19 Education: The signs and symptoms of COVID-19 were discussed with the patient and how to seek care for testing (follow up with PCP or arrange E-visit).  The importance of social distancing was discussed today.  Time:   Today, I have spent 18 minutes with the patient with telehealth technology discussing the above problems.     Medication Adjustments/Labs and Tests Ordered: Current medicines are reviewed at length with the patient today.  Concerns regarding medicines are outlined above.   Tests Ordered: No orders of the defined types were placed in this encounter.   Medication Changes: No orders of the defined types were placed in this encounter.   Follow Up:  In Person in 6 week(s)  Signed, Sinclair Grooms, MD  11/24/2018 4:15 PM    [facility]

## 2018-11-25 ENCOUNTER — Other Ambulatory Visit: Payer: Self-pay | Admitting: *Deleted

## 2018-11-25 ENCOUNTER — Telehealth: Payer: Self-pay | Admitting: *Deleted

## 2018-11-25 NOTE — Telephone Encounter (Signed)

## 2018-11-26 ENCOUNTER — Encounter: Payer: Self-pay | Admitting: Interventional Cardiology

## 2018-11-26 ENCOUNTER — Telehealth (INDEPENDENT_AMBULATORY_CARE_PROVIDER_SITE_OTHER): Payer: 59 | Admitting: Interventional Cardiology

## 2018-11-26 ENCOUNTER — Other Ambulatory Visit: Payer: Self-pay

## 2018-11-26 VITALS — BP 138/83 | HR 72 | Ht 64.0 in | Wt 149.0 lb

## 2018-11-26 DIAGNOSIS — R5383 Other fatigue: Secondary | ICD-10-CM

## 2018-11-26 DIAGNOSIS — I10 Essential (primary) hypertension: Secondary | ICD-10-CM

## 2018-11-26 DIAGNOSIS — R634 Abnormal weight loss: Secondary | ICD-10-CM

## 2018-11-26 DIAGNOSIS — R000 Tachycardia, unspecified: Secondary | ICD-10-CM

## 2018-11-26 NOTE — Patient Instructions (Signed)
Medication Instructions:  Your physician recommends that you continue on your current medications as directed. Please refer to the Current Medication list given to you today.  If you need a refill on your cardiac medications before your next appointment, please call your pharmacy.   Lab work: None If you have labs (blood work) drawn today and your tests are completely normal, you will receive your results only by: . MyChart Message (if you have MyChart) OR . A paper copy in the mail If you have any lab test that is abnormal or we need to change your treatment, we will call you to review the results.  Testing/Procedures: None  Follow-Up: At [facility], you and your health needs are our priority.  As part of our continuing mission to provide you with exceptional heart care, we have created designated Provider Care Teams.  These Care Teams include your primary Cardiologist (physician) and Advanced Practice Providers (APPs -  Physician Assistants and Nurse Practitioners) who all work together to provide you with the care you need, when you need it. You will need a follow up appointment in 4-6 weeks.  Please call our office 2 months in advance to schedule this appointment.  You may see Dr. Smith or one of the following Advanced Practice Providers on your designated Care Team:   Lori Gerhardt, NP Laura Ingold, NP . Jill McDaniel, NP  Any Other Special Instructions Will Be Listed Below (If Applicable).    

## 2018-11-27 NOTE — Progress Notes (Signed)
PATIENT: [name] DOB: 03/11/1966  REASON FOR VISIT: follow up HISTORY FROM: patient  HISTORY OF PRESENT ILLNESS: Today 11/28/18  [name] is a 53 year old female with history of seizures. She reports her last seizure was in April 2018.  In the past she has been unable to tolerate Topamax or Zonegran. In November 2019 she went to the hospital after onset of dysarthria and generalized weakness, was found to have hypokalemia.  Replacement of the potassium was helpful.  She then returned to the hospital a few days later with dizziness.  She has been seen by ENT, was felt to have a vestibular migraine, versus true vertigo.  She continues to report episodes of dizziness, feeling nauseated, lightheaded, usually associated with headache.  The episodes may be random, could last a few minutes, or several days.  She also has fatigue. She says for the last 2 weeks, she has not been to work because she has episodes of dizziness and headaches. There are no symptoms of weakness or numbness in her arms or legs. She may have a tingling sensation in the tip of her tongue on occasion.  She denies any chest pain or palpitations with the episodes.  At her last visit, she was prescribed propanolol to take for the headache and dizziness episodes, but she only took medication for a few days then stopped.  She also stopped taking Keppra.  She only recently started Keppra back about a week ago.  She does report that she is from [place], and that her mother has these similar episodes.  She also wonders if she could have sickle cell trait, but she has been tested before and has been negative. She works at [employer].  She presents for follow-up accompanied by her husband.  HISTORY 05/21/2018 Dr. Jannifer Franklin: [name] is a 53 year old right-handed black female with a history of seizures, the last seizure she claims that she has had was in April 2018.  The patient has been on low-dose Keppra, she was unable to tolerate Topamax or  Zonegran previously.  For some reason, the patient has stopped Keppra, she is not able to give me an explanation as to when or why, she appeared to be tolerating the drug fairly well.  The patient was seen in the emergency room and admitted to the hospital on 18 February 2018 with onset of dysarthria and generalized weakness, she was found to have hypokalemia, replacement of the potassium was helpful.  The patient has subsequently been taken off of her diuretic, the patient has had episodes similar to the above with hypokalemia in the past.  The potassium level was 2.7.  The patient returned to the emergency room on 28 February 2018 with dizziness, the patient denies true vertigo.  The patient has had episodes of dizziness that may be random lasting a minute or so, the patient associates the episodes of dizziness with headaches that may last up to 3 days.  The headaches are associated with a bandlike pressure sensation around the head that may come and go.  The patient has been seen through [facility] through ENT with an evaluation for the dizziness, she was not felt to have true vertigo.  The patient does not take any medications for her headaches.  The patient still operates a motor vehicle.  She has seen her primary care doctor about these issues, she had a telephone visit with cardiology yesterday. She has not had any recent seizures.  REVIEW OF SYSTEMS: Out of a complete 14 system  review of symptoms, the patient complains only of the following symptoms, and all other reviewed systems are negative.  Appetite change, activity change, fatigue, light sensitivity, blurred vision, constipation, nausea, insomnia, frequency of urination, anemia, dizziness, headache, numbness, weakness  ALLERGIES: Allergies  Allergen Reactions  . Topamax [Topiramate]     Nausea and vomiting  . Contrast Media [Iodinated Diagnostic Agents] Rash  . Iohexol Nausea And Vomiting    Pt. Vomited immediately after injection of Omni 350.     . Vicodin [Hydrocodone-Acetaminophen] Rash    Tolerates plain Tylenol    HOME MEDICATIONS: Outpatient Medications Prior to Visit  Medication Sig Dispense Refill  . estradiol (CLIMARA - DOSED IN MG/24 HR) 0.05 mg/24hr patch APPLY ONCE A WEEK  2  . Iron Combinations (CHROMAGEN) capsule Take 1 capsule by mouth daily.    Marland Kitchen levETIRAcetam (KEPPRA) 250 MG tablet Take 1 tablet (250 mg total) by mouth 2 (two) times daily. 180 tablet 3  . levothyroxine (SYNTHROID, LEVOTHROID) 88 MCG tablet Take 1 tablet (88 mcg total) by mouth daily before breakfast. 90 tablet 3  . lisinopril (ZESTRIL) 10 MG tablet Take 10 mg by mouth daily.    . Multiple Vitamin (MULTIVITAMIN WITH MINERALS) TABS tablet Take 1 tablet by mouth daily.    . COD LIVER OIL PO Take 2 tablets by mouth daily.    . propranolol (INDERAL) 20 MG tablet TAKE 1 TABLET BY MOUTH TWICE A DAY (Patient not taking: Reported on 11/28/2018) 180 tablet 1   No facility-administered medications prior to visit.     PAST MEDICAL HISTORY: Past Medical History:  Diagnosis Date  . Asthma   . Common migraine with intractable migraine 08/08/2016  . Enlarged ovary    right CT 05/2014  . Hypertension   . Hyperthyroidism   . Seizure disorder ([place]) 08/08/2016  . Seizures ([place])   . 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 Vertigo     PAST SURGICAL HISTORY: Past Surgical History:  Procedure Laterality Date  . ABDOMINAL HYSTERECTOMY    . BALLOON DILATION N/A 05/26/2014   Procedure: BALLOON DILATION OF THE PYLORUS;  Surgeon: Danie Binder, MD;  [facility];  Service: Endoscopy;  Laterality: N/A;  . ESOPHAGOGASTRODUODENOSCOPY N/A 05/26/2014   Procedure: ESOPHAGOGASTRODUODENOSCOPY (EGD);  Surgeon: Danie Binder, MD;  [facility];  Service: Endoscopy;  Laterality: N/A;  . OVARIAN CYST SURGERY      FAMILY HISTORY: Family History  Problem Relation Age of Onset  . Diabetes Mother   . Hypertension Mother   . Thyroid disease Mother   . Seizures  Mother   . Diabetes Father   . Hypertension Father   . Thyroid disease Sister   . Thyroid disease Brother   . Seizures Brother   . Cancer Maternal Aunt        ovarian    SOCIAL HISTORY: Social History   Socioeconomic History  . Marital status: Married    Spouse name: Not on file  . Number of children: 2  . Years of education: HS  . Highest education level: Not on file  Occupational History  . Not on file  Social Needs  . Financial resource strain: Not on file  . Food insecurity    Worry: Not on file    Inability: Not on file  . Transportation needs    Medical: Not on file    Non-medical: Not on file  Tobacco Use  . Smoking status: Never Smoker  . Smokeless tobacco: Never Used  Substance and Sexual Activity  . Alcohol use: No  . Drug use: No  . Sexual activity: Yes    Birth control/protection: Surgical  Lifestyle  . Physical activity    Days per week: Not on file    Minutes per session: Not on file  . Stress: Not on file  Relationships  . Social Herbalist on phone: Not on file    Gets together: Not on file    Attends religious service: Not on file    Active member of club or organization: Not on file    Attends meetings of clubs or organizations: Not on file    Relationship status: Not on file  . Intimate partner violence    Fear of current or ex partner: Not on file    Emotionally abused: Not on file    Physically abused: Not on file    Forced sexual activity: Not on file  Other Topics Concern  . Not on file  Social History Narrative   Caffeine: 1-2 times a week       PHYSICAL EXAM  Vitals:   11/28/18 0823  BP: (!) 143/96  Pulse: 83  Temp: 97.7 F (36.5 C)  TempSrc: Oral  Weight: 151 lb 6.4 oz (68.7 kg)  Height: 5\' 4"  (1.626 m)   Body mass index is 25.99 kg/m.  Generalized: Well developed, in no acute distress   Neurological examination  Mentation: Alert oriented to time, place, history taking. Follows all commands speech and  language fluent Cranial nerve II-XII: Pupils were equal round reactive to light. Extraocular movements were full, visual field were full on confrontational test. Facial sensation and strength were normal.  Head turning and shoulder shrug  were normal and symmetric. Motor: The motor testing reveals 5 over 5 strength of all 4 extremities. Good symmetric motor tone is noted throughout.  Sensory: Sensory testing is intact to soft touch on all 4 extremities. No evidence of extinction is noted.  Coordination: Cerebellar testing reveals good finger-nose-finger and heel-to-shin bilaterally.  Gait and station: Gait is normal. Tandem gait is normal. Romberg is mildly positive.. No drift is seen.  Reflexes: Deep tendon reflexes are symmetric and normal bilaterally.   DIAGNOSTIC DATA (LABS, IMAGING, TESTING) - I reviewed patient records, labs, notes, testing and imaging myself where available.  Lab Results  Component Value Date   WBC 7.1 02/28/2018   HGB 13.0 02/28/2018   HCT 39.5 02/28/2018   MCV 90.4 02/28/2018   PLT 191 02/28/2018      Component Value Date/Time   NA 140 02/28/2018 1814   K 4.1 02/28/2018 1814   CL 112 (H) 02/28/2018 1814   CO2 21 (L) 02/28/2018 1814   GLUCOSE 97 02/28/2018 1814   BUN 12 02/28/2018 1814   CREATININE 0.78 02/28/2018 1814   CALCIUM 9.7 02/28/2018 1814   CALCIUM 9.5 05/23/2014 1709   PROT 7.8 02/28/2018 1814   ALBUMIN 4.3 02/28/2018 1814   AST 15 02/28/2018 1814   ALT 12 02/28/2018 1814   ALKPHOS 70 02/28/2018 1814   BILITOT 0.5 02/28/2018 1814   GFRNONAA >60 02/28/2018 1814   GFRAA >60 02/28/2018 1814   Lab Results  Component Value Date   CHOL 118 06/02/2014   HDL 20 (L) 06/02/2014   LDLCALC 85 06/02/2014   TRIG 63 06/02/2014   CHOLHDL 5.9 06/02/2014   Lab Results  Component Value Date   HGBA1C 5.7 09/10/2015   Lab Results  Component Value Date   VITAMINB12  237 02/19/2018   Lab Results  Component Value Date   TSH 0.74 06/25/2018     ASSESSMENT AND PLAN 53 y.o. year old female  has a past medical history of Asthma, Common migraine with intractable migraine (08/08/2016), Enlarged ovary, Hypertension, Hyperthyroidism, Seizure disorder ([place]) (08/08/2016), Seizures ([place]), Thyroid storm (06/09/2014), and Vertigo. here with:  1.  History of seizures 2.  Headache, possible migraine 3.  Episodic lightheaded sensations, dizziness 4.  Reported fatigue  She reports she is having frequent headaches, 4 to 5 days a week.  The headaches are usually associated with a sensation of dizziness, and lightheadedness.  She will start taking the medications that were prescribed after her last visit, including propanolol 20 mg twice a day, and Keppra 250 mg twice a day.  She should stay on these medications consistently for several weeks to see if her episodes improve. Her symptoms sound, as if they could be a component of migraine.  I have given her a prescription for meclizine to take as needed in the event the dizziness is quite intense.  She had a telephone consultation with cardiology yesterday, will be having an office visit in the upcoming weeks.  She tells me that the symptoms are not new, have been going on for months.  She does follow regularly with endocrinology for subsequent hypothyroidism after treatment for hyperthyroidism.  She had MRI of the brain 02/18/2018, that was unremarkable.  She does mention that her mother who lives in [place] has similar episodes of headache, dizziness, lightheaded sensation.  She will let me know if her symptoms do not improve in a couple weeks with the medications.  She will follow-up in this office in 3 to 4 months with Dr. Jannifer Franklin or sooner if needed.  I did advise that if her symptoms worsen or she develops any new symptoms she should let us know.  I spent 25 minutes with the patient. 50% of this time was spent discussing her plan of care.   Butler Denmark, AGNP-C, DNP 11/28/2018, 9:53 AM [facility] [address], [place], [place] [address] [phone]

## 2018-11-28 ENCOUNTER — Ambulatory Visit: Payer: 59 | Admitting: Neurology

## 2018-11-28 ENCOUNTER — Other Ambulatory Visit: Payer: Self-pay

## 2018-11-28 ENCOUNTER — Encounter: Payer: Self-pay | Admitting: Neurology

## 2018-11-28 VITALS — BP 143/96 | HR 83 | Temp 97.7°F | Ht 64.0 in | Wt 151.4 lb

## 2018-11-28 DIAGNOSIS — G40909 Epilepsy, unspecified, not intractable, without status epilepticus: Secondary | ICD-10-CM | POA: Diagnosis not present

## 2018-11-28 DIAGNOSIS — G43019 Migraine without aura, intractable, without status migrainosus: Secondary | ICD-10-CM | POA: Diagnosis not present

## 2018-11-28 MED ORDER — MECLIZINE HCL 25 MG PO TABS: 25.0000 mg | ORAL_TABLET | Freq: Three times a day (TID) | ORAL | 0 refills | Status: DC | PRN

## 2018-11-28 NOTE — Progress Notes (Signed)
I have read the note, and I agree with the clinical assessment and plan.  Charles K Willis   

## 2018-11-28 NOTE — Patient Instructions (Signed)
Please continue taking Keppra and propranolol consistently for a few weeks. If the symptoms are not better, please call and let us know. I will give you a rx for meclizine to take as needed for dizziness. We will see you back in 2-3 months.

## 2018-12-10 ENCOUNTER — Telehealth: Payer: Self-pay | Admitting: Neurology

## 2018-12-10 MED ORDER — PROPRANOLOL HCL 20 MG PO TABS: 40.0000 mg | ORAL_TABLET | Freq: Two times a day (BID) | ORAL | 3 refills | Status: DC

## 2018-12-10 NOTE — Addendum Note (Signed)
Addended by: Kathrynn Ducking on: 12/10/2018 05:41 PM   Modules accepted: Orders

## 2018-12-10 NOTE — Telephone Encounter (Signed)
I called the patient.  The patient is now having daily headaches, she is only on 20 mg of propranolol twice daily, I would increase the dose to 40 mg twice daily and have her call back in a couple weeks if she is still not doing well.  As long she is tolerating the drug, we can push the dose a bit higher.  The propranolol is not helpful, we may try a Depakote for her headaches, Topamax and Zonegran were not tolerated previously.

## 2018-12-10 NOTE — Telephone Encounter (Signed)
Pt states she was told by NP Sarah if the current dose of propranolol (INDERAL) 20 MG tablet doesn't work to call for something stronger.  Pt states it has been 2 weeks and propranolol (INDERAL) 20 MG tablet is not helping.  Please call

## 2018-12-13 ENCOUNTER — Other Ambulatory Visit: Payer: Self-pay | Admitting: Family Medicine

## 2018-12-13 DIAGNOSIS — Z1231 Encounter for screening mammogram for malignant neoplasm of breast: Secondary | ICD-10-CM

## 2018-12-17 NOTE — Telephone Encounter (Signed)
I reached out to the pt. I advised Dr. Jannifer Franklin is currently recommending she take 2 tabs bid of the 20 mg propranolol. Pt verbalized understanding on this. She reports when she received the call from the pharmacist the message only said her 20 mg prescription was ready for pick up and she was under the impression it would be 1 40 mg tab bid. Pt will pick the 20 mg tab up and take 2 tabs bid.

## 2018-12-17 NOTE — Telephone Encounter (Signed)
Pt has called to inform that what has been recently called in for her is the same thing she has been taking -propranolol (INDERAL) 20 MG tablet  Pt was expecting the 40 mg twice daily .  Please call

## 2018-12-24 ENCOUNTER — Other Ambulatory Visit: Payer: Self-pay

## 2018-12-25 NOTE — Progress Notes (Signed)
Patient ID: [name], female   DOB: 04-27-1965, 53 y.o.   MRN: [id number]            Reason for Appointment:  Hypothyroidism, follow-up visit    History of Present Illness:   Background information: She was treated for hyperthyroidism in 05/2014; this was diagnosed when she was admitted to the hospital with nausea, weight loss of 20-25 pounds and constipation.  She was then found to have significant tachycardia and Hyperthyroidism  was diagnosed She got 15 mCi in 06/2014 and subsequently became hypothyroid, details of when she first went on thyroid supplements are unknown. She was initially treated with 75 g of levothyroxine Subsequently been TSH was 11 this was increased to 112 g which however brought her TSH down to 0.11 She was then started on 100 g of levothyroxine In 04/2015 she had an episode of syncope along with symptoms of nausea and was evaluated in an emergency room in [facility] [place] where she was found to have a TSH of 0.15 Subsequently had been on 50 g of levothyroxine which caused her to have significant fatigue, nausea and dizziness  RECENT history: Previously her TSH was 25.7 her Synthroid was increased from 50 up to 88 g in  05/2015 Her dose was reduced to 75 g in 07/2016 Levothyroxine was increased by half a tablet weekly in 10/18 when her TSH was 5 Subsequently has been taking between 75 and 88 mcg  She is now taking 88 mcg 1 tablet daily since 04/2018  She feels fairly good recently without any fatigue Previously was having 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 and is only able to control the symptoms recently No hair loss, heat or cold intolerance or significant weight change   She is very consistent with taking her levothyroxine in the mornings without any other supplements She takes her vitamins or iron later in the day However recently has taken Protonix for a few days only  She has not missed any doses of her levothyroxine  Last TSH normal  Patient's  weight history is as follows:  Wt Readings from Last 3 Encounters:  12/26/18 153 lb 9.6 oz (69.7 kg)  11/28/18 151 lb 6.4 oz (68.7 kg)  11/26/18 149 lb (67.6 kg)    Thyroid function results as follows:  Lab Results  Component Value Date   TSH 0.74 06/25/2018   TSH 6.12 (H) 04/29/2018   TSH 1.469 02/19/2018   FREET4 1.05 06/25/2018   FREET4 0.79 04/29/2018   FREET4 1.29 10/26/2017    Lab Results  Component Value Date   TSH 0.74 06/25/2018   TSH 6.12 (H) 04/29/2018   TSH 1.469 02/19/2018   TSH 0.49 10/26/2017   TSH 0.65 04/27/2017   TSH 5.03 (H) 01/25/2017   TSH 0.42 07/21/2016   TSH 1.86 01/17/2016   TSH 0.96 06/10/2015     Past Medical History:  Diagnosis Date  . Asthma   . Common migraine with intractable migraine 08/08/2016  . Enlarged ovary    right CT 05/2014  . Hypertension   . Hyperthyroidism   . Seizure disorder ([place]) 08/08/2016  . Seizures ([place])   . 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 Vertigo     Past Surgical History:  Procedure Laterality Date  . ABDOMINAL HYSTERECTOMY    . BALLOON DILATION N/A 05/26/2014   Procedure: BALLOON DILATION OF THE PYLORUS;  Surgeon: Danie Binder, MD;  [facility];  Service: Endoscopy;  Laterality: N/A;  . ESOPHAGOGASTRODUODENOSCOPY N/A 05/26/2014   Procedure:  ESOPHAGOGASTRODUODENOSCOPY (EGD);  Surgeon: Danie Binder, MD;  [facility];  Service: Endoscopy;  Laterality: N/A;  . OVARIAN CYST SURGERY      Family History  Problem Relation Age of Onset  . Diabetes Mother   . Hypertension Mother   . Thyroid disease Mother   . Seizures Mother   . Diabetes Father   . Hypertension Father   . Thyroid disease Sister   . Thyroid disease Brother   . Seizures Brother   . Cancer Maternal Aunt        ovarian    Social History:  reports that she has never smoked. She has never used smokeless tobacco. She reports that she does not drink alcohol or use drugs.  Allergies:  Allergies  Allergen Reactions  . Topamax  [Topiramate]     Nausea and vomiting  . Contrast Media [Iodinated Diagnostic Agents] Rash  . Iohexol Nausea And Vomiting    Pt. Vomited immediately after injection of Omni 350.    . Vicodin [Hydrocodone-Acetaminophen] Rash    Tolerates plain Tylenol    Allergies as of 12/26/2018      Reactions   Topamax [topiramate]    Nausea and vomiting   Contrast Media [iodinated Diagnostic Agents] Rash   Iohexol Nausea And Vomiting   Pt. Vomited immediately after injection of Omni 350.     Vicodin [hydrocodone-acetaminophen] Rash   Tolerates plain Tylenol      Medication List       Accurate as of December 26, 2018  8:52 AM. If you have any questions, ask your nurse or doctor.        chromagen capsule Take 1 capsule by mouth daily.   COD LIVER OIL PO Take 2 tablets by mouth daily.   estradiol 0.05 mg/24hr patch Commonly known as: CLIMARA - Dosed in mg/24 hr APPLY ONCE A WEEK   levETIRAcetam 250 MG tablet Commonly known as: Keppra Take 1 tablet (250 mg total) by mouth 2 (two) times daily.   levothyroxine 88 MCG tablet Commonly known as: SYNTHROID Take 1 tablet (88 mcg total) by mouth daily before breakfast.   lisinopril 10 MG tablet Commonly known as: ZESTRIL Take 10 mg by mouth daily.   meclizine 25 MG tablet Commonly known as: ANTIVERT Take 1 tablet (25 mg total) by mouth 3 (three) times daily as needed for dizziness.   multivitamin with minerals Tabs tablet Take 1 tablet by mouth daily.   propranolol 20 MG tablet Commonly known as: INDERAL Take 2 tablets (40 mg total) by mouth 2 (two) times daily.          Review of Systems    She has long-standing history of seizures, on treatment  She is having good relief of her hot flashes :being treated with HRT patches by gynecologist   Protonix 1 week              Examination:    BP 140/82 (BP Location: Left Arm, Patient Position: Sitting, Cuff Size: Normal)   Pulse (!) 56   Ht 5\' 4"  (1.626 m)   Wt 153 lb 9.6  oz (69.7 kg)   SpO2 98%   BMI 26.37 kg/m   She looks well Thyroid not palpable Biceps reflexes show normal relaxation Skin appears normal   Assessment:  HYPOTHYROIDISM, secondary to radioactive iodine treatment Graves' disease in early 2016 She has been on variable doses of levothyroxine in the past This year has been taking 88 mcg 1 tablet daily every morning  Currently she is feeling subjectively well  She has been taking her levothyroxine regularly every morning She does not take her vitamins or iron at the same time  TSH pending  Menopausal symptoms: Well controlled with Climara now prescribed by gynecologist   PLAN:   Recheck thyroid levels today If labs are normal we will see her back again in 6 months   Samari Gorby 12/26/2018, 8:52 AM

## 2018-12-26 ENCOUNTER — Encounter: Payer: Self-pay | Admitting: Endocrinology

## 2018-12-26 ENCOUNTER — Ambulatory Visit: Payer: 59 | Admitting: Endocrinology

## 2018-12-26 ENCOUNTER — Other Ambulatory Visit: Payer: Self-pay

## 2018-12-26 VITALS — BP 140/82 | HR 56 | Ht 64.0 in | Wt 153.6 lb

## 2018-12-26 DIAGNOSIS — E890 Postprocedural hypothyroidism: Secondary | ICD-10-CM | POA: Diagnosis not present

## 2018-12-26 LAB — T4, FREE: Free T4: 1.38 ng/dL (ref 0.60–1.60)

## 2018-12-26 LAB — TSH: TSH: 2.65 u[IU]/mL (ref 0.35–4.50)

## 2018-12-27 NOTE — Progress Notes (Signed)
Please call to let patient know that the lab results are normal and to stay on the same dose until next visit

## 2019-01-05 NOTE — Progress Notes (Signed)
Cardiology Office Note:    Date:  01/06/2019   ID:  [name], DOB 1965-10-13, MRN [id number]  PCP:  Alroy Dust, Carlean Jews.Marlou Sa, MD  Cardiologist:  No primary care provider on file.   Referring MD: Alroy Dust, L.Marlou Sa, MD   Chief Complaint  Patient presents with  . Advice Only    History of Present Illness:    [name] is a 53 y.o. female with a hx of Numbness, tongue tingling, dizziness, lightheaded, nausea, weakness, and burping. Initial evaluation was with Tele-health encounter, here today for IP visit for auscultation and ECG.   This is an in person visit on Mrs. [name] who was seen initially as a virtual new patient earlier this summer.  After taking a history her symptoms did not sound at any way cardiac related.  I recommended a neurology consultation.  She has seen neurology and been diagnosed with vestibular vertigo.  Therapy is been started and has led to improvement.  The therapy I believe is meclizine.  No interval development of chest pain, orthopnea, palpitations, edema, PND, syncope, or other cardiac complaints.  Past Medical History:  Diagnosis Date  . Asthma   . Common migraine with intractable migraine 08/08/2016  . Enlarged ovary    right CT 05/2014  . Hypertension   . Hyperthyroidism   . Seizure disorder ([place]) 08/08/2016  . Seizures ([place])   . 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 Vertigo     Past Surgical History:  Procedure Laterality Date  . ABDOMINAL HYSTERECTOMY    . BALLOON DILATION N/A 05/26/2014   Procedure: BALLOON DILATION OF THE PYLORUS;  Surgeon: Danie Binder, MD;  [facility];  Service: Endoscopy;  Laterality: N/A;  . ESOPHAGOGASTRODUODENOSCOPY N/A 05/26/2014   Procedure: ESOPHAGOGASTRODUODENOSCOPY (EGD);  Surgeon: Danie Binder, MD;  [facility];  Service: Endoscopy;  Laterality: N/A;  . OVARIAN CYST SURGERY      Current Medications: Current Meds  Medication Sig  . COD LIVER OIL PO Take 2 tablets by mouth daily.  Marland Kitchen estradiol  (CLIMARA - DOSED IN MG/24 HR) 0.05 mg/24hr patch APPLY ONCE A WEEK  . Iron Combinations (CHROMAGEN) capsule Take 1 capsule by mouth daily.  Marland Kitchen levETIRAcetam (KEPPRA) 250 MG tablet Take 1 tablet (250 mg total) by mouth 2 (two) times daily.  Marland Kitchen levothyroxine (SYNTHROID, LEVOTHROID) 88 MCG tablet Take 1 tablet (88 mcg total) by mouth daily before breakfast.  . lisinopril (ZESTRIL) 10 MG tablet Take 10 mg by mouth daily.  . meclizine (ANTIVERT) 25 MG tablet Take 1 tablet (25 mg total) by mouth 3 (three) times daily as needed for dizziness.  . Multiple Vitamin (MULTIVITAMIN WITH MINERALS) TABS tablet Take 1 tablet by mouth daily.  . propranolol (INDERAL) 20 MG tablet Take 2 tablets (40 mg total) by mouth 2 (two) times daily.     Allergies:   Topamax [topiramate], Contrast media [iodinated diagnostic agents], Iohexol, and Vicodin [hydrocodone-acetaminophen]   Social History   Socioeconomic History  . Marital status: Married    Spouse name: Not on file  . Number of children: 2  . Years of education: HS  . Highest education level: Not on file  Occupational History  . Not on file  Social Needs  . Financial resource strain: Not on file  . Food insecurity    Worry: Not on file    Inability: Not on file  . Transportation needs    Medical: Not on file    Non-medical: Not on file  Tobacco Use  .  Smoking status: Never Smoker  . Smokeless tobacco: Never Used  Substance and Sexual Activity  . Alcohol use: No  . Drug use: No  . Sexual activity: Yes    Birth control/protection: Surgical  Lifestyle  . Physical activity    Days per week: Not on file    Minutes per session: Not on file  . Stress: Not on file  Relationships  . Social Herbalist on phone: Not on file    Gets together: Not on file    Attends religious service: Not on file    Active member of club or organization: Not on file    Attends meetings of clubs or organizations: Not on file    Relationship status: Not on  file  Other Topics Concern  . Not on file  Social History Narrative   Caffeine: 1-2 times a week      Family History: The patient's family history includes Cancer in her maternal aunt; Diabetes in her father and mother; Hypertension in her father and mother; Seizures in her brother and mother; Thyroid disease in her brother, mother, and sister.  ROS:   Please see the history of present illness.    Improved dizziness and weakness.  All other systems reviewed and are negative.  EKGs/Labs/Other Studies Reviewed:    The following studies were reviewed today: No cardiac imaging or investigation.  EKG:  EKG performed 01/06/2019 demonstrates sinus bradycardia, normal PR interval, and otherwise normal appearance.  She is on propranolol.  Recent Labs: 02/28/2018: ALT 12; BUN 12; Creatinine, Ser 0.78; Hemoglobin 13.0; Magnesium 1.8; Platelets 191; Potassium 4.1; Sodium 140 12/26/2018: TSH 2.65  Recent Lipid Panel    Component Value Date/Time   CHOL 118 06/02/2014 0307   TRIG 63 06/02/2014 0307   HDL 20 (L) 06/02/2014 0307   CHOLHDL 5.9 06/02/2014 0307   VLDL 13 06/02/2014 0307   LDLCALC 85 06/02/2014 0307    Physical Exam:    VS:  BP 132/82   Pulse (!) 57   Ht 5\' 4"  (1.626 m)   Wt 157 lb (71.2 kg)   SpO2 99%   BMI 26.95 kg/m     Wt Readings from Last 3 Encounters:  01/06/19 157 lb (71.2 kg)  12/26/18 153 lb 9.6 oz (69.7 kg)  11/28/18 151 lb 6.4 oz (68.7 kg)     GEN: Healthy-appearing. No acute distress HEENT: Normal NECK: No JVD. LYMPHATICS: No lymphadenopathy CARDIAC:  RRR without murmur, gallop, or edema. VASCULAR:  Normal Pulses. No bruits. RESPIRATORY:  Clear to auscultation without rales, wheezing or rhonchi  ABDOMEN: Soft, non-tender, non-distended, No pulsatile mass, MUSCULOSKELETAL: No deformity  SKIN: Warm and dry NEUROLOGIC:  Alert and oriented x 3 PSYCHIATRIC:  Normal affect   ASSESSMENT:    1. Essential hypertension   2. Weakness   3. Numbness and  tingling   4. Educated About Covid-19 Virus Infection    PLAN:    In order of problems listed above:  1. Excellent blood pressure recording today. 2. Improved on meclizine 3. Improved on meclizine 4. Handwashing, social distancing, and masking is emphasized.  I do not believe cardiac evaluation is necessary.  PRN follow-up if needed.  Continue to follow-up with neurology.   Medication Adjustments/Labs and Tests Ordered: Current medicines are reviewed at length with the patient today.  Concerns regarding medicines are outlined above.  No orders of the defined types were placed in this encounter.  No orders of the defined types were placed in  this encounter.   There are no Patient Instructions on file for this visit.   Signed, Sinclair Grooms, MD  01/06/2019 12:44 PM    [place]

## 2019-01-06 ENCOUNTER — Other Ambulatory Visit: Payer: Self-pay

## 2019-01-06 ENCOUNTER — Encounter: Payer: Self-pay | Admitting: Interventional Cardiology

## 2019-01-06 ENCOUNTER — Ambulatory Visit: Payer: 59 | Admitting: Interventional Cardiology

## 2019-01-06 VITALS — BP 132/82 | HR 57 | Ht 64.0 in | Wt 157.0 lb

## 2019-01-06 DIAGNOSIS — I10 Essential (primary) hypertension: Secondary | ICD-10-CM | POA: Diagnosis not present

## 2019-01-06 DIAGNOSIS — Z7189 Other specified counseling: Secondary | ICD-10-CM | POA: Diagnosis not present

## 2019-01-06 DIAGNOSIS — R200 Anesthesia of skin: Secondary | ICD-10-CM | POA: Diagnosis not present

## 2019-01-06 DIAGNOSIS — R531 Weakness: Secondary | ICD-10-CM | POA: Diagnosis not present

## 2019-01-06 DIAGNOSIS — R202 Paresthesia of skin: Secondary | ICD-10-CM

## 2019-01-06 NOTE — Patient Instructions (Signed)
Medication Instructions:  Your physician recommends that you continue on your current medications as directed. Please refer to the Current Medication list given to you today.  If you need a refill on your cardiac medications before your next appointment, please call your pharmacy.   Lab work: None If you have labs (blood work) drawn today and your tests are completely normal, you will receive your results only by: . MyChart Message (if you have MyChart) OR . A paper copy in the mail If you have any lab test that is abnormal or we need to change your treatment, we will call you to review the results.  Testing/Procedures: None  Follow-Up: Your physician recommends that you schedule a follow-up appointment as needed with Dr. Smith.   Any Other Special Instructions Will Be Listed Below (If Applicable).    

## 2019-01-30 ENCOUNTER — Other Ambulatory Visit: Payer: Self-pay

## 2019-01-30 ENCOUNTER — Ambulatory Visit
Admission: RE | Admit: 2019-01-30 | Discharge: 2019-01-30 | Disposition: A | Discharge status: Home / Self Care | Payer: 59 | Source: Ambulatory Visit | Attending: Family Medicine | Admitting: Family Medicine

## 2019-01-30 DIAGNOSIS — Z1231 Encounter for screening mammogram for malignant neoplasm of breast: Secondary | ICD-10-CM

## 2019-03-05 ENCOUNTER — Other Ambulatory Visit: Payer: Self-pay

## 2019-03-05 ENCOUNTER — Encounter: Payer: Self-pay | Admitting: Neurology

## 2019-03-05 ENCOUNTER — Ambulatory Visit: Payer: 59 | Admitting: Neurology

## 2019-03-05 VITALS — BP 140/94 | HR 62 | Temp 97.4°F | Wt 158.0 lb

## 2019-03-05 DIAGNOSIS — G40909 Epilepsy, unspecified, not intractable, without status epilepticus: Secondary | ICD-10-CM | POA: Diagnosis not present

## 2019-03-05 DIAGNOSIS — G43019 Migraine without aura, intractable, without status migrainosus: Secondary | ICD-10-CM

## 2019-03-05 MED ORDER — MECLIZINE HCL 25 MG PO TABS: 25.0000 mg | ORAL_TABLET | Freq: Three times a day (TID) | ORAL | 5 refills | Status: DC | PRN

## 2019-03-05 MED ORDER — PROPRANOLOL HCL ER 120 MG PO CP24: 120.0000 mg | ORAL_CAPSULE | Freq: Every day | ORAL | 3 refills | Status: DC

## 2019-03-05 NOTE — Patient Instructions (Signed)
We will go up on the propranolol to 120 mg LA capsule one a day.

## 2019-03-05 NOTE — Progress Notes (Signed)
Reason for visit: History of seizures, migraine headache  [name] is an 53 y.o. female  History of present illness:  Ms. Okoli is a 53 year old right-handed black female with a history of intractable migraine headache associated with severe nausea and vertigo.  The patient has been on propranolol taking 40 mg twice daily, she still has about 3 headache days a week.  She claims that she is not missing much work, she tries to work through the headache.  She does get a lot of nausea and vertigo with the headache, she takes meclizine for this with some benefit.  The patient could not tolerate Topamax or Zonegran previously.  She is on Keppra for her seizures, she tolerates the drug well, she has not had any recurrence of seizures.  She returns for an evaluation.  Past Medical History:  Diagnosis Date  . Asthma   . Common migraine with intractable migraine 08/08/2016  . Enlarged ovary    right CT 05/2014  . Hypertension   . Hyperthyroidism   . Seizure disorder ([place]) 08/08/2016  . Seizures ([place])   . 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 Vertigo     Past Surgical History:  Procedure Laterality Date  . ABDOMINAL HYSTERECTOMY    . BALLOON DILATION N/A 05/26/2014   Procedure: BALLOON DILATION OF THE PYLORUS;  Surgeon: Danie Binder, MD;  [facility];  Service: Endoscopy;  Laterality: N/A;  . ESOPHAGOGASTRODUODENOSCOPY N/A 05/26/2014   Procedure: ESOPHAGOGASTRODUODENOSCOPY (EGD);  Surgeon: Danie Binder, MD;  [facility];  Service: Endoscopy;  Laterality: N/A;  . OVARIAN CYST SURGERY      Family History  Problem Relation Age of Onset  . Diabetes Mother   . Hypertension Mother   . Thyroid disease Mother   . Seizures Mother   . Diabetes Father   . Hypertension Father   . Thyroid disease Sister   . Thyroid disease Brother   . Seizures Brother   . Cancer Maternal Aunt        ovarian    Social history:  reports that she has never smoked. She has never used smokeless  tobacco. She reports that she does not drink alcohol or use drugs.    Allergies  Allergen Reactions  . Topamax [Topiramate]     Nausea and vomiting  . Contrast Media [Iodinated Diagnostic Agents] Rash  . Iohexol Nausea And Vomiting    Pt. Vomited immediately after injection of Omni 350.    . Vicodin [Hydrocodone-Acetaminophen] Rash    Tolerates plain Tylenol    Medications:  Prior to Admission medications   Medication Sig Start Date End Date Taking? Authorizing Provider  COD LIVER OIL PO Take 2 tablets by mouth daily.   Yes [provider]  estradiol (CLIMARA - DOSED IN MG/24 HR) 0.05 mg/24hr patch APPLY ONCE A WEEK 02/14/18  Yes [provider]  Iron Combinations (CHROMAGEN) capsule Take 1 capsule by mouth daily.   Yes [provider]  levETIRAcetam (KEPPRA) 250 MG tablet Take 1 tablet (250 mg total) by mouth 2 (two) times daily. 11/11/18  Yes Kathrynn Ducking, MD  levothyroxine (SYNTHROID, LEVOTHROID) 88 MCG tablet Take 1 tablet (88 mcg total) by mouth daily before breakfast. 06/25/18  Yes Elayne Snare, MD  lisinopril (ZESTRIL) 10 MG tablet Take 10 mg by mouth daily. 10/30/18  Yes [provider]  meclizine (ANTIVERT) 25 MG tablet Take 1 tablet (25 mg total) by mouth 3 (three) times daily as needed for  dizziness. 11/28/18  Yes Suzzanne Cloud, NP  Multiple Vitamin (MULTIVITAMIN WITH MINERALS) TABS tablet Take 1 tablet by mouth daily.   Yes [provider]  propranolol (INDERAL) 20 MG tablet Take 2 tablets (40 mg total) by mouth 2 (two) times daily. 12/10/18  Yes Kathrynn Ducking, MD    ROS:  Out of a complete 14 system review of symptoms, the patient complains only of the following symptoms, and all other reviewed systems are negative.  Headache Vertigo, nausea  Blood pressure (!) 140/94, pulse 62, temperature (!) 97.4 F (36.3 C), weight 158 lb (71.7 kg).  Physical Exam  General: The patient is alert and cooperative at the time of the  examination.  Skin: No significant peripheral edema is noted.   Neurologic Exam  Mental status: The patient is alert and oriented x 3 at the time of the examination. The patient has apparent normal recent and remote memory, with an apparently normal attention span and concentration ability.   Cranial nerves: Facial symmetry is present. Speech is normal, no aphasia or dysarthria is noted. Extraocular movements are full. Visual fields are full.  Motor: The patient has good strength in all 4 extremities.  Sensory examination: Soft touch sensation is symmetric on the face, arms, and legs.  Coordination: The patient has good finger-nose-finger and heel-to-shin bilaterally.  Gait and station: The patient has a normal gait. Tandem gait is normal. Romberg is negative. No drift is seen.  Reflexes: Deep tendon reflexes are symmetric.   Assessment/Plan:  1.  Intractable migraine headache  2.  Seizures, well controlled  The patient will remain on Keppra for now.  The patient will be increased on the propranolol taking 120 mg LA capsule, one daily.  The patient will call me in about 4 weeks if she is not doing well, we will add nortriptyline to the regimen.  If these medications fail, we will try Aimovig.  She will follow-up here in 3 months.  Jill Alexanders MD 03/05/2019 9:30 AM  [facility] [address] [place], [place] [address]  Phone [phone] Fax [phone]

## 2019-03-29 ENCOUNTER — Other Ambulatory Visit: Payer: Self-pay | Admitting: Neurology

## 2019-05-31 ENCOUNTER — Other Ambulatory Visit: Payer: Self-pay | Admitting: Neurology

## 2019-06-12 ENCOUNTER — Encounter: Payer: Self-pay | Admitting: Neurology

## 2019-06-12 ENCOUNTER — Other Ambulatory Visit: Payer: Self-pay

## 2019-06-12 ENCOUNTER — Ambulatory Visit: Payer: 59 | Admitting: Neurology

## 2019-06-12 VITALS — BP 158/105 | HR 80 | Ht 64.0 in | Wt 157.0 lb

## 2019-06-12 DIAGNOSIS — G40909 Epilepsy, unspecified, not intractable, without status epilepticus: Secondary | ICD-10-CM | POA: Diagnosis not present

## 2019-06-12 DIAGNOSIS — G43019 Migraine without aura, intractable, without status migrainosus: Secondary | ICD-10-CM | POA: Diagnosis not present

## 2019-06-12 MED ORDER — PROPRANOLOL HCL 20 MG PO TABS: 40.0000 mg | ORAL_TABLET | Freq: Two times a day (BID) | ORAL | 1 refills | Status: AC

## 2019-06-12 MED ORDER — NORTRIPTYLINE HCL 10 MG PO CAPS: ORAL_CAPSULE | ORAL | 5 refills | Status: DC

## 2019-06-12 MED ORDER — MECLIZINE HCL 25 MG PO TABS: 25.0000 mg | ORAL_TABLET | Freq: Three times a day (TID) | ORAL | 5 refills | Status: AC | PRN

## 2019-06-12 NOTE — Patient Instructions (Signed)
Start nortriptyline increasing to 20 mg at bedtime  Continue Propranolol Continue Meclizine as needed

## 2019-06-12 NOTE — Progress Notes (Signed)
I have read the note, and I agree with the clinical assessment and plan.  Aymen Widrig K Michale Weikel   

## 2019-06-12 NOTE — Progress Notes (Signed)
PATIENT: [name] DOB: [birth year]  REASON FOR VISIT: follow up HISTORY FROM: patient  HISTORY OF PRESENT ILLNESS: Today 06/12/19  [name] is a 54 year old female with history of intractable migraine headache associated with severe nausea and vertigo and seizures.  She is on Keppra for seizures, has not had recurrent seizure.  When last seen the Inderal was increased to 120 mg LA, she could not tolerate the capsule due to nausea, went back to taking propanolol 40 mg twice a day tablet.  She has not seen much change in her headache.  She is under significant stress today, her daughter is in labor with twins in [place].  She is going to be moving to [place] in May 2021, she will be establishing with a new neurologist.  She continues to report about 3 migraines per week.  She describes the headaches as like a pressure, like a halo.  She checks her blood pressures at home, usually runs around 120/80.  She presents today for evaluation unaccompanied.  HISTORY 03/05/2019 Dr. Jannifer Franklin: [name] is a 54 year old right-handed black female with a history of intractable migraine headache associated with severe nausea and vertigo.  The patient has been on propranolol taking 40 mg twice daily, she still has about 3 headache days a week.  She claims that she is not missing much work, she tries to work through the headache.  She does get a lot of nausea and vertigo with the headache, she takes meclizine for this with some benefit.  The patient could not tolerate Topamax or Zonegran previously.  She is on Keppra for her seizures, she tolerates the drug well, she has not had any recurrence of seizures.  She returns for an evaluation.   REVIEW OF SYSTEMS: Out of a complete 14 system review of symptoms, the patient complains only of the following symptoms, and all other reviewed systems are negative.  Seizure, headache  ALLERGIES: Allergies  Allergen Reactions  . Topamax [Topiramate]     Nausea and  vomiting  . Contrast Media [Iodinated Diagnostic Agents] Rash  . Iohexol Nausea And Vomiting    Pt. Vomited immediately after injection of Omni 350.    . Vicodin [Hydrocodone-Acetaminophen] Rash    Tolerates plain Tylenol    HOME MEDICATIONS: Outpatient Medications Prior to Visit  Medication Sig Dispense Refill  . COD LIVER OIL PO Take 2 tablets by mouth daily.    Marland Kitchen estradiol (CLIMARA - DOSED IN MG/24 HR) 0.05 mg/24hr patch APPLY ONCE A WEEK  2  . Iron Combinations (CHROMAGEN) capsule Take 1 capsule by mouth daily.    Marland Kitchen levETIRAcetam (KEPPRA) 250 MG tablet Take 1 tablet (250 mg total) by mouth 2 (two) times daily. 180 tablet 3  . levothyroxine (SYNTHROID, LEVOTHROID) 88 MCG tablet Take 1 tablet (88 mcg total) by mouth daily before breakfast. 90 tablet 3  . lisinopril (ZESTRIL) 10 MG tablet Take 10 mg by mouth daily.    . Multiple Vitamin (MULTIVITAMIN WITH MINERALS) TABS tablet Take 1 tablet by mouth daily.    . meclizine (ANTIVERT) 25 MG tablet Take 1 tablet (25 mg total) by mouth 3 (three) times daily as needed for dizziness. 30 tablet 5  . propranolol ER (INDERAL LA) 120 MG 24 hr capsule TAKE 1 CAPSULE BY MOUTH EVERY DAY 90 capsule 1   No facility-administered medications prior to visit.    PAST MEDICAL HISTORY: Past Medical History:  Diagnosis Date  . Asthma   . Common migraine with intractable migraine  08/08/2016  . Enlarged ovary    right CT 05/2014  . Hypertension   . Hyperthyroidism   . Seizure disorder ([place]) 08/08/2016  . Seizures ([place])   . 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 Vertigo     PAST SURGICAL HISTORY: Past Surgical History:  Procedure Laterality Date  . ABDOMINAL HYSTERECTOMY    . BALLOON DILATION N/A 05/26/2014   Procedure: BALLOON DILATION OF THE PYLORUS;  Surgeon: Danie Binder, MD;  [facility];  Service: Endoscopy;  Laterality: N/A;  . ESOPHAGOGASTRODUODENOSCOPY N/A 05/26/2014   Procedure: ESOPHAGOGASTRODUODENOSCOPY (EGD);  Surgeon: Danie Binder, MD;   Location: [facility];  Service: Endoscopy;  Laterality: N/A;  . OVARIAN CYST SURGERY      FAMILY HISTORY: Family History  Problem Relation Age of Onset  . Diabetes Mother   . Hypertension Mother   . Thyroid disease Mother   . Seizures Mother   . Diabetes Father   . Hypertension Father   . Thyroid disease Sister   . Thyroid disease Brother   . Seizures Brother   . Cancer Maternal Aunt        ovarian    SOCIAL HISTORY: Social History   Socioeconomic History  . Marital status: Married    Spouse name: Not on file  . Number of children: 2  . Years of education: HS  . Highest education level: Not on file  Occupational History  . Not on file  Tobacco Use  . Smoking status: Never Smoker  . Smokeless tobacco: Never Used  Substance and Sexual Activity  . Alcohol use: No  . Drug use: No  . Sexual activity: Yes    Birth control/protection: Surgical  Other Topics Concern  . Not on file  Social History Narrative   Caffeine: 1-2 times a week    Social Determinants of Health   Financial Resource Strain:   . Difficulty of Paying Living Expenses: Not on file  Food Insecurity:   . Worried About Charity fundraiser in the Last Year: Not on file  . Ran Out of Food in the Last Year: Not on file  Transportation Needs:   . Lack of Transportation (Medical): Not on file  . Lack of Transportation (Non-Medical): Not on file  Physical Activity:   . Days of Exercise per Week: Not on file  . Minutes of Exercise per Session: Not on file  Stress:   . Feeling of Stress : Not on file  Social Connections:   . Frequency of Communication with Friends and Family: Not on file  . Frequency of Social Gatherings with Friends and Family: Not on file  . Attends Religious Services: Not on file  . Active Member of Clubs or Organizations: Not on file  . Attends Archivist Meetings: Not on file  . Marital Status: Not on file  Intimate Partner Violence:   . Fear of Current or  Ex-Partner: Not on file  . Emotionally Abused: Not on file  . Physically Abused: Not on file  . Sexually Abused: Not on file   PHYSICAL EXAM  Vitals:   06/12/19 0938  BP: (!) 158/105  Pulse: 80  Weight: 157 lb (71.2 kg)  Height: 5\' 4"  (1.626 m)   Body mass index is 26.95 kg/m.  Generalized: Well developed, in no acute distress   Neurological examination  Mentation: Alert oriented to time, place, history taking. Follows all commands speech and language fluent Cranial nerve II-XII: Pupils were equal round reactive to  light. Extraocular movements were full, visual field were full on confrontational test. Facial sensation and strength were normal. Head turning and shoulder shrug were normal and symmetric. Motor: The motor testing reveals 5 over 5 strength of all 4 extremities. Good symmetric motor tone is noted throughout.  Sensory: Sensory testing is intact to soft touch on all 4 extremities. No evidence of extinction is noted.  Coordination: Cerebellar testing reveals good finger-nose-finger and heel-to-shin bilaterally.  Gait and station: Gait is normal. Tandem gait is normal. Romberg is negative. No drift is seen.  Reflexes: Deep tendon reflexes are symmetric and normal bilaterally.   DIAGNOSTIC DATA (LABS, IMAGING, TESTING) - I reviewed patient records, labs, notes, testing and imaging myself where available.  Lab Results  Component Value Date   WBC 7.1 02/28/2018   HGB 13.0 02/28/2018   HCT 39.5 02/28/2018   MCV 90.4 02/28/2018   PLT 191 02/28/2018      Component Value Date/Time   NA 140 02/28/2018 1814   K 4.1 02/28/2018 1814   CL 112 (H) 02/28/2018 1814   CO2 21 (L) 02/28/2018 1814   GLUCOSE 97 02/28/2018 1814   BUN 12 02/28/2018 1814   CREATININE 0.78 02/28/2018 1814   CALCIUM 9.7 02/28/2018 1814   CALCIUM 9.5 05/23/2014 1709   PROT 7.8 02/28/2018 1814   ALBUMIN 4.3 02/28/2018 1814   AST 15 02/28/2018 1814   ALT 12 02/28/2018 1814   ALKPHOS 70 02/28/2018  1814   BILITOT 0.5 02/28/2018 1814   GFRNONAA >60 02/28/2018 1814   GFRAA >60 02/28/2018 1814   Lab Results  Component Value Date   CHOL 118 06/02/2014   HDL 20 (L) 06/02/2014   LDLCALC 85 06/02/2014   TRIG 63 06/02/2014   CHOLHDL 5.9 06/02/2014   Lab Results  Component Value Date   HGBA1C 5.7 09/10/2015   Lab Results  Component Value Date   VITAMINB12 237 02/19/2018   Lab Results  Component Value Date   TSH 2.65 12/26/2018    ASSESSMENT AND PLAN 54 y.o. year old female  has a past medical history of Asthma, Common migraine with intractable migraine (08/08/2016), Enlarged ovary, Hypertension, Hyperthyroidism, Seizure disorder ([place]) (08/08/2016), Seizures ([place]), Thyroid storm (06/09/2014), and Vertigo. here with:  1.  Intractable migraine headache 2.  Seizures, well controlled  She continues to report a 3 migraines a week, associated with nausea, and dizziness.  I will start nortriptyline, slow increase to 20 mg at bedtime.  She will remain on propanolol, cannot tolerate the Inderal LA capsules.  She will take propanolol 40 mg tablets twice a day.  I will send in a refill for meclizine as needed for dizziness.  She will remain on Keppra.  She is going to be establishing with another neurologist in [place], she is moving in May.  Blood pressure was somewhat elevated today, she will monitor at home.  If headaches do not improve, we would consider the addition of CGRP.  She will let me know how she is doing in a couple weeks, she will follow-up here as needed, she is going to be relocating in about 2 months.   I spent 15 minutes with the patient. 50% of this time was spent discussing her plan of care.   Butler Denmark, AGNP-C, DNP 06/12/2019, 10:24 AM [facility] [address] [place], [place] [address] [phone]

## 2019-06-24 ENCOUNTER — Ambulatory Visit: Payer: 59 | Admitting: Endocrinology

## 2019-07-03 ENCOUNTER — Other Ambulatory Visit: Payer: Self-pay | Admitting: Endocrinology

## 2019-07-05 ENCOUNTER — Other Ambulatory Visit: Payer: Self-pay | Admitting: Neurology

## 2019-07-25 ENCOUNTER — Other Ambulatory Visit: Payer: Self-pay

## 2019-07-27 ENCOUNTER — Emergency Department (HOSPITAL_COMMUNITY)
Admission: EM | Admit: 2019-07-27 | Discharge: 2019-07-27 | Disposition: A | Discharge status: Home / Self Care | Payer: 59 | Attending: Emergency Medicine | Admitting: Emergency Medicine

## 2019-07-27 ENCOUNTER — Other Ambulatory Visit: Payer: Self-pay

## 2019-07-27 ENCOUNTER — Encounter (HOSPITAL_COMMUNITY): Payer: Self-pay | Admitting: Emergency Medicine

## 2019-07-27 DIAGNOSIS — I10 Essential (primary) hypertension: Secondary | ICD-10-CM | POA: Diagnosis present

## 2019-07-27 DIAGNOSIS — R42 Dizziness and giddiness: Secondary | ICD-10-CM | POA: Diagnosis not present

## 2019-07-27 DIAGNOSIS — R519 Headache, unspecified: Secondary | ICD-10-CM | POA: Insufficient documentation

## 2019-07-27 MED ORDER — AMLODIPINE BESYLATE 5 MG PO TABS: 5.0000 mg | ORAL_TABLET | Freq: Every day | ORAL | 0 refills | Status: AC

## 2019-07-27 NOTE — Discharge Instructions (Signed)
I started you on a second blood pressure medication called Amlodipine, 5 mg tablets.    Check your blood pressure every morning when you wake up and keep a daily log.  If your morning blood pressure is greater than [id number] mmhg (the systolic, or top number), take your lisinopril and the amlodipine.  If your morning blood pressure is less than 150 mm hg (the systolic, or top number), take only your lisinopril.  Please follow-up with your primary care doctor's office tomorrow for this.  You should always discuss any changes in your medications including your blood pressure medicine with your primary care clinic.  They may wish to switch you to a different medication.

## 2019-07-27 NOTE — ED Triage Notes (Signed)
Followed by Dr Alroy Dust in [place] for HTN  At work, came home for lunch and checked her BP on her machine   Noticed "highest it has ever been"  Denies pain   Has not missed meds

## 2019-07-27 NOTE — ED Provider Notes (Signed)
[facility] EMERGENCY DEPARTMENT Provider Note   CSN: [id number] Arrival date & time: 07/27/19  1532     History Chief Complaint  Patient presents with  . Hypertension    [name] is a 54 y.o. female history of hypothyroidism on Synthroid, hypertension, chronic headaches and vertigo, presented to emergency department with high blood pressure.  Patient reports that she was checking her blood pressure today noted is quite high.  She noted her pressure was 190/120 mm hg this afternoon.  She has been experiencing some intermittent headaches for the past several days associated with mild vertigo and nausea, which she attributed to her chronic migraines, as this is her typical presentation.  However when she noted that her blood pressure was this high, she and her husband decided to come to the emergency department to get checked out.  Her husband reports that he read online that she should come to the emergency department "if the top number is over 180 mmhg or the bottom number is over 120 mm hg."    Since arriving the emergency department, the patient feels back to baseline.  She denies any persistent headache or lightheadedness or dizziness.  She denies any blurred vision.  She denies any chest pain or shortness of breath.  She does report to me that she feels like she may have increased her salt intake this week, and she had several meals in a row with ground beef and [place], which is unusual for her.  Normally says she eats very healthy.  She does not smoke.  She does have a strong family history of hypertension.  She reports that there are multiple new stressors in her life including that they are moving to [place] soon.  She also tells me that her blood pressure has been creeping up over the past several weeks or months.  She does not check it daily.  However her PCP recently increased her lisinopril dose from 5 mg to 10 mg daily about 1 month ago.  She has not been back to see them  since.  HPI     Past Medical History:  Diagnosis Date  . Asthma   . Common migraine with intractable migraine 08/08/2016  . Enlarged ovary    right CT 05/2014  . Hypertension   . Hyperthyroidism   . Seizure disorder ([place]) 08/08/2016  . Seizures ([place])   . 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 Vertigo     Patient Active Problem List   Diagnosis Date Noted  . Dysarthria 02/19/2018  . Sensory disturbance 02/19/2018  . Other complicated headache syndrome   . Complicated migraine [id number]  . Stroke-like episode 02/18/2018  . Seizure disorder ([place]) 08/08/2016  . Common migraine with intractable migraine 08/08/2016  . 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 Muscle cramp 01/13/2015  . Hyperglycemia, drug-induced 06/10/2014  . Anemia due to other cause 06/10/2014  . Hypokalemia 06/10/2014  . Malnutrition of moderate degree ([place]) 06/02/2014  . Elevated d-dimer   . Altered mental status 06/01/2014  . Chest pain 06/01/2014  . Generalized weakness   . Enlarged ovary 05/28/2014  . Weight loss   . UTI (urinary tract infection) 05/26/2014  . Abdominal pain   . Fever   . Nausea with vomiting   . Nausea & vomiting 05/23/2014  . Pancreatitis 05/23/2014  . Essential hypertension 05/23/2014  . Rapid weight loss 05/23/2014  . Tachycardia 05/23/2014  . Dehydration 05/23/2014  . GERD (gastroesophageal reflux disease) 05/23/2014  . Generalized abdominal pain  Past Surgical History:  Procedure Laterality Date  . ABDOMINAL HYSTERECTOMY    . BALLOON DILATION N/A 05/26/2014   Procedure: BALLOON DILATION OF THE PYLORUS;  Surgeon: Danie Binder, MD;  [facility];  Service: Endoscopy;  Laterality: N/A;  . ESOPHAGOGASTRODUODENOSCOPY N/A 05/26/2014   Procedure: ESOPHAGOGASTRODUODENOSCOPY (EGD);  Surgeon: Danie Binder, MD;  [facility];  Service: Endoscopy;  Laterality: N/A;  . OVARIAN CYST SURGERY       OB History    Gravida  4   Para  2   Term  2     Preterm      AB  2   Living        SAB  2   TAB      Ectopic      Multiple      Live Births              Family History  Problem Relation Age of Onset  . Diabetes Mother   . Hypertension Mother   . Thyroid disease Mother   . Seizures Mother   . Diabetes Father   . Hypertension Father   . Thyroid disease Sister   . Thyroid disease Brother   . Seizures Brother   . Cancer Maternal Aunt        ovarian    Social History   Tobacco Use  . Smoking status: Never Smoker  . Smokeless tobacco: Never Used  Substance Use Topics  . Alcohol use: No  . Drug use: No    Home Medications Prior to Admission medications   Medication Sig Start Date End Date Taking? Authorizing Provider  amLODipine (NORVASC) 5 MG tablet Take 1 tablet (5 mg total) by mouth daily for 60 doses. 07/27/19 09/25/19  Wyvonnia Dusky, MD  COD LIVER OIL PO Take 2 tablets by mouth daily.    [provider]  estradiol (CLIMARA - DOSED IN MG/24 HR) 0.05 mg/24hr patch APPLY ONCE A WEEK 02/14/18   [provider]  Iron Combinations (CHROMAGEN) capsule Take 1 capsule by mouth daily.    [provider]  levETIRAcetam (KEPPRA) 250 MG tablet Take 1 tablet (250 mg total) by mouth 2 (two) times daily. 11/11/18   Kathrynn Ducking, MD  levothyroxine (SYNTHROID) 88 MCG tablet TAKE 1 TABLET BY MOUTH DAILY BEFORE BREAKFAST. 07/03/19   Elayne Snare, MD  lisinopril (ZESTRIL) 10 MG tablet Take 10 mg by mouth daily. 10/30/18   [provider]  meclizine (ANTIVERT) 25 MG tablet Take 1 tablet (25 mg total) by mouth 3 (three) times daily as needed for dizziness. 06/12/19   Suzzanne Cloud, NP  Multiple Vitamin (MULTIVITAMIN WITH MINERALS) TABS tablet Take 1 tablet by mouth daily.    [provider]  nortriptyline (PAMELOR) 10 MG capsule TAKE 1 AT BEDTIME X 1 WEEK, THEN TAKE 2 AT BEDTIME 07/07/19   Suzzanne Cloud, NP  propranolol (INDERAL) 20 MG tablet Take 2 tablets (40 mg total) by mouth 2  (two) times daily. 06/12/19   Suzzanne Cloud, NP    Allergies    Topamax [topiramate], Contrast media [iodinated diagnostic agents], Iohexol, and Vicodin [hydrocodone-acetaminophen]  Review of Systems   Review of Systems  Constitutional: Negative for chills and fever.  HENT: Negative for ear pain and sore throat.   Eyes: Negative for pain and visual disturbance.  Respiratory: Negative for cough and shortness of breath.   Cardiovascular: Negative for chest pain and palpitations.  Gastrointestinal:  Negative for abdominal pain and vomiting.  Genitourinary: Negative for dysuria and hematuria.  Musculoskeletal: Negative for arthralgias and back pain.  Skin: Negative for color change and rash.  Neurological: Positive for dizziness and headaches. Negative for syncope.  Psychiatric/Behavioral: Negative for agitation and confusion.  All other systems reviewed and are negative.   Physical Exam Updated Vital Signs BP (!) 181/94   Pulse 63   Resp 15   Ht 5\' 4"  (1.626 m)   Wt 66.2 kg   SpO2 100%   BMI 25.06 kg/m   Physical Exam Vitals and nursing note reviewed.  Constitutional:      General: She is not in acute distress.    Appearance: She is well-developed.  HENT:     Head: Normocephalic and atraumatic.  Eyes:     Extraocular Movements: Extraocular movements intact.     Conjunctiva/sclera: Conjunctivae normal.     Pupils: Pupils are equal, round, and reactive to light.  Cardiovascular:     Rate and Rhythm: Normal rate and regular rhythm.     Pulses: Normal pulses.     Heart sounds: No murmur.  Pulmonary:     Effort: Pulmonary effort is normal. No respiratory distress.     Breath sounds: Normal breath sounds.  Abdominal:     Palpations: Abdomen is soft.     Tenderness: There is no abdominal tenderness.  Musculoskeletal:     Cervical back: Neck supple.  Skin:    General: Skin is warm and dry.  Neurological:     General: No focal deficit present.     Mental Status: She is  alert and oriented to person, place, and time. Mental status is at baseline.     Cranial Nerves: No cranial nerve deficit.     Coordination: Coordination normal.     Gait: Gait normal.  Psychiatric:        Mood and Affect: Mood normal.        Behavior: Behavior normal.     ED Results / Procedures / Treatments   Labs (all labs ordered are listed, but only abnormal results are displayed) Labs Reviewed - No data to display  EKG EKG Interpretation  Date/Time:  Sunday July 27 2019 15:58:20 EDT Ventricular Rate:  61 PR Interval:    QRS Duration: 96 QT Interval:  425 QTC Calculation: 429 R Axis:   34 Text Interpretation: Sinus rhythm No STEMI Confirmed by Octaviano Glow [phone]) on 07/27/2019 4:00:06 PM   Radiology No results found.  Procedures Procedures (including critical care time)  Medications Ordered in ED Medications - No data to display  ED Course  I have reviewed the triage vital signs and the nursing notes.  Pertinent labs & imaging results that were available during my care of the patient were reviewed by me and considered in my medical decision making (see chart for details).  Is a 54 year old female presented to emergency department currently with asymptomatic hypertension.  She was concerned that her blood pressure was abnormally high today at 190/120 mmhg, and she was experiencing recurrence of her headache.  She does follow with a neurologist for his headaches and takes propranolol for them.  She gets them often.  Here in the ED her blood pressure is 180/90.  She is a completely benign physical exam including neurological exam, cardiac respiratory exam.  Her EKG shows normal sinus rhythm with no ischemic changes.  A very low suspicion for hypertensive emergency at this time.  We discussed the possibility that her blood  pressure may have been driven up with the stressors in her life as well as increased salt intake this week with all the ground beef has been  eating.  Given that she does have a blood pressure cuff at home, I felt it was reasonable to start her on a small dose of amlodipine, the secondary antihypertensive.  I gave them instructions to discharge as to when to take this medication.  I also advised them to call their PCP's office tomorrow to discuss these changes.     Final Clinical Impression(s) / ED Diagnoses Final diagnoses:  Hypertension, unspecified type    Rx / DC Orders ED Discharge Orders         Ordered    amLODipine (NORVASC) 5 MG tablet  Daily     07/27/19 1622           Wyvonnia Dusky, MD 07/27/19 [phone]

## 2019-07-29 ENCOUNTER — Encounter: Payer: Self-pay | Admitting: Endocrinology

## 2019-07-29 ENCOUNTER — Other Ambulatory Visit: Payer: Self-pay

## 2019-07-29 ENCOUNTER — Ambulatory Visit: Payer: 59 | Admitting: Endocrinology

## 2019-07-29 VITALS — BP 134/80 | HR 63 | Ht 64.0 in | Wt 155.2 lb

## 2019-07-29 DIAGNOSIS — E890 Postprocedural hypothyroidism: Secondary | ICD-10-CM | POA: Diagnosis not present

## 2019-07-29 LAB — T4, FREE: Free T4: 1.7 ng/dL — ABNORMAL HIGH (ref 0.60–1.60)

## 2019-07-29 LAB — TSH: TSH: 0.52 u[IU]/mL (ref 0.35–4.50)

## 2019-07-29 NOTE — Progress Notes (Signed)
Patient ID: [name], female   DOB: [birth year], 54 y.o.   MRN: [id number]            Reason for Appointment:  Hypothyroidism, follow-up visit    History of Present Illness:   Background information: She was treated for hyperthyroidism in 05/2014; this was diagnosed when she was admitted to the hospital with nausea, weight loss of 20-25 pounds and constipation.  She was then found to have significant tachycardia and Hyperthyroidism  was diagnosed She got 15 mCi in 06/2014 and subsequently became hypothyroid, details of when she first went on thyroid supplements are unknown. She was initially treated with 75 g of levothyroxine Subsequently been TSH was 11 this was increased to 112 g which however brought her TSH down to 0.11 She was then started on 100 g of levothyroxine In 04/2015 she had an episode of syncope along with symptoms of nausea and was evaluated in an emergency room in [place] where she was found to have a TSH of 0.15 Subsequently had been on 50 g of levothyroxine which caused her to have significant fatigue, nausea and dizziness  RECENT history: Previously her TSH was 25.7 her Synthroid was increased from 50 up to 88 g in  05/2015 Her dose was reduced to 75 g in 07/2016 Levothyroxine was increased by half a tablet weekly in 10/18 when her TSH was 5 Subsequently has been taking between 75 and 88 mcg  She is taking 88 mcg 1 tablet daily since 04/2018  She has had some intercurrent medical issues and tend to get tired especially if she has difficulty sleeping No hair loss, heat or cold intolerance or significant weight change  She is very consistent with taking her levothyroxine in the morning 30 minutes before breakfast   She takes her vitamins or iron later in the day  She has not been out of her medications are missing any doses  Last TSH normal and the dose was continued unchanged  Patient's weight history is as follows:  Wt Readings from Last 3 Encounters:    07/29/19 155 lb 3.2 oz (70.4 kg)  07/27/19 146 lb (66.2 kg)  06/12/19 157 lb (71.2 kg)    Thyroid function results as follows:  Lab Results  Component Value Date   TSH 2.65 12/26/2018   TSH 0.74 06/25/2018   TSH 6.12 (H) 04/29/2018   FREET4 1.38 12/26/2018   FREET4 1.05 06/25/2018   FREET4 0.79 04/29/2018    Lab Results  Component Value Date   TSH 2.65 12/26/2018   TSH 0.74 06/25/2018   TSH 6.12 (H) 04/29/2018   TSH 1.469 02/19/2018   TSH 0.49 10/26/2017   TSH 0.65 04/27/2017   TSH 5.03 (H) 01/25/2017   TSH 0.42 07/21/2016   TSH 1.86 01/17/2016     Past Medical History:  Diagnosis Date  . Asthma   . Common migraine with intractable migraine 08/08/2016  . Enlarged ovary    right CT 05/2014  . Hypertension   . Hyperthyroidism   . Seizure disorder ([place]) 08/08/2016  . Seizures ([place])   . 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 Vertigo     Past Surgical History:  Procedure Laterality Date  . ABDOMINAL HYSTERECTOMY    . BALLOON DILATION N/A 05/26/2014   Procedure: BALLOON DILATION OF THE PYLORUS;  Surgeon: Danie Binder, MD;  Location: [facility];  Service: Endoscopy;  Laterality: N/A;  . ESOPHAGOGASTRODUODENOSCOPY N/A 05/26/2014   Procedure: ESOPHAGOGASTRODUODENOSCOPY (EGD);  Surgeon: Danie Binder, MD;  Location: [facility];  Service: Endoscopy;  Laterality: N/A;  . OVARIAN CYST SURGERY      Family History  Problem Relation Age of Onset  . Diabetes Mother   . Hypertension Mother   . Thyroid disease Mother   . Seizures Mother   . Diabetes Father   . Hypertension Father   . Thyroid disease Sister   . Thyroid disease Brother   . Seizures Brother   . Cancer Maternal Aunt        ovarian    Social History:  reports that she has never smoked. She has never used smokeless tobacco. She reports that she does not drink alcohol or use drugs.  Allergies:  Allergies  Allergen Reactions  . Topamax [Topiramate]     Nausea and vomiting  . Contrast Media [Iodinated  Diagnostic Agents] Rash  . Iohexol Nausea And Vomiting    Pt. Vomited immediately after injection of Omni 350.    . Vicodin [Hydrocodone-Acetaminophen] Rash    Tolerates plain Tylenol    Allergies as of 07/29/2019      Reactions   Topamax [topiramate]    Nausea and vomiting   Contrast Media [iodinated Diagnostic Agents] Rash   Iohexol Nausea And Vomiting   Pt. Vomited immediately after injection of Omni 350.     Vicodin [hydrocodone-acetaminophen] Rash   Tolerates plain Tylenol      Medication List       Accurate as of July 29, 2019 10:08 AM. If you have any questions, ask your nurse or doctor.        amLODipine 5 MG tablet Commonly known as: NORVASC Take 1 tablet (5 mg total) by mouth daily for 60 doses.   chromagen capsule Take 1 capsule by mouth daily.   COD LIVER OIL PO Take 2 tablets by mouth daily.   estradiol 0.05 mg/24hr patch Commonly known as: CLIMARA - Dosed in mg/24 hr APPLY ONCE A WEEK   levETIRAcetam 250 MG tablet Commonly known as: Keppra Take 1 tablet (250 mg total) by mouth 2 (two) times daily.   levothyroxine 88 MCG tablet Commonly known as: SYNTHROID TAKE 1 TABLET BY MOUTH DAILY BEFORE BREAKFAST.   lisinopril 10 MG tablet Commonly known as: ZESTRIL Take 10 mg by mouth daily.   meclizine 25 MG tablet Commonly known as: ANTIVERT Take 1 tablet (25 mg total) by mouth 3 (three) times daily as needed for dizziness.   multivitamin with minerals Tabs tablet Take 1 tablet by mouth daily.   nortriptyline 10 MG capsule Commonly known as: PAMELOR TAKE 1 AT BEDTIME X 1 WEEK, THEN TAKE 2 AT BEDTIME   propranolol 20 MG tablet Commonly known as: INDERAL Take 2 tablets (40 mg total) by mouth 2 (two) times daily.          Review of Systems    She has long-standing history of seizures, on treatment  She is having good relief of her hot flashes :being treated with HRT using Climara prescribed by gynecologist  Hypertension: Followed by PCP  and recently has had some fluctuations  BP Readings from Last 3 Encounters:  07/29/19 134/80  07/27/19 (!) 181/94  06/12/19 (!) 158/105                Examination:    BP 134/80 (BP Location: Left Arm, Patient Position: Sitting, Cuff Size: Normal)   Pulse 63   Ht 5\' 4"  (1.626 m)   Wt 155 lb 3.2 oz (70.4 kg)   SpO2 98%  BMI 26.64 kg/m   She looks well No peripheral edema   Assessment:  HYPOTHYROIDISM, secondary to radioactive iodine treatment Graves' disease in early 2016 She has been on variable doses of levothyroxine in the past  Now with taking 88 mcg 1 tablet daily her TSH level had been more consistent although labs are pending from today She is very regular with taking this every morning  Difficult to assess her thyroid levels from symptoms since she has other nonspecific problems  She is aware that she will not take her vitamins or iron at the same time as her levothyroxine  TSH pending  Menopausal symptoms: Well controlled with Climara which she is continuing   PLAN:   Recheck thyroid levels today She will follow-up as needed, currently planning to move to [facility] 07/29/2019, 10:08 AM

## 2019-10-27 ENCOUNTER — Telehealth: Payer: Self-pay | Admitting: Endocrinology

## 2019-10-27 ENCOUNTER — Other Ambulatory Visit: Payer: Self-pay

## 2019-10-27 MED ORDER — LEVOTHYROXINE SODIUM 88 MCG PO TABS: ORAL_TABLET | ORAL | 0 refills | Status: AC

## 2019-10-27 NOTE — Telephone Encounter (Signed)
Rx sent 

## 2019-10-27 NOTE — Telephone Encounter (Signed)
Medication Refill Request  Did you call your pharmacy and request this refill first? Yes  . If patient has not contacted pharmacy first, instruct them to do so for future refills.  . Remind them that contacting the pharmacy for their refill is the quickest method to get the refill.  . Refill policy also stated that it will take anywhere between 24-72 hours to receive the refill.    Name of medication? levothyroxine  Is this a 90 day supply? yes  Name and location of pharmacy?   [facility] [address] [place] [address] Ph# - [phone]
